# Patient Record
Sex: Female | Born: 1970 | Hispanic: Yes | Marital: Married | Smoking: Never smoker
Health system: Southern US, Community
[De-identification: ages and names within clinical notes are randomized; demographics above are authoritative.]

## PROBLEM LIST (undated history)

## (undated) DIAGNOSIS — D649 Anemia, unspecified: Secondary | ICD-10-CM

## (undated) DIAGNOSIS — J45909 Unspecified asthma, uncomplicated: Secondary | ICD-10-CM

## (undated) DIAGNOSIS — R51 Headache: Secondary | ICD-10-CM

## (undated) DIAGNOSIS — F32A Depression, unspecified: Secondary | ICD-10-CM

## (undated) DIAGNOSIS — F419 Anxiety disorder, unspecified: Secondary | ICD-10-CM

## (undated) DIAGNOSIS — N946 Dysmenorrhea, unspecified: Secondary | ICD-10-CM

## (undated) DIAGNOSIS — E162 Hypoglycemia, unspecified: Secondary | ICD-10-CM

## (undated) DIAGNOSIS — I10 Essential (primary) hypertension: Secondary | ICD-10-CM

## (undated) DIAGNOSIS — T7840XA Allergy, unspecified, initial encounter: Secondary | ICD-10-CM

## (undated) HISTORY — DX: Depression, unspecified: F32.A

## (undated) HISTORY — PX: ABDOMINAL HYSTERECTOMY: SHX81

## (undated) HISTORY — PX: TUBAL LIGATION: SHX77

## (undated) HISTORY — DX: Dysmenorrhea, unspecified: N94.6

## (undated) HISTORY — DX: Allergy, unspecified, initial encounter: T78.40XA

## (undated) HISTORY — DX: Unspecified asthma, uncomplicated: J45.909

## (undated) HISTORY — DX: Anxiety disorder, unspecified: F41.9

---

## 2009-04-27 ENCOUNTER — Emergency Department (HOSPITAL_COMMUNITY): Admission: EM | Admit: 2009-04-27 | Discharge: 2009-04-27 | Payer: Self-pay | Admitting: Emergency Medicine

## 2010-07-23 ENCOUNTER — Emergency Department (HOSPITAL_COMMUNITY): Admission: EM | Admit: 2010-07-23 | Discharge: 2010-07-23 | Payer: Self-pay | Admitting: Emergency Medicine

## 2010-12-29 LAB — POCT PREGNANCY, URINE: Preg Test, Ur: NEGATIVE

## 2010-12-29 LAB — POCT I-STAT, CHEM 8
Calcium, Ion: 1.11 mmol/L — ABNORMAL LOW (ref 1.12–1.32)
HCT: 37 % (ref 36.0–46.0)
TCO2: 24 mmol/L (ref 0–100)

## 2010-12-29 LAB — GC/CHLAMYDIA PROBE AMP, GENITAL
Chlamydia, DNA Probe: NEGATIVE
GC Probe Amp, Genital: NEGATIVE

## 2010-12-29 LAB — URINALYSIS, ROUTINE W REFLEX MICROSCOPIC
Bilirubin Urine: NEGATIVE
Ketones, ur: NEGATIVE mg/dL
Nitrite: NEGATIVE
Urobilinogen, UA: 0.2 mg/dL (ref 0.0–1.0)

## 2010-12-29 LAB — WET PREP, GENITAL: Clue Cells Wet Prep HPF POC: NONE SEEN

## 2011-12-05 ENCOUNTER — Encounter (HOSPITAL_COMMUNITY): Payer: Self-pay | Admitting: *Deleted

## 2011-12-05 ENCOUNTER — Inpatient Hospital Stay (HOSPITAL_COMMUNITY): Payer: BC Managed Care – PPO

## 2011-12-05 ENCOUNTER — Inpatient Hospital Stay (HOSPITAL_COMMUNITY)
Admission: AD | Admit: 2011-12-05 | Discharge: 2011-12-05 | Disposition: A | Discharge status: Home / Self Care | Payer: BC Managed Care – PPO | Source: Ambulatory Visit | Attending: Obstetrics & Gynecology | Admitting: Obstetrics & Gynecology

## 2011-12-05 DIAGNOSIS — N938 Other specified abnormal uterine and vaginal bleeding: Secondary | ICD-10-CM | POA: Insufficient documentation

## 2011-12-05 DIAGNOSIS — N926 Irregular menstruation, unspecified: Secondary | ICD-10-CM

## 2011-12-05 DIAGNOSIS — N949 Unspecified condition associated with female genital organs and menstrual cycle: Secondary | ICD-10-CM | POA: Insufficient documentation

## 2011-12-05 DIAGNOSIS — D649 Anemia, unspecified: Secondary | ICD-10-CM | POA: Insufficient documentation

## 2011-12-05 DIAGNOSIS — R109 Unspecified abdominal pain: Secondary | ICD-10-CM | POA: Insufficient documentation

## 2011-12-05 HISTORY — DX: Anemia, unspecified: D64.9

## 2011-12-05 HISTORY — DX: Headache: R51

## 2011-12-05 LAB — URINALYSIS, ROUTINE W REFLEX MICROSCOPIC
Glucose, UA: NEGATIVE mg/dL
Ketones, ur: NEGATIVE mg/dL
Leukocytes, UA: NEGATIVE
Specific Gravity, Urine: 1.02 (ref 1.005–1.030)
pH: 6 (ref 5.0–8.0)

## 2011-12-05 LAB — URINE MICROSCOPIC-ADD ON

## 2011-12-05 LAB — CBC
Hemoglobin: 9.1 g/dL — ABNORMAL LOW (ref 12.0–15.0)
MCH: 23.6 pg — ABNORMAL LOW (ref 26.0–34.0)
RBC: 3.85 MIL/uL — ABNORMAL LOW (ref 3.87–5.11)

## 2011-12-05 LAB — POCT PREGNANCY, URINE: Preg Test, Ur: NEGATIVE

## 2011-12-05 LAB — WET PREP, GENITAL: Trich, Wet Prep: NONE SEEN

## 2011-12-05 MED ORDER — MEDROXYPROGESTERONE ACETATE 10 MG PO TABS: 10.0000 mg | ORAL_TABLET | Freq: Two times a day (BID) | ORAL | Status: DC

## 2011-12-05 MED ORDER — KETOROLAC TROMETHAMINE 60 MG/2ML IM SOLN
60.0000 mg | Freq: Once | INTRAMUSCULAR | Status: AC
  Administered 2011-12-05: 60 mg via INTRAMUSCULAR
  Filled 2011-12-05: qty 2

## 2011-12-05 MED ORDER — FERROUS GLUCONATE IRON 246 (28 FE) MG PO TABS: 246.0000 mg | ORAL_TABLET | Freq: Every day | ORAL | Status: DC

## 2011-12-05 NOTE — Progress Notes (Signed)
Patient states she started her last period on 1-24, went to see a doctor on 2-4 because still bleeding and was given Provera, Doxy and Ibuprofen 600mg . States the bleeding stopped on 2-6 then started again on 2-13. Has been changing pads every 2 hours, today every hour. Has been having bad abdominal cramping since the bleeding started.

## 2011-12-05 NOTE — ED Notes (Signed)
GYN clinic:  March 14 at 3 p.m.

## 2011-12-05 NOTE — ED Provider Notes (Signed)
History     CSN: [id number]  Arrival date & time 12/05/11  1008   None     Chief Complaint  Patient presents with  . Vaginal Bleeding   HPI Debbie [name] is a 41 y.o. female who presents to [facility]U for vaginal bleeding that started 3 weeks ago. She complains of feeling weak and dizzy and states that for the past few days she is saturating a large pad every hour. Her last pap smear was approximately 7 years ago in [place].   Past Medical History  Diagnosis Date  . Headache   . Anemia     Past Surgical History  Procedure Date  . Cesarean section   . Tubal ligation     Family History  Problem Relation Age of Onset  . Anesthesia problems Neg Hx     History  Substance Use Topics  . Smoking status: Never Smoker   . Smokeless tobacco: Never Used  . Alcohol Use: No    OB History    Grav Para Term Preterm Abortions TAB SAB Ect Mult Living   4 4 4  0 0 0 0 0 0 4      Review of Systems  Constitutional: Positive for fatigue. Negative for fever, chills and diaphoresis.  HENT: Negative for ear pain, congestion, sore throat, facial swelling, neck pain, neck stiffness, dental problem and sinus pressure.   Eyes: Negative for photophobia, pain and discharge.  Respiratory: Negative for cough, chest tightness and wheezing.   Cardiovascular: Negative.   Gastrointestinal: Positive for abdominal pain. Negative for nausea, vomiting, diarrhea, constipation and abdominal distention.  Genitourinary: Positive for vaginal bleeding and pelvic pain. Negative for dysuria, urgency, frequency, flank pain, vaginal discharge, difficulty urinating and vaginal pain.  Musculoskeletal: Negative for myalgias, back pain and gait problem.  Skin: Negative for color change and rash.  Neurological: Positive for dizziness and light-headedness. Negative for speech difficulty, weakness, numbness and headaches.  Psychiatric/Behavioral: Negative for confusion and agitation. The patient is not nervous/anxious.      Allergies  Review of patient's allergies indicates no known allergies.  Home Medications  No current outpatient prescriptions on file.  BP 132/81  Pulse 94  Temp(Src) 98.9 F (37.2 C) (Oral)  Resp 18  Ht 5' 0.5" (1.537 m)  Wt 160 lb 6.4 oz (72.757 kg)  BMI 30.81 kg/m2  SpO2 100%  LMP 11/09/2011  Physical Exam  Nursing note and vitals reviewed. Constitutional: She is oriented to person, place, and time. She appears well-developed and well-nourished. No distress.  Eyes: EOM are normal.  Neck: Neck supple.  Cardiovascular: Normal rate.   Pulmonary/Chest: Effort normal.  Abdominal: Soft. There is no tenderness.  Genitourinary:       External genitalia without lesions. Moderate blood vaginal vault. Mild CMT, bilateral adnexal tenderness. Uterus slightly enlarged, firm and irregular.  Musculoskeletal: Normal range of motion.  Neurological: She is alert and oriented to person, place, and time. No cranial nerve deficit.  Skin: Skin is warm and dry.  Psychiatric: She has a normal mood and affect. Her behavior is normal. Judgment and thought content normal.   Results for orders placed during the hospital encounter of 12/05/11 (from the past 24 hour(s))  CBC     Status: Abnormal   Collection Time   12/05/11 10:34 AM      Component Value Range   WBC 7.2  4.0 - 10.5 (K/uL)   RBC 3.85 (*) 3.87 - 5.11 (MIL/uL)   Hemoglobin 9.1 (*) 12.0 - 15.0 (g/dL)  HCT 29.7 (*) 36.0 - 46.0 (%)   MCV 77.1 (*) 78.0 - 100.0 (fL)   MCH 23.6 (*) 26.0 - 34.0 (pg)   MCHC 30.6  30.0 - 36.0 (g/dL)   RDW 33.8 (*) 25.0 - 15.5 (%)   Platelets 357  150 - 400 (K/uL)  URINALYSIS, ROUTINE W REFLEX MICROSCOPIC     Status: Abnormal   Collection Time   12/05/11 10:35 AM      Component Value Range   Color, Urine YELLOW  YELLOW    APPearance HAZY (*) CLEAR    Specific Gravity, Urine 1.020  1.005 - 1.030    pH 6.0  5.0 - 8.0    Glucose, UA NEGATIVE  NEGATIVE (mg/dL)   Hgb urine dipstick LARGE (*) NEGATIVE     Bilirubin Urine NEGATIVE  NEGATIVE    Ketones, ur NEGATIVE  NEGATIVE (mg/dL)   Protein, ur NEGATIVE  NEGATIVE (mg/dL)   Urobilinogen, UA 0.2  0.0 - 1.0 (mg/dL)   Nitrite NEGATIVE  NEGATIVE    Leukocytes, UA NEGATIVE  NEGATIVE   URINE MICROSCOPIC-ADD ON     Status: Abnormal   Collection Time   12/05/11 10:35 AM      Component Value Range   Squamous Epithelial / LPF FEW (*) RARE    RBC / HPF TOO NUMEROUS TO COUNT  <3 (RBC/hpf)  POCT PREGNANCY, URINE     Status: Normal   Collection Time   12/05/11 11:05 AM      Component Value Range   Preg Test, Ur NEGATIVE  NEGATIVE   WET PREP, GENITAL     Status: Abnormal   Collection Time   12/05/11 11:28 AM      Component Value Range   Yeast Wet Prep HPF POC NONE SEEN  NONE SEEN    Trich, Wet Prep NONE SEEN  NONE SEEN    Clue Cells Wet Prep HPF POC FEW (*) NONE SEEN    WBC, Wet Prep HPF POC NONE SEEN  NONE SEEN    US Transvaginal Non-ob  12/05/2011  *RADIOLOGY REPORT*  Clinical Data: Abnormal bleeding  TRANSABDOMINAL AND TRANSVAGINAL ULTRASOUND OF PELVIS Technique:  Both transabdominal and transvaginal ultrasound examinations of the pelvis were performed. Transabdominal technique was performed for global imaging of the pelvis including uterus, ovaries, adnexal regions, and pelvic cul-de-sac.  Comparison: None.   It was necessary to proceed with endovaginal exam following the transabdominal exam to visualize the endometrium.  Findings:  The uterus has a normal size and echotexture, measuring 11.4 x 5.0 x 5.5 cm.  Endometrial stripe is thin and homogeneous, measuring 10 mm in width.  Both ovaries have a normal size and appearance.  The right ovary measures 3.5 x 2.2 x 2.1 cm, and the left ovary measures 4.3 x 2.4 x 2.9 cm.  There are no adnexal masses or free pelvic fluid.  IMPRESSION: Normal study. No evidence of pelvic mass or other significant abnormality.  Original Report Authenticated By: Brandon Melnick, M.D.   US Pelvis Complete  12/05/2011   *RADIOLOGY REPORT*  Clinical Data: Abnormal bleeding  TRANSABDOMINAL AND TRANSVAGINAL ULTRASOUND OF PELVIS Technique:  Both transabdominal and transvaginal ultrasound examinations of the pelvis were performed. Transabdominal technique was performed for global imaging of the pelvis including uterus, ovaries, adnexal regions, and pelvic cul-de-sac.  Comparison: None.   It was necessary to proceed with endovaginal exam following the transabdominal exam to visualize the endometrium.  Findings:  The uterus has a normal size and echotexture, measuring 11.4  x 5.0 x 5.5 cm.  Endometrial stripe is thin and homogeneous, measuring 10 mm in width.  Both ovaries have a normal size and appearance.  The right ovary measures 3.5 x 2.2 x 2.1 cm, and the left ovary measures 4.3 x 2.4 x 2.9 cm.  There are no adnexal masses or free pelvic fluid.  IMPRESSION: Normal study. No evidence of pelvic mass or other significant abnormality.  Original Report Authenticated By: Brandon Melnick, M.D.   Assessment: Abnormal vaginal bleeding   Anemia  Plan:  Provera 20 mg po daily until follow up in GYN Clinic   Ibuprofen for cramping   Return as needed.  ED Course: discussed lab, u/s and clinical findings with Dr. Macon Large. Will have patient follow up in GYN Clinic for endometrial bioposy.  Procedures   MDM: Hgb 2 years ago 12.6          [facility], NP 12/05/11 1307

## 2011-12-06 LAB — GC/CHLAMYDIA PROBE AMP, GENITAL: GC Probe Amp, Genital: NEGATIVE

## 2011-12-28 ENCOUNTER — Ambulatory Visit (INDEPENDENT_AMBULATORY_CARE_PROVIDER_SITE_OTHER): Payer: BC Managed Care – PPO | Admitting: Physician Assistant

## 2011-12-28 ENCOUNTER — Other Ambulatory Visit (HOSPITAL_COMMUNITY)
Admission: RE | Admit: 2011-12-28 | Discharge: 2011-12-28 | Disposition: A | Discharge status: Home / Self Care | Payer: BC Managed Care – PPO | Source: Ambulatory Visit | Attending: Obstetrics and Gynecology | Admitting: Obstetrics and Gynecology

## 2011-12-28 ENCOUNTER — Encounter: Payer: Self-pay | Admitting: Physician Assistant

## 2011-12-28 VITALS — BP 129/82 | HR 90 | Temp 98.5°F | Ht 62.0 in | Wt 163.5 lb

## 2011-12-28 DIAGNOSIS — Z1231 Encounter for screening mammogram for malignant neoplasm of breast: Secondary | ICD-10-CM

## 2011-12-28 DIAGNOSIS — N949 Unspecified condition associated with female genital organs and menstrual cycle: Secondary | ICD-10-CM | POA: Insufficient documentation

## 2011-12-28 DIAGNOSIS — N938 Other specified abnormal uterine and vaginal bleeding: Secondary | ICD-10-CM

## 2011-12-28 DIAGNOSIS — Z01812 Encounter for preprocedural laboratory examination: Secondary | ICD-10-CM

## 2011-12-28 DIAGNOSIS — Z124 Encounter for screening for malignant neoplasm of cervix: Secondary | ICD-10-CM

## 2011-12-28 DIAGNOSIS — N939 Abnormal uterine and vaginal bleeding, unspecified: Secondary | ICD-10-CM

## 2011-12-28 DIAGNOSIS — Z01419 Encounter for gynecological examination (general) (routine) without abnormal findings: Secondary | ICD-10-CM

## 2011-12-28 DIAGNOSIS — Z1239 Encounter for other screening for malignant neoplasm of breast: Secondary | ICD-10-CM

## 2011-12-28 NOTE — Progress Notes (Signed)
Chief Complaint:  DUB  Debbie [name] is  41 y.o. [id number].  Patient's last menstrual period was 11/09/2011..  . Onset is described as ongoing and has been present for  3 years. Pt presents for endometrial biopsy and follow-up after MAU visit for heavy vaginal bleeding. Reports 3 year history of abnormally heavy bleeding 2-3 times per year that lasts from 1-2 months at a time.   Obstetrical/Gynecological History: OB History    Grav Para Term Preterm Abortions TAB SAB Ect Mult Living   4 4 4  0 0 0 0 0 0 4      Past Medical History: Past Medical History  Diagnosis Date  . Headache   . Anemia     Past Surgical History: Past Surgical History  Procedure Date  . Cesarean section   . Tubal ligation     Family History: Family History  Problem Relation Age of Onset  . Anesthesia problems Neg Hx     Social History: History  Substance Use Topics  . Smoking status: Never Smoker   . Smokeless tobacco: Never Used  . Alcohol Use: No    Allergies: No Known Allergies   (Not in a hospital admission)  Review of Systems - Negative except what has been reviewed in HPI  Physical Exam   Blood pressure 129/82, pulse 90, temperature 98.5 F (36.9 C), temperature source Oral, height 5\' 2"  (1.575 m), weight 163 lb 8 oz (74.163 kg), last menstrual period 11/09/2011.  General: General appearance - alert, well appearing, and in no distress, oriented to person, place, and time and overweight Mental status - alert, oriented to person, place, and time, normal mood, behavior, speech, dress, motor activity, and thought processes, affect appropriate to mood Abdomen - soft, nontender, nondistended, no masses or organomegaly Focused Gynecological Exam: VULVA: normal appearing vulva with no masses, tenderness or lesions, VAGINA: normal appearing vagina with normal color and discharge, no lesions, CERVIX: normal appearing cervix without discharge or lesions, UTERUS: uterus is normal size, shape,  consistency and nontender, ADNEXA: normal adnexa in size, nontender and no masses  Patient given informed consent, signed copy in the chart, time out was performed. Appropriate time out taken. . The patient was placed in the lithotomy position and the cervix brought into view with sterile speculum.  Portio of cervix cleansed x 2 with betadine swabs.  A tenaculum was placed in the anterior lip of the cervix.  The uterus was sounded for depth of 8.5cm. A pipelle was introduced to into the uterus, suction created,  and an endometrial sample was obtained. All equipment was removed and accounted for.  The patient tolerated the procedure well.    Imaging Studies:  US Transvaginal Non-ob  12/05/2011  *RADIOLOGY REPORT*  Clinical Data: Abnormal bleeding  TRANSABDOMINAL AND TRANSVAGINAL ULTRASOUND OF PELVIS Technique:  Both transabdominal and transvaginal ultrasound examinations of the pelvis were performed. Transabdominal technique was performed for global imaging of the pelvis including uterus, ovaries, adnexal regions, and pelvic cul-de-sac.  Comparison: None.   It was necessary to proceed with endovaginal exam following the transabdominal exam to visualize the endometrium.  Findings:  The uterus has a normal size and echotexture, measuring 11.4 x 5.0 x 5.5 cm.  Endometrial stripe is thin and homogeneous, measuring 10 mm in width.  Both ovaries have a normal size and appearance.  The right ovary measures 3.5 x 2.2 x 2.1 cm, and the left ovary measures 4.3 x 2.4 x 2.9 cm.  There are no adnexal masses or  free pelvic fluid.  IMPRESSION: Normal study. No evidence of pelvic mass or other significant abnormality.  Original Report Authenticated By: Brandon Melnick, M.D.     Assessment: 1. Pre-procedure lab exam    2. DUB (dysfunctional uterine bleeding)  Surgical pathology  3. Routine Papanicolaou smear  Cytology - PAP  4. Screening for breast cancer  MM Digital Screening     Plan: Continue provera as  prescribed Reviewed long-term management options to first include, if biopsy negative, OCPs, Depo, and mirena.  Pt desires OCPs. RTC in 3 weeks for results and rx OCPs with negative bx.  Felicia Bloomquist E. 12/28/2011,5:17 PM

## 2011-12-28 NOTE — Patient Instructions (Signed)
Endometrial Biopsy This is a test in which a tissue sample (a biopsy) is taken from inside the uterus (womb). It is then looked at by a specialist under a microscope to see if the tissue is normal or abnormal. The endometrium is the lining of the uterus. This test helps determine where you are in your menstrual cycle and how hormone levels are affecting the lining of the uterus. Another use for this test is to diagnose endometrial cancer, tuberculosis, polyps, or inflammatory conditions and to evaluate uterine bleeding. PREPARATION FOR TEST No preparation or fasting is necessary. NORMAL FINDINGS No pathologic conditions. Presence of "secretory-type" endometrium 3 to 5 days before to normal menstruation. Ranges for normal findings may vary among different laboratories and hospitals. You should always check with your doctor after having lab work or other tests done to discuss the meaning of your test results and whether your values are considered within normal limits. MEANING OF TEST  Your caregiver will go over the test results with you and discuss the importance and meaning of your results, as well as treatment options and the need for additional tests if necessary. OBTAINING THE TEST RESULTS It is your responsibility to obtain your test results. Ask the lab or department performing the test when and how you will get your results. Document Released: 02/02/2005 Document Revised: 09/21/2011 Document Reviewed: 09/11/2008 [facility] Patient Information 2012 [place], [place].Dysfunctional Uterine Bleeding Normally, menstrual periods begin between ages 97 to 61 in young women. A normal menstrual cycle/period may begin every 23 days up to 35 days and lasts from 1 to 7 days. Around 12 to 14 days before your menstrual period starts, ovulation (ovary produces an egg) occurs. When counting the time between menstrual periods, count from the first day of bleeding of the previous period to the first day of bleeding of  the next period. Dysfunctional (abnormal) uterine bleeding is bleeding that is different from a normal menstrual period. Your periods may come earlier or later than usual. They may be lighter, have blood clots or be heavier. You may have bleeding between periods, or you may skip one period or more. You may have bleeding after sexual intercourse, bleeding after menopause, or no menstrual period. CAUSES   Pregnancy (normal, miscarriage, tubal).   IUDs (intrauterine device, birth control).   Birth control pills.   Hormone treatment.   Menopause.   Infection of the cervix.   Blood clotting problems.   Infection of the inside lining of the uterus.   Endometriosis, inside lining of the uterus growing in the pelvis and other female organs.   Adhesions (scar tissue) inside the uterus.   Obesity or severe weight loss.   Uterine polyps inside the uterus.   Cancer of the vagina, cervix, or uterus.   Ovarian cysts or polycystic ovary syndrome.   Medical problems (diabetes, thyroid disease).   Uterine fibroids (noncancerous tumor).   Problems with your female hormones.   Endometrial hyperplasia, very thick lining and enlarged cells inside of the uterus.   Medicines that interfere with ovulation.   Radiation to the pelvis or abdomen.   Chemotherapy.  DIAGNOSIS   Your doctor will discuss the history of your menstrual periods, medicines you are taking, changes in your weight, stress in your life, and any medical problems you may have.   Your doctor will do a physical and pelvic examination.   Your doctor may want to perform certain tests to make a diagnosis, such as:   Pap test.   Blood  tests.   Cultures for infection.   CT scan.   Ultrasound.   Hysteroscopy.   Laparoscopy.   MRI.   Hysterosalpingography.   D and C.   Endometrial biopsy.  TREATMENT  Treatment will depend on the cause of the dysfunctional uterine bleeding (DUB). Treatment may  include:  Observing your menstrual periods for a couple of months.   Prescribing medicines for medical problems, including:   Antibiotics.   Hormones.   Birth control pills.   Removing an IUD (intrauterine device, birth control).   Surgery:   D and C (scrape and remove tissue from inside the uterus).   Laparoscopy (examine inside the abdomen with a lighted tube).   Uterine ablation (destroy lining of the uterus with electrical current, laser, heat, or freezing).   Hysteroscopy (examine cervix and uterus with a lighted tube).   Hysterectomy (remove the uterus).  HOME CARE INSTRUCTIONS   If medicines were prescribed, take exactly as directed. Do not change or switch medicines without consulting your caregiver.   Long term heavy bleeding may result in iron deficiency. Your caregiver may have prescribed iron pills. They help replace the iron that your body lost from heavy bleeding. Take exactly as directed.   Do not take aspirin or medicines that contain aspirin one week before or during your menstrual period. Aspirin may make the bleeding worse.   If you need to change your sanitary pad or tampon more than once every 2 hours, stay in bed with your feet elevated and a cold pack on your lower abdomen. Rest as much as possible, until the bleeding stops or slows down.   Eat well-balanced meals. Eat foods high in iron. Examples are:   Leafy green vegetables.   Whole-grain breads and cereals.   Eggs.   Meat.   Liver.   Do not try to lose weight until the abnormal bleeding has stopped and your blood iron level is back to normal. Do not lift more than ten pounds or do strenuous activities when you are bleeding.   For a couple of months, make note on your calendar, marking the start and ending of your period, and the type of bleeding (light, medium, heavy, spotting, clots or missed periods). This is for your caregiver to better evaluate your problem.  SEEK MEDICAL CARE IF:    You develop nausea (feeling sick to your stomach) and vomiting, dizziness, or diarrhea while you are taking your medicine.   You are getting lightheaded or weak.   You have any problems that may be related to the medicine you are taking.   You develop pain with your DUB.   You want to remove your IUD.   You want to stop or change your birth control pills or hormones.   You have any type of abnormal bleeding mentioned above.   You are over 24 years old and have not had a menstrual period yet.   You are 41 years old and you are still having menstrual periods.   You have any of the symptoms mentioned above.   You develop a rash.  SEEK IMMEDIATE MEDICAL CARE IF:   An oral temperature above 102 F (38.9 C) develops.   You develop chills.   You are changing your sanitary pad or tampon more than once an hour.   You develop abdominal pain.   You pass out or faint.  Document Released: 09/29/2000 Document Revised: 09/21/2011 Document Reviewed: 08/31/2009 [facility] Patient Information 2012 [place], [place].

## 2011-12-28 NOTE — Progress Notes (Signed)
Interpreter # [phone]

## 2012-01-18 ENCOUNTER — Encounter: Payer: Self-pay | Admitting: Physician Assistant

## 2012-01-18 ENCOUNTER — Ambulatory Visit (INDEPENDENT_AMBULATORY_CARE_PROVIDER_SITE_OTHER): Payer: BC Managed Care – PPO | Admitting: Physician Assistant

## 2012-01-18 VITALS — BP 120/87 | HR 98 | Temp 98.9°F | Ht 62.0 in | Wt 165.4 lb

## 2012-01-18 DIAGNOSIS — N926 Irregular menstruation, unspecified: Secondary | ICD-10-CM

## 2012-01-18 DIAGNOSIS — N938 Other specified abnormal uterine and vaginal bleeding: Secondary | ICD-10-CM

## 2012-01-18 MED ORDER — NORGESTIMATE-ETH ESTRADIOL 0.25-35 MG-MCG PO TABS: 1.0000 | ORAL_TABLET | Freq: Every day | ORAL | Status: DC

## 2012-01-18 NOTE — Patient Instructions (Signed)

## 2012-01-18 NOTE — Progress Notes (Signed)
History   Chief Complaint:  Results   Debbie [name] is  41 y.o. [id number] Patient's last menstrual period was 12/28/2011.Marland Kitchen Patient is here for endometrial bx results. Reports last bleeding started as spotting and lasted x 1 week and then progressed to period-like bleeding x 1 week. Denies cramping or heavy bleeding with clots.  General ROS:  negative    Physical Exam   Blood pressure 120/87, pulse 98, temperature 98.9 F (37.2 C), temperature source Oral, height 5\' 2"  (1.575 m), weight 165 lb 6.4 oz (75.025 kg), last menstrual period 12/28/2011.  Focused Gynecological Exam: examination not indicated  Labs: Endometrium, biopsy INACTIVE ENDOMETRIUM WITH STROMAL DECIDUAL CHANGE CONSISTENT WITH PROGESTATIONAL EFFECT. NO HYPERPLASIA OR CARCINOMA.  Assessment: DUB with negative endo biopsey   Plan: Discussed management options to include Depo, OCPs or Mirena. Pt opts for OCPs. No contraindications.  Rx sprintec sent to pharmacy. Pill education done. RTC 3 months for re-check or prn prob  Debbie [name] E. 01/18/2012, 3:26 PM

## 2012-01-18 NOTE — Progress Notes (Signed)
Pt states period started on 12/28/11 spotting on 01/04/12 started bleeding heavy and then period stop on 01/11/12.

## 2012-01-23 ENCOUNTER — Ambulatory Visit (HOSPITAL_COMMUNITY)
Admission: RE | Admit: 2012-01-23 | Discharge: 2012-01-23 | Disposition: A | Discharge status: Home / Self Care | Payer: BC Managed Care – PPO | Source: Ambulatory Visit | Attending: Physician Assistant | Admitting: Physician Assistant

## 2012-01-23 DIAGNOSIS — Z1231 Encounter for screening mammogram for malignant neoplasm of breast: Secondary | ICD-10-CM | POA: Insufficient documentation

## 2012-02-29 ENCOUNTER — Ambulatory Visit: Payer: BC Managed Care – PPO | Admitting: Gynecology

## 2012-03-04 ENCOUNTER — Ambulatory Visit (INDEPENDENT_AMBULATORY_CARE_PROVIDER_SITE_OTHER): Payer: BC Managed Care – PPO | Admitting: Gynecology

## 2012-03-04 ENCOUNTER — Encounter: Payer: Self-pay | Admitting: Gynecology

## 2012-03-04 VITALS — BP 120/78 | Ht 61.5 in | Wt 162.0 lb

## 2012-03-04 DIAGNOSIS — D649 Anemia, unspecified: Secondary | ICD-10-CM

## 2012-03-04 DIAGNOSIS — N946 Dysmenorrhea, unspecified: Secondary | ICD-10-CM

## 2012-03-04 DIAGNOSIS — N920 Excessive and frequent menstruation with regular cycle: Secondary | ICD-10-CM

## 2012-03-04 NOTE — Progress Notes (Signed)
Patient is a 41 year old gravida 4 para 4 who presented to the office today as a new patient has a result of her ongoing menorrhagia. Patient's had a previous tubal sterilization procedure at time of her last C-section. We reviewed her records and she had been in the emergency room back in April and was found to have a hemoglobin of 9.1 with hematocrit 29.7 and a platelet count of 357,000. She had a negative pregnancy test done at that time as well as a negative urinalysis a negative GC and Chlamydia culture. She had seen a doctor Mayford Knife who had done an endometrial biopsy recently with the following result:  Diagnosis Endometrium, biopsy INACTIVE ENDOMETRIUM WITH STROMAL DECIDUAL CHANGE CONSISTENT WITH PROGESTATIONAL EFFECT. NO HYPERPLASIA OR CARCINOMA.  Her recent Pap smear was also normal as well. He placed on Necon 14/50. Prior to that in the emergency room been placed on Provera 20 mg daily for 10 days until she had gone to see Dr. Mayford Knife. Since patient has been on the oral contraceptive pill her bleeding has stopped. She's here today for further evaluation and treatment.  Exam: Abdomen: Soft nontender no rebound or guarding Pelvic: Bartholin urethra Skene was within normal limits Vagina: No lesions or discharge Cervix: No lesions or discharge Uterus: Anteverted normal size shape and consistency Adnexa no palpable masses or tenderness Rectal exam: Not done  Assessment/plan: Patient with menorrhagia will return back to the office next week for sonohysterogram to complete evaluation. I've given her literature information on the her option endometrial ablation technique as well as the Mirena IUD. We will check her CBC next week and discuss one of the 2 options depending on the results of the sonohysterogram. All the above was discussed in Spanish and we'll follow accordingly. Patient was reminded to continue to take her iron  supplementation.

## 2012-03-04 NOTE — Patient Instructions (Signed)
Saline Infusion Hysterogram  Saline infusion sonohysterography is a procedure in which fluid is instilled into the uterine cavity transcervically to provide enhanced endometrial visualization during transvaginal ultrasound examination. The [facility]'s standard indications and contraindications to this procedure are shown in the tableIn women with abnormal uterine bleeding in whom endometrial sampling is indicated, a clinician may obtain saline instillation sonohysterography prior to the sampling procedure. Blind sampling procedures are most useful if an abnormality is symmetrically 'pan uterine'. When focal lesions (eg, polyps, fibroids, some hyperplasias, some carcinomas) are detected, then directed biopsies are preferable.Saline infusion sonohysterography is useful for detecting potential anatomic causes of reduced fertility, such as submucous myomas, endometrial polyps, anomalies, and intrauterine adhesions. The examination is scheduled when the endometrium will be as thin as possible from the last day or two of staining until three to four days after the bleeding has ended. We recommend that the balloon catheter be inserted intracervically rather than intrauterine into the uterus to minimize pain and saline volume.  The risk of upper genital tract infection is similar to that with a traditional hysterosalpingogram. Whether cultures are obtained and prophylactic antibiotics prescribed depends upon the patient's risk factors and clinician preference. False positive findings have been attributed to blood clots, intrauterine debris, mucus plugs, shearing of normal endometrium, thickened endometrial folds, and misidentified endometrial fragments. Procedure-related side effects and complications are mild and uncommon

## 2012-03-07 ENCOUNTER — Other Ambulatory Visit: Payer: Self-pay | Admitting: Gynecology

## 2012-03-07 DIAGNOSIS — N920 Excessive and frequent menstruation with regular cycle: Secondary | ICD-10-CM

## 2012-03-13 ENCOUNTER — Ambulatory Visit (INDEPENDENT_AMBULATORY_CARE_PROVIDER_SITE_OTHER): Payer: BC Managed Care – PPO | Admitting: Gynecology

## 2012-03-13 ENCOUNTER — Ambulatory Visit (INDEPENDENT_AMBULATORY_CARE_PROVIDER_SITE_OTHER): Payer: BC Managed Care – PPO

## 2012-03-13 DIAGNOSIS — N920 Excessive and frequent menstruation with regular cycle: Secondary | ICD-10-CM

## 2012-03-13 DIAGNOSIS — N939 Abnormal uterine and vaginal bleeding, unspecified: Secondary | ICD-10-CM

## 2012-03-13 DIAGNOSIS — D251 Intramural leiomyoma of uterus: Secondary | ICD-10-CM

## 2012-03-13 DIAGNOSIS — D259 Leiomyoma of uterus, unspecified: Secondary | ICD-10-CM

## 2012-03-13 DIAGNOSIS — N854 Malposition of uterus: Secondary | ICD-10-CM

## 2012-03-13 DIAGNOSIS — D649 Anemia, unspecified: Secondary | ICD-10-CM | POA: Insufficient documentation

## 2012-03-13 DIAGNOSIS — N938 Other specified abnormal uterine and vaginal bleeding: Secondary | ICD-10-CM

## 2012-03-13 LAB — CBC WITH DIFFERENTIAL/PLATELET
Basophils Absolute: 0 10*3/uL (ref 0.0–0.1)
Basophils Relative: 1 % (ref 0–1)
Eosinophils Absolute: 0.2 10*3/uL (ref 0.0–0.7)
Eosinophils Relative: 2 % (ref 0–5)
MCH: 25.5 pg — ABNORMAL LOW (ref 26.0–34.0)
MCHC: 32.7 g/dL (ref 30.0–36.0)
MCV: 77.8 fL — ABNORMAL LOW (ref 78.0–100.0)
Neutrophils Relative %: 53 % (ref 43–77)
Platelets: 390 10*3/uL (ref 150–400)
RDW: 16.9 % — ABNORMAL HIGH (ref 11.5–15.5)

## 2012-03-13 LAB — PROLACTIN: Prolactin: 8.6 ng/mL

## 2012-03-13 NOTE — Progress Notes (Signed)
Patient is a 41 year old gravida 4 para 4 who presented to the office today for sonohysterogram as part of her evaluation for her menorrhagia. Patient's had a previous tubal sterilization procedure at time of her last C-section. We reviewed her records and she had been in the emergency room back in April and was found to have a hemoglobin of 9.1 with hematocrit 29.7 and a platelet count of 357,000. She had a negative pregnancy test done at that time as well as a negative urinalysis a negative GC and Chlamydia culture. She had seen a doctor Mayford Knife who had done an endometrial biopsy recently with the following result:   Diagnosis  Endometrium, biopsy  INACTIVE ENDOMETRIUM WITH STROMAL DECIDUAL CHANGE CONSISTENT WITH  PROGESTATIONAL EFFECT. NO HYPERPLASIA OR CARCINOMA.   Her recent Pap smear was also normal as well. He placed on Necon 14/50. Prior to that in the emergency room been placed on Provera 20 mg daily for 10 days until she had gone to see Dr. Mayford Knife. Since patient has been on the oral contraceptive pill her bleeding has stopped  Ultrasound for/sonohysterogram: Uterus measured 13.6 x 7.2 x 5.2 cm with an endometrial stripe 4.3 mm. She had 2 small intramural myomas the largest one measuring 17 x 40 mm. Right ovary was normal left ovary with follicles noted. Cul-de-sac was negative. Sonohysterogram no intracavitary defects.  Patient was previously provided with literature information on the her option endometrial ablation technique or the Mirena IUD. She was to continue on her iron supplementation and we are going to be checking her CBC today along with a TSH and prolactin level. We will check with insurance coverage and schedule the ablation accordingly. If not she will return to either to have the Mirena IUD placed or she can continue on the oral contraceptive pill. We did discuss the importance of compliance and she were to stay on the oral contraceptive pill. She will stay on iron  supplementation until further notice.

## 2012-03-14 ENCOUNTER — Telehealth: Payer: Self-pay | Admitting: Gynecology

## 2012-03-14 NOTE — Telephone Encounter (Signed)
Patient informed by Jerilynn Mages in Spanish that I had checked her insurance benefits and that Mirena IUD will cost her [id number] and Her Option Ablation will cost her $[phone].  Patient said that financially these are not options she can consider and she elects to continue with her OC's at this time.

## 2012-03-20 ENCOUNTER — Ambulatory Visit: Payer: BC Managed Care – PPO | Admitting: Physician Assistant

## 2013-08-25 ENCOUNTER — Ambulatory Visit (INDEPENDENT_AMBULATORY_CARE_PROVIDER_SITE_OTHER): Payer: BC Managed Care – PPO | Admitting: Gynecology

## 2013-08-25 ENCOUNTER — Encounter: Payer: Self-pay | Admitting: Gynecology

## 2013-08-25 ENCOUNTER — Other Ambulatory Visit (HOSPITAL_COMMUNITY)
Admission: RE | Admit: 2013-08-25 | Discharge: 2013-08-25 | Disposition: A | Discharge status: Home / Self Care | Payer: BC Managed Care – PPO | Source: Ambulatory Visit | Attending: Gynecology | Admitting: Gynecology

## 2013-08-25 VITALS — BP 126/78 | Ht 60.25 in | Wt 160.0 lb

## 2013-08-25 DIAGNOSIS — Z01419 Encounter for gynecological examination (general) (routine) without abnormal findings: Secondary | ICD-10-CM | POA: Insufficient documentation

## 2013-08-25 DIAGNOSIS — N921 Excessive and frequent menstruation with irregular cycle: Secondary | ICD-10-CM

## 2013-08-25 DIAGNOSIS — Z1151 Encounter for screening for human papillomavirus (HPV): Secondary | ICD-10-CM | POA: Insufficient documentation

## 2013-08-25 DIAGNOSIS — N946 Dysmenorrhea, unspecified: Secondary | ICD-10-CM

## 2013-08-25 DIAGNOSIS — D259 Leiomyoma of uterus, unspecified: Secondary | ICD-10-CM | POA: Insufficient documentation

## 2013-08-25 DIAGNOSIS — N923 Ovulation bleeding: Secondary | ICD-10-CM

## 2013-08-25 DIAGNOSIS — N920 Excessive and frequent menstruation with regular cycle: Secondary | ICD-10-CM

## 2013-08-25 LAB — CHOLESTEROL, TOTAL: Cholesterol: 168 mg/dL (ref 0–200)

## 2013-08-25 LAB — COMPREHENSIVE METABOLIC PANEL
ALT: 12 U/L (ref 0–35)
AST: 15 U/L (ref 0–37)
Albumin: 4.3 g/dL (ref 3.5–5.2)
Alkaline Phosphatase: 67 U/L (ref 39–117)
BUN: 11 mg/dL (ref 6–23)
Potassium: 4 mEq/L (ref 3.5–5.3)

## 2013-08-25 MED ORDER — NORETHIN ACE-ETH ESTRAD-FE 1-20 MG-MCG PO TABS: 1.0000 | ORAL_TABLET | Freq: Every day | ORAL | Status: DC

## 2013-08-25 NOTE — Progress Notes (Signed)
Debbie [name] [birth year] [id number]   History:    42 y.o.  for annual gyn exam as well as complaining of 2 cycles per month. Also she has history of menorrhagia or by her cycles last 7-10 days and very heavy along with heavy cramping. She was evaluated 18 months ago here in the office and had an ultrasound and sonohysterogram and she was a candidate for an endometrial ablation or the Mirena IUD but due to the fact that her insurance would not cover it she did not return until now. Patient's had previous tubal sterilization procedure. The patient denies any past history of abnormal Pap smear. Patient's both parents have diabetes.  Patient had an endometrial biopsy done or 18 months ago at another facility and the report was as follows: Endometrium, biopsy  INACTIVE ENDOMETRIUM WITH STROMAL DECIDUAL CHANGE CONSISTENT WITH  PROGESTATIONAL EFFECT. NO HYPERPLASIA OR CARCINOMA  Patient declined flu vaccine today  Past medical history,surgical history, family history and social history were all reviewed and documented in the EPIC chart.  Gynecologic History Patient's last menstrual period was 08/07/2013. Contraception: tubal ligation Last Pap: 2013. Results were: normal Last mammogram: 2013. Results were: normal  Obstetric History OB History  Gravida Para Term Preterm AB SAB TAB Ectopic Multiple Living  4 4 4  0 0 0 0 0 0 4    # Outcome Date GA Lbr Len/2nd Weight Sex Delivery Anes PTL Lv  4 TRM     F LTCS  N Y  3 TRM     M LTCS  N Y     Comments: fetal heart beat went down  2 TRM     F SVD  N Y  1 TRM     F SVD  N Y       ROS: A ROS was performed and pertinent positives and negatives are included in the history.  GENERAL: No fevers or chills. HEENT: No change in vision, no earache, sore throat or sinus congestion. NECK: No pain or stiffness. CARDIOVASCULAR: No chest pain or pressure. No palpitations. PULMONARY: No shortness of breath, cough or wheeze. GASTROINTESTINAL: No abdominal pain,  nausea, vomiting or diarrhea, melena or bright red blood per rectum. GENITOURINARY: No urinary frequency, urgency, hesitancy or dysuria. MUSCULOSKELETAL: No joint or muscle pain, no back pain, no recent trauma. DERMATOLOGIC: No rash, no itching, no lesions. ENDOCRINE: No polyuria, polydipsia, no heat or cold intolerance. No recent change in weight. HEMATOLOGICAL: No anemia or easy bruising or bleeding. NEUROLOGIC: No headache, seizures, numbness, tingling or weakness. PSYCHIATRIC: No depression, no loss of interest in normal activity or change in sleep pattern.     Exam: chaperone present  BP 126/78  Ht 5' 0.25" (1.53 m)  Wt 160 lb (72.576 kg)  BMI 31.00 kg/m2  LMP 08/07/2013  Body mass index is 31 kg/(m^2).  General appearance : Well developed well nourished female. No acute distress HEENT: Neck supple, trachea midline, no carotid bruits, no thyroidmegaly Lungs: Clear to auscultation, no rhonchi or wheezes, or rib retractions  Heart: Regular rate and rhythm, no murmurs or gallops Breast:Examined in sitting and supine position were symmetrical in appearance, no palpable masses or tenderness,  no skin retraction, no nipple inversion, no nipple discharge, no skin discoloration, no axillary or supraclavicular lymphadenopathy Abdomen: no palpable masses or tenderness, no rebound or guarding Extremities: no edema or skin discoloration or tenderness  Pelvic:  Bartholin, Urethra, Skene Glands: Within normal limits  Vagina: No gross lesions or discharge  Cervix: No gross lesions or discharge  Uterus  Irregular shaped approximately 12-14 week size  Adnexa  Difficult to evaluate  Anus and perineum  normal   Rectovaginal  normal sphincter tone without palpated masses or tenderness             Hemoccult not indicated     Assessment/Plan:  42 y.o. female for annual exam with history of menorrhagia and recent the having 2 periods per month. Patient was counseled for endometrial biopsy.  The cervix was cleansed with Betadine solution and a sterile pipette was introduced into the uterine cavity and tissue obtained was submitted for histological evaluation. Prior to that a Pap smear was done today as well. We will have the patient return back to the office for a sonohysterogram to rule out any intrauterine pathology and also to compare the ultrasound from previous 18 months ago to see if there's been any substantial growth of her fibroid uterus. We had discussed putting her on a low-dose 20 mcg oral contraceptive pill to regulate her cycles. Patient stated that she had been on oral contraceptive pills in the past and had no problems. She does not smoke and has no family history or personal history of blood clots or any bleeding disorder. The following labs were ordered: CBC, compress metabolic panel, screen cholesterol, TSH and urinalysis. All the above instructions were provided in Spanish.  Note: This dictation was prepared with  Dragon/digital dictation along withSmart phrase technology. Any transcriptional errors that result from this process are unintentional.   Ok Edwards MD, 5:13 PM 08/25/2013

## 2013-08-25 NOTE — Patient Instructions (Addendum)
Biopsia de endometrio (Endometrial Biopsy) La biopsia de endometrio es un procedimiento en el que se toma una [place] de tejido del tero. Luego la muestra de tejido se observa en el microscopio para ver si el tejido es normal o anormal. El endometrio es el revestimiento interno del tero. Este procedimiento ayuda a determinar si est en el ciclo menstrual y de que modo los niveles de hormonas afectan el revestimiento del tero. Este procedimiento tambin se [place] para evaluar el sangrado uterino o para Arts administrator de endometrio, tuberculosis, plipos o enfermedades inflamatorias.  INFORME A SU MDICO:  Cualquier alergia que tenga.  Todos los [employer] [place], incluyendo vitaminas, hierbas, gotas oftlmicas, cremas y [address] de [address].  Problemas previos que usted o los [employer] de su familia hayan tenido con el uso de anestsicos.  Enfermedades de [employer].  Cirugas previas.  Padecimientos mdicos.  Posible embarazo. RIESGOS Y COMPLICACIONES Generalmente es un procedimiento seguro. Sin embargo, Tree surgeon procedimiento, pueden surgir complicaciones. Las complicaciones posibles son:  Hemorragias.  Infecciones plvicas  Lesin en la pared del tero con el instrumento utilizado para tomar la biopsia (raro). ANTES DEL PROCEDIMIENTO   Lleve un registro de sus ciclos menstruales segn las indicaciones de su mdico. Puede ser necesario que programe el procedimiento para un momento especfico del ciclo menstrual.  Tendr que llevar un apsito sanitario para usar despus del procedimiento.  Pdale a alguna persona que la lleve a su casa despus del procedimiento si le dan un medicamento para relajarse (sedante). PROCEDIMIENTO   Le podrn administrar un medicamento para relajarse.  Deber recostarse en una camilla con los pies y las piernas elevados, como en el examen plvico.  El mdico insertar un instrumento (espculo) en la vagina para observar  el cuello del tero.  El cuello del tero ser desinfectado con una solucin antisptica. Para adormecer el cuello del tero le aplicarn un medicamento (anestsico local ).  Se utilizar un frceps (tenculo) para Radio producer [employer]	[place].  Se insertar un instrumento delgado, similar a una varilla (sonda uterina) a travs del cuello del tero para Chief Strategy Officer su longitud y la ubicacin en la que ser tomada la muestra para la biopsia.  Luego se pasa un tubo delgado y flexible (catter) a travs del cuello del tero hasta el tero. El catter se [place] para Building control surveyor de tejido del endometrio para la biopsia.  El catter y el espculo se retirarn y la [place] se enviar al laboratorio para ser examinada. DESPUS DEL PROCEDIMIENTO  Descansar en una sala de recuperacin hasta que est lista para volver a su casa.  Sentir clicos leves y tendr una pequea cantidad de sangrado vaginal durante algunos das despus del procedimiento. Esto es normal.  Asegrese de [employer]. Document Released: 06/04/2013 ExitCare Patient Information 2014 ExitCare,   Fibroma uterino  (Uterine Fibroid)  Un fibroma uterino es un crecimiento (tumor) dentro del tero de [place]. Este tipo de tumor no es Insurance risk surveyor y no se extiende fuera del tero. Una mujer puede tener uno o varios fibromas y algunos pueden ser bastante grandes. Un fibroma puede variar en tamao, peso y Immunologist en que se desarrolla dentro del tero. La mayora de los fibromas no necesitan tratamiento mdico, pero algunos pueden causar dolor o sangrado abundante durante los perodos y [place]. CAUSAS  Un fibroma es el resultado del desarrollo continuo de una nica clula uterina que sigue creciendo (no regulada) que es diferente al resto de las  clulas del cuerpo humano. La mayora de las clulas tiene un mecanismo de control que evita que se reproduzcan de Psychologist, occupational.  SNTOMAS   Sangrado.  Dolor y  sensacin de presin en la pelvis.  Problemas en la vejiga debido al tamao del fibroma.  Infertilidad y abortos espontneos, segn el tamao y la ubicacin del fibroma. DIAGNSTICO  El diagnstico se hace por examen fsico. El mdico puede palpar los tumores abultados al realizar el examen de la pelvis. Una ecografa puede brindar informacin adicional importante acerca del tamao, la ubicacin y el nmero de tumores. Es raro que sea Passenger transport manager otras pruebas, como tomografa computada o Health visitor.  TRATAMIENTO   Su mdico puede considerar que es conveniente esperar y Pharmacologist. Esto incluye el control del fibroma por parte del mdico para observar si crece o disminuye su tamao.   Podr recomendarle un tratamiento hormonal o el uso de un dispositivo intrauterino (DIU).   En algunos casos es necesaria la ciruga para extirpar el fibroma (miomectoma) o el tero (histerectoma). Esto depender de su situacin. Cuando una mujer desea quedar embarazada y los fibromas interfieren en su fertilidad, el mdico puede recomendar la extirpacin del fibroma.  INSTRUCCIONES PARA EL CUIDADO EN EL HOGAR  Los cuidados en el hogar dependen del tratamiento que haya recibido. En general:   Concurra puntualmente a las citas de control con el mdico.   Slo tome los medicamentos que le indic su mdico. No tome aspirina. Puede ocasionar hemorragias.   Si tiene perodos muy abundantes y debe cambiar un tampn o una toalla higinica en media hora o menos, comunquese con su mdico inmediatamente. Si sus perodos son molestos pero no tan abundantes, acustese con los pies ligeramente elevados por encima del nivel del corazn. Coloque compresas fras en la zona inferior del abdomen.   Si sus perodos son muy abundantes  , anote el nmero de compresas o tampones que [place] cada mes. Lleve esta informacin cuando visite a su mdico.   Consulte al mdico si debe tomar pldoras de hierro.    Incluya vegetales en su dieta.   Si le recetaron un tratamiento hormonal, tome los medicamentos hormonales como le indicaron.   Si le indicaron la ciruga, pdale informacin especfica a su mdico.  SOLICITE ATENCIN MDICA DE INMEDIATO SI:   Siente dolor o clicos en la pelvis y no puede controlarlos con los medicamentos.   El dolor en la pelvis aumenta de manera repentina.   Aumenta el sangrado entre los perodos o [employer].   Se siente mareado o tiene episodios de [place].  ASEGRESE DE QUE:   Comprende estas instrucciones.  Controlar su enfermedad.  Solicitar ayuda de inmediato si no mejora o si empeora. Document Released: 10/02/2005 Document Revised: 12/25/2011 [facility] Patient Information 2014 [place], [place]. Uso de los Civil Service fast streamer (Oral Contraception Use) Los anticonceptivos orales (ACO) son medicamentos que se utilizan para Location manager. Su funcin es [employer] ovarios liberen vulos. Las hormonas de los ACO tambin hacen que el moco cervical se haga ms espeso, lo que evita que el esperma ingrese al tero. Tambin hacen que la membrana que recubre internamente al tero se vuelva ms fina, lo que no permite que el huevo fertilizado se adhiera a la pared del tero. Los ACO son muy efectivos cuando se toman exactamente como se prescriben. Sin embargo, no previenen contra las enfermedades de transmisin sexual (ETS). La prctica del sexo seguro, como el uso de preservativos, junto con los  ACO, ayudan a prevenir ese tipo de enfermedades. Antes de tomar ACO, debe hacerse un examen fsico y un [place]. El mdico podr indicarle anlisis de [place], si es necesario. El mdico se asegurar de que usted sea [place] buena candidata para usar anticonceptivos orales. Converse con su mdico acerca de los posibles efectos secundarios de los ACO que podran recetarle. Cuando se inicia el uso de ACO, se pueden tomar durante 2 a 3 meses para que el  cuerpo se adapte a los cambios en los niveles hormonales en el cuerpo.  CMO TOMAR LOS ANTICONCEPTIVOS ORALES El mdico le indicar como comenzar a Building services engineer de ACO. De lo contrario usted puede:   Engineering geologist de inicio del ciclo menstrual. No necesitar proteccin anticonceptiva adicional al Investment banker, operational.   Comenzar Financial risk analyst domingo luego de su perodo menstrual, o Medical laboratory scientific officer en que adquiere el [employer]. En estos casos deber [employer] proteccin anticonceptiva [employer] primeros 7 das del [place].   Comenzar a tomarlos en cualquier momento del ciclo. Si toma el anticonceptivo dentro de los [address] de iniciado el perodo, Theme park manager protegida de quedar embarazada inmediatamente. En este caso, no necesitar una forma adicional de anticonceptivos. Si comienza en cualquier otro momento del ciclo menstrual, necesitar usar otra forma de anticonceptivo durante [address]  [address]. Si sus ACO son del tipo de los [employer], podrn impedir el embarazo despus de tomarlas por 2 das (48 horas). Luego de comenzar a tomar los ACO:   Si olvid de tomar 1 pldora, tmela tan pronto como lo recuerde. Tome la siguiente pldora a la hora habitual.   Si dej de tomar 2 o ms pldoras, comunquese con su mdico ya que diferentes pldoras tienen diferentes instrucciones para las dosis que no se han tomado. Si olvida tomar 2 o ms pldoras, utilice un mtodo anticonceptivo adicional hasta que comience su prximo perodo menstrual.   Si utiliza el envase de 28 pldoras que contienen pldoras inactivas y [place] tomar 1 de las ltimas 7 (pldoras sin hormonas), sto no tiene [place]ar[place] manager. Simplemente deseche el resto de las pldoras que no contienen hormonas y comience un nuevo envase.  No importa cuando comience a tomar los anticonceptivos, siempre empiece un nuevo envase el mismo da de la [place]. Tenga un envase extra de ACO y use un mtodo anticonceptivo adicional para Restaurant manager, fast food en que se  olvide de tomar algunas pldoras o pierda la caja.  INSTRUCCIONES PARA EL CUIDADO EN EL HOGAR   No fume.   Use siempre un condn para protegerse contra las enfermedades de transmisin sexual. Los ACO no protegen contra las enfermedades de transmisin sexual.   Use un almanaque para [employer] de su perodo menstrual.   Lea la informacin y consejos que vienen con las ACO. Hable con el profesional si tiene dudas.  SOLICITE ATENCIN MDICA SI:   Presenta nuseas o vmitos.   Tiene flujo o sangrado vaginal anormal.   Aparece una erupcin cutnea.   No tiene el perodo menstrual.   Pierde el cabello.   Necesita tratamiento por cambios en su estado de nimo o por depresin.   Se siente mareada al [employer].   Comienza a aparecer acn con el uso de los ACO.   Ardelle Anton.  SOLICITE ATENCIN MDICA DE INMEDIATO SI:   Siente dolor en el pecho.   Le falta el aire.   Le duele mucho la cabeza y no puede Human resources officer.   Siente adormecimiento  o tiene dificultad para hablar.   Tiene problemas de visin.   Presenta dolor, inflamacin o hinchazn en las piernas.  Document Released: 09/21/2011 Document Revised: 06/04/2013 [facility] Patient Information 2014 [place], [place].

## 2013-08-26 LAB — URINALYSIS W MICROSCOPIC + REFLEX CULTURE
Casts: NONE SEEN
Hgb urine dipstick: NEGATIVE
Ketones, ur: NEGATIVE mg/dL
Leukocytes, UA: NEGATIVE
Nitrite: NEGATIVE
Protein, ur: NEGATIVE mg/dL
Urobilinogen, UA: 0.2 mg/dL (ref 0.0–1.0)
pH: 6 (ref 5.0–8.0)

## 2013-08-26 LAB — TSH: TSH: 3.487 u[IU]/mL (ref 0.350–4.500)

## 2013-08-27 ENCOUNTER — Other Ambulatory Visit: Payer: Self-pay | Admitting: Gynecology

## 2013-08-27 DIAGNOSIS — D259 Leiomyoma of uterus, unspecified: Secondary | ICD-10-CM

## 2013-08-27 DIAGNOSIS — N923 Ovulation bleeding: Secondary | ICD-10-CM

## 2013-08-27 DIAGNOSIS — N920 Excessive and frequent menstruation with regular cycle: Secondary | ICD-10-CM

## 2013-09-10 ENCOUNTER — Ambulatory Visit: Payer: BC Managed Care – PPO | Admitting: Gynecology

## 2013-09-10 ENCOUNTER — Other Ambulatory Visit: Payer: BC Managed Care – PPO

## 2013-09-26 ENCOUNTER — Ambulatory Visit (INDEPENDENT_AMBULATORY_CARE_PROVIDER_SITE_OTHER): Payer: BC Managed Care – PPO | Admitting: Gynecology

## 2013-09-26 ENCOUNTER — Ambulatory Visit (INDEPENDENT_AMBULATORY_CARE_PROVIDER_SITE_OTHER): Payer: BC Managed Care – PPO

## 2013-09-26 ENCOUNTER — Encounter: Payer: Self-pay | Admitting: Gynecology

## 2013-09-26 DIAGNOSIS — D649 Anemia, unspecified: Secondary | ICD-10-CM

## 2013-09-26 DIAGNOSIS — N970 Female infertility associated with anovulation: Secondary | ICD-10-CM

## 2013-09-26 DIAGNOSIS — D259 Leiomyoma of uterus, unspecified: Secondary | ICD-10-CM

## 2013-09-26 DIAGNOSIS — N923 Ovulation bleeding: Secondary | ICD-10-CM

## 2013-09-26 DIAGNOSIS — N920 Excessive and frequent menstruation with regular cycle: Secondary | ICD-10-CM

## 2013-09-26 DIAGNOSIS — N921 Excessive and frequent menstruation with irregular cycle: Secondary | ICD-10-CM

## 2013-09-26 LAB — CBC WITH DIFFERENTIAL/PLATELET
Basophils Absolute: 0.1 10*3/uL (ref 0.0–0.1)
Basophils Relative: 1 % (ref 0–1)
Eosinophils Absolute: 0.1 10*3/uL (ref 0.0–0.7)
Hemoglobin: 10.5 g/dL — ABNORMAL LOW (ref 12.0–15.0)
MCH: 24.1 pg — ABNORMAL LOW (ref 26.0–34.0)
MCHC: 32 g/dL (ref 30.0–36.0)
Monocytes Relative: 7 % (ref 3–12)
Neutro Abs: 3.6 10*3/uL (ref 1.7–7.7)
Neutrophils Relative %: 55 % (ref 43–77)
Platelets: 457 10*3/uL — ABNORMAL HIGH (ref 150–400)
RDW: 17 % — ABNORMAL HIGH (ref 11.5–15.5)
WBC: 6.5 10*3/uL (ref 4.0–10.5)

## 2013-09-26 NOTE — Progress Notes (Signed)
Patient presented to the office today for a sonohysterogram as part of her evaluation for her dysfunctional uterine bleeding. On further questioning the patient states that time she has gone several months without a menstrual cycle and then she would bleed very heavy for 7-10 days. She was last seen in the office on 08/25/2013 She was evaluated 18 months ago here in the office and had an ultrasound and sonohysterogram and she was a candidate for an endometrial ablation or the Mirena IUD but due to the fact that her insurance would not cover it she did not return until now. Patient's had previous tubal sterilization procedure. The patient denies any past history of abnormal Pap smear. Patient's both parents have diabetes.  Patient had an endometrial biopsy done or 18 months ago at another facility and the report was as follows:   Endometrium, biopsy  INACTIVE ENDOMETRIUM WITH STROMAL DECIDUAL CHANGE CONSISTENT WITH  PROGESTATIONAL EFFECT. NO HYPERPLASIA OR CARCINOMA  On the office visit of 08/25/2013 she underwent an endometrial biopsy to compare with previous 18 months with the following results:  Diagnosis Endometrium, biopsy, uterus - PROLIFERATIVE ENDOMETRIUM. - BENIGN ENDOCERVICAL MUCOSA. - NO HYPERPLASIA OR MALIGNANCY  Patient had a normal comprehensive metabolic panel along with a normal TSH. Last year she had a normal prolactin level. CBC was not drawn on last visit? Clerical mistake?  Ultrasound/sono histogram: Uterus measured 12.1 x 8.2 x 4.6 cm with endometrial stripe of 4.9 mm. The patient had 2 small fibroids largest one measuring 26 x 17 x 22 mm. Ovaries were normal. The cervix had previously been cleansed with Betadine solution. A sterile intrauterine catheter was introduced with instillation of normal saline and no intrauterine abnormalities were detected.  Assessment/plan: Patient with past history of oligomenorrhea/anovulatory has contributed to her bleeding pattern. The patient  had normal endometrial biopsy and normal sonohysterogram. All last visit she was started on 20 mcg oral contraceptive pill and has done well she will use this to regulate her cycle. Once again the risks benefits and pros and cons were discussed. There was no contraindication. She is a nonsmoker. She has no family history of any bleeding disorders. She will stop by the lab today so we can check her CBC.

## 2014-08-12 ENCOUNTER — Encounter: Payer: Self-pay | Admitting: Women's Health

## 2014-08-12 ENCOUNTER — Ambulatory Visit (INDEPENDENT_AMBULATORY_CARE_PROVIDER_SITE_OTHER): Payer: BC Managed Care – PPO | Admitting: Women's Health

## 2014-08-12 VITALS — BP 136/80 | Ht 61.0 in | Wt 153.0 lb

## 2014-08-12 DIAGNOSIS — N920 Excessive and frequent menstruation with regular cycle: Secondary | ICD-10-CM

## 2014-08-12 MED ORDER — NORETHIN ACE-ETH ESTRAD-FE 1-20 MG-MCG PO TABS: 1.0000 | ORAL_TABLET | Freq: Every day | ORAL | Status: DC

## 2014-08-12 NOTE — Progress Notes (Signed)
Presents for heavy bleeding and prolonged menstruation. May 2013 and Dec. 2014 was seen by Dr. Toney Rakes for similar complaints.  Workup consisted of sonohysterogram and [place], with diagnosis of 2 small uterine fibroids.  Endometrial ablation or Mirena IUD declined due to insurance issues. 09/2013 started Junel.  LMP 07/27/14 lasting until 08/06/14. Complains period was heavy with abdominal bloating, cramping, and fullness. Did not resume Junel after placebo. BTL. Not sexually active  Exam: Well appearing. External genitalia WNLs. No vaginal discharge/erythema or blood. Cervix visualized without erythema. Uterus retroverted. No CMT or adnexal fullness on bimanual exam.   Menorrhagia Fibroid uterus/BTL  Plan: Refill Junel 1/34mcg and start taking today.  Reviewed starting back on pills even if cycle not completed.  Annual exam due in Nov. 2015 with Dr. Toney Rakes.  Discuss testing for VonWillebrands with him at this time due to paternal family history, daughter with von Willebrand's.Marland Kitchen

## 2014-08-12 NOTE — Patient Instructions (Signed)

## 2014-08-17 ENCOUNTER — Encounter: Payer: Self-pay | Admitting: Women's Health

## 2014-08-25 ENCOUNTER — Ambulatory Visit: Payer: BC Managed Care – PPO | Admitting: Women's Health

## 2014-08-31 ENCOUNTER — Encounter: Payer: BC Managed Care – PPO | Admitting: Gynecology

## 2014-09-22 ENCOUNTER — Encounter: Payer: BC Managed Care – PPO | Admitting: Gynecology

## 2014-10-21 ENCOUNTER — Encounter: Payer: BC Managed Care – PPO | Admitting: Gynecology

## 2014-11-05 ENCOUNTER — Encounter: Payer: Self-pay | Admitting: Women's Health

## 2014-11-17 ENCOUNTER — Encounter: Payer: Self-pay | Admitting: Gynecology

## 2014-11-25 ENCOUNTER — Encounter: Payer: Self-pay | Admitting: Women's Health

## 2014-11-25 ENCOUNTER — Ambulatory Visit (INDEPENDENT_AMBULATORY_CARE_PROVIDER_SITE_OTHER): Payer: BLUE CROSS/BLUE SHIELD | Admitting: Women's Health

## 2014-11-25 VITALS — BP 128/80 | Ht 61.0 in | Wt 151.0 lb

## 2014-11-25 DIAGNOSIS — N760 Acute vaginitis: Secondary | ICD-10-CM

## 2014-11-25 DIAGNOSIS — N920 Excessive and frequent menstruation with regular cycle: Secondary | ICD-10-CM

## 2014-11-25 DIAGNOSIS — A499 Bacterial infection, unspecified: Secondary | ICD-10-CM

## 2014-11-25 DIAGNOSIS — Z833 Family history of diabetes mellitus: Secondary | ICD-10-CM

## 2014-11-25 DIAGNOSIS — B9689 Other specified bacterial agents as the cause of diseases classified elsewhere: Secondary | ICD-10-CM

## 2014-11-25 DIAGNOSIS — Z13220 Encounter for screening for lipoid disorders: Secondary | ICD-10-CM

## 2014-11-25 DIAGNOSIS — Z01419 Encounter for gynecological examination (general) (routine) without abnormal findings: Secondary | ICD-10-CM

## 2014-11-25 LAB — CBC WITH DIFFERENTIAL/PLATELET
BASOS PCT: 1 % (ref 0–1)
Basophils Absolute: 0.1 10*3/uL (ref 0.0–0.1)
Eosinophils Absolute: 0.2 10*3/uL (ref 0.0–0.7)
Eosinophils Relative: 2 % (ref 0–5)
HEMATOCRIT: 35.2 % — AB (ref 36.0–46.0)
HEMOGLOBIN: 10.9 g/dL — AB (ref 12.0–15.0)
Lymphocytes Relative: 27 % (ref 12–46)
Lymphs Abs: 2.6 10*3/uL (ref 0.7–4.0)
MCH: 24.2 pg — ABNORMAL LOW (ref 26.0–34.0)
MCHC: 31 g/dL (ref 30.0–36.0)
MCV: 78 fL (ref 78.0–100.0)
MONO ABS: 0.5 10*3/uL (ref 0.1–1.0)
MPV: 9 fL (ref 8.6–12.4)
Monocytes Relative: 5 % (ref 3–12)
NEUTROS ABS: 6.2 10*3/uL (ref 1.7–7.7)
Neutrophils Relative %: 65 % (ref 43–77)
Platelets: 496 10*3/uL — ABNORMAL HIGH (ref 150–400)
RBC: 4.51 MIL/uL (ref 3.87–5.11)
RDW: 18.5 % — ABNORMAL HIGH (ref 11.5–15.5)
WBC: 9.6 10*3/uL (ref 4.0–10.5)

## 2014-11-25 LAB — LIPID PANEL
CHOLESTEROL: 176 mg/dL (ref 0–200)
HDL: 36 mg/dL — ABNORMAL LOW (ref >39)
LDL Cholesterol: 122 mg/dL — ABNORMAL HIGH (ref 0–99)
Total CHOL/HDL Ratio: 4.9 Ratio
Triglycerides: 92 mg/dL (ref <150)
VLDL: 18 mg/dL (ref 0–40)

## 2014-11-25 LAB — WET PREP FOR TRICH, YEAST, CLUE
Trich, Wet Prep: NONE SEEN
WBC, Wet Prep HPF POC: NONE SEEN
Yeast Wet Prep HPF POC: NONE SEEN

## 2014-11-25 LAB — URINALYSIS W MICROSCOPIC + REFLEX CULTURE
Bilirubin Urine: NEGATIVE
CASTS: NONE SEEN
Crystals: NONE SEEN
GLUCOSE, UA: NEGATIVE mg/dL
Ketones, ur: NEGATIVE mg/dL
Leukocytes, UA: NEGATIVE
Nitrite: NEGATIVE
PH: 5.5 (ref 5.0–8.0)
Protein, ur: NEGATIVE mg/dL
Specific Gravity, Urine: 1.02 (ref 1.005–1.030)
Urobilinogen, UA: 1 mg/dL (ref 0.0–1.0)
WBC UA: NONE SEEN WBC/hpf (ref <3)

## 2014-11-25 LAB — COMPREHENSIVE METABOLIC PANEL
ALBUMIN: 4.1 g/dL (ref 3.5–5.2)
ALK PHOS: 56 U/L (ref 39–117)
ALT: 9 U/L (ref 0–35)
AST: 12 U/L (ref 0–37)
BILIRUBIN TOTAL: 0.3 mg/dL (ref 0.2–1.2)
BUN: 10 mg/dL (ref 6–23)
CHLORIDE: 105 meq/L (ref 96–112)
CO2: 29 meq/L (ref 19–32)
Calcium: 8.7 mg/dL (ref 8.4–10.5)
Creat: 0.56 mg/dL (ref 0.50–1.10)
GLUCOSE: 84 mg/dL (ref 70–99)
Potassium: 3.9 mEq/L (ref 3.5–5.3)
Sodium: 140 mEq/L (ref 135–145)
Total Protein: 6.7 g/dL (ref 6.0–8.3)

## 2014-11-25 MED ORDER — METRONIDAZOLE 500 MG PO TABS: 500.0000 mg | ORAL_TABLET | Freq: Two times a day (BID) | ORAL | Status: DC

## 2014-11-25 MED ORDER — NORETHIN ACE-ETH ESTRAD-FE 1-20 MG-MCG PO TABS: 1.0000 | ORAL_TABLET | Freq: Every day | ORAL | Status: DC

## 2014-11-25 NOTE — Patient Instructions (Signed)
Ejercicios para Technical sales engineer (Exercise to [employer]) La actividad fsica lo ayudar a estar y [place] sano. IDEAS Y CONSEJOS PARA HACER EJERCICIOS Elija ejercicios que:  Pueda disfrutar.  Pueda acomodarlos en su rutina. No necesita ejercitar demasiado para estar sano. Puede realizar [employer] a un ritmo lento o mediano y [employer]. Usted puede:  Elongar antes y despus de Chief Technology Officer.  Pruebe practicar yoga, Pilates o tai chi.  Levantar pesas.  Caminar rpido, nadar, trotar, correr, subir escaleras, andar en bicicleta, bailar o andar en rollers.  Tomar clases de [place]. Actividad fsicaque puede quemar alrededor de 150 caloras:  Correr 20 cuadras en 15 minutos.  Jugar vley durante 45 a 60 minutos.  Limpiar y encerar el auto durante 45 a 60 minutos.  Jugar ftbol americano de toque.  Caminar 25 cuadras en 35 minutos.  Empujar un cochecito 20 cuadras en 30 minutos.  Jugar baloncesto durante 30 minutos.  Rastrillar hojas secas durante 30 minutos.  Andar en bicicleta 80 cuadras en 30 minutos.  Caminar 30 cuadras en 30 minutos.  Bailar durante 30 minutos.  Quitar la nieve con una pala durante 15 minutos.  Nadar vigorosamente durante 20 minutos.  Subir escaleras durante 15 minutos.  Andar en bicicleta 60 cuadras durante 15 minutos.  Arreglar el jardn entre 30 y 42 minutos.  Saltar a la soga durante 15 minutos.  Limpiar vidrios o pisos durante 45 a 60 minutos. Document Released: 01/06/2011 Document Revised: 12/25/2011 [facility] Patient Information 2015 [place]. This information is not intended to replace advice given to you by your health care provider. Make sure you discuss any questions you have with your health care provider. Levonorgestrel intrauterine device (IUD) Qu es este medicamento? El LEVONORGESTREL (DIU) es un dispositivo anticonceptivo (control de natalidad). El dispositivo se coloca dentro del tero por un  profesional de la salud. Se utiliza para Therapist, occupational y tambin se puede Risk manager para tratar el sangrado abundante que ocurre durante su perodo. Dependiendo del dispositivo, se puede utilizar por 3 a 5 aos. Este medicamento puede ser utilizado para otros usos; si tiene alguna pregunta consulte con su proveedor de atencin mdica o con su farmacutico. MARCAS COMERCIALES DISPONIBLES: Jackson Latino, Hubbard Hartshorn le debo informar a mi profesional de la salud antes de tomar este medicamento? Necesita saber si usted presenta alguno de los siguientes problemas o situaciones: -exmen de Papanicolaou anormal -cncer de mama, cuello del tero o tero -diabetes -endometritis -si tiene una infeccin plvica o genital actual o en el pasado -tiene ms de una pareja sexual o si su pareja tiene ms de una pareja -enfermedad cardiaca -antecedente de embarazo tubrico o ectpico -problemas del sistema inmunolgico -DIU colocado -enfermedad heptica o tumor del hgado -problemas con la coagulacin o si toma diluyentes sanguneos -[place] medicamentos intravenoso -forma inusual del tero -sangrado vaginal que no tiene explicacin -una reaccin alrgica o inusual al levonorgestrel, a otras hormonas, a la silicona o polietilenos, a otros medicamentos, alimentos, colorantes o conservantes -si est embarazada o buscando quedar embarazada -si est amamantando a un beb Cmo debo utilizar este medicamento? Un profesional de Estate agent este dispositivo en el tero. Hable con su pediatra para informarse acerca del uso de este medicamento en nios. Puede requerir atencin especial. Sobredosis: Pngase en contacto inmediatamente con un centro toxicolgico o una sala de urgencia si usted cree que haya tomado demasiado medicamento. ATENCIN: [employer] es solo para usted. No comparta este medicamento con nadie. Qu sucede si me olvido de  una dosis? No se aplica en este caso. Qu puede interactuar  con este medicamento? No tome esta medicina con ninguno de los siguientes medicamentos: -amprenavir -bosentano -fosamprenavir Esta medicina tambin puede interactuar con los siguientes medicamentos: -aprepitant -barbitricos para producir el sueo o para el tratamiento de convulsiones -bexaroteno -griseofulvina -medicamentos para tratar los convulsiones, tales como [place], [place], [place], [place], [place], topiramato -modafinilo -pioglitazona -rifabutina -rifampicina -rifapentina -algunos medicamentos para tratar el virus VIH, tales como atazanavir, indinavir, lopinavir, nelfinavir, tipranavir, ritonavir -hierba de [place] -warfarina Puede ser que esta lista no menciona todas las posibles interacciones. Informe a su profesional de [employer]	[place] de [employer] productos a base de hierbas, medicamentos de [place] o suplementos nutritivos que est tomando. Si usted fuma, consume bebidas alcohlicas o si utiliza drogas ilegales, indqueselo tambin a su profesional de [employer]	[place]. Algunas sustancias pueden interactuar con su medicamento. A qu debo estar atento al usar [employer]? Visite a su mdico o a su profesional de la salud para chequear su evolucin peridicamente. Visite a su mdico si usted o su pareja tiene relaciones sexuales con [employer], se vuelve VIH positivo o contrae una enfermedad de transmisin sexual. Este medicamento no la protege de la infeccin por VIH (SIDA) ni de ninguna otra enfermedad de transmisin sexual. Puede controlar la ubicacin del DIU usted misma palpando con sus dedos limpios los hilos en la parte anterior de la vagina. No tire de los hilos. Es un buen hbito controlar la ubicacin del dispositivo despus de cada perodo menstrual. Si no slo siente los hilos sino que adems siente otra parte ms del DIU o si no puede sentir los hilos, consulte a su mdico inmediatamente. El DIU puede salirse por s solo. Puede quedar embarazada si  el dispositivo se sale de Chief of Staff. Utilice un mtodo anticonceptivo adicional, como preservativos, y consulte a su proveedor de atencin mdica s observa que el DIU se sali de Chief of Staff. La utilizacin de tampones no cambia la posicin del DIU y no hay inconvenientes en usarlos durante su perodo. Qu efectos secundarios puedo tener al [employer] este medicamento? Efectos secundarios que debe informar a su mdico o a Barrister's clerk de la salud tan pronto como sea posible: -Chief of Staff como erupcin cutnea, picazn o urticarias, hinchazn de la cara, labios o lengua -fiebre, sntomas gripales -llagas genitales -alta presin sangunea -ausencia de un perodo menstrual durante 6 semanas mientras lo utiliza -Social research officer, government, Occupational hygienist en las piernas -dolor o sensibilidad del plvico -dolor de cabeza repentino o severo -signos de Media planner -calambres estomacales -falta de aliento repentina -problemas de coordinacin, del habla, al caminar -sangrado, flujo vaginal inusual -color amarillento de los ojos o la piel Efectos secundarios que, por lo general, no requieren atencin mdica (debe informarlos a su mdico o a su profesional de la salud si persisten o si son molestos): -acn -dolor de pecho -cambios en el deseo sexual o capacidad -cambios de peso -calambres, Tree surgeon o sensacin de [employer] se introduce el dispositivo -dolor de cabeza -sangrado menstruales irregulares en los primeros 3 a 6 meses de usar -nuseas Puede ser que esta lista no menciona todos los posibles efectos secundarios. Comunquese a su mdico por asesoramiento mdico [employer]. Usted puede informar los efectos secundarios a la FDA por telfono al 1-800-FDA-1088. Dnde debo guardar mi medicina? No se aplica en este caso. ATENCIN: Este folleto es un resumen. Puede ser que no cubra toda la posible informacin. Si usted tiene preguntas acerca de [employer],  consulte con su mdico, su  farmacutico o su profesional de [employer]	[place].  2015, Elsevier/Gold Standard. (2011-11-21 16:57:41)

## 2014-11-25 NOTE — Progress Notes (Signed)
Ahmiya Colon 03-20-1971 [id number]    History:    Presents for annual exam.  Continues to have heavy 7 day cycles often changing pads every 1-2 hours with 2 days of spotting after on birth control pills reports no missed pills. History of BTL with menorrhagia with cycles lasting 2 weeks. Normal Pap and mammogram history, mammogram overdue. Has lost 10 pounds in the past year with diet and exercise.   Past medical history, past surgical history, family history and social history were all reviewed and documented in the EPIC chart. 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 4 children all doing well, Gardasil discussed.  ROS:  A ROS was performed and pertinent positives and negatives are included.  Exam:  Filed Vitals:   11/25/14 1513  BP: 128/80    General appearance:  Normal Thyroid:  Symmetrical, normal in size, without palpable masses or nodularity. Respiratory  Auscultation:  Clear without wheezing or rhonchi Cardiovascular  Auscultation:  Regular rate, without rubs, murmurs or gallops  Edema/varicosities:  Not grossly evident Abdominal  Soft,nontender, without masses, guarding or rebound.  Liver/spleen:  No organomegaly noted  Hernia:  None appreciated  Skin  Inspection:  Grossly normal   Breasts: Examined lying and sitting.     Right: Without masses, retractions, discharge or axillary adenopathy.     Left: Without masses, retractions, discharge or axillary adenopathy. Gentitourinary   Inguinal/mons:  Normal without inguinal adenopathy  External genitalia:  Normal  BUS/Urethra/Skene's glands:  Normal  Vagina:  Moderate discharge with odor, wet prep positive for amines, clues, TNTC bacteria  Cervix:  Normal  Uterus: Enlarged fibroid 16 week size.  Midline and mobile  Adnexa/parametria:     Rt: Without masses or tenderness.   Lt: Without masses or tenderness.  Anus and perineum: Normal  Digital rectal exam: Normal sphincter tone without palpated masses or  tenderness  Assessment/Plan:  44 y.o. SHF G4P4 for annual exam.    Fibroid uterus Menorrhagia better on Loestrin BTL Bacterial vaginosis   Plan: Options reviewed will check Mirena IUD coverage, reviewed slight risk for hemorrhage, infection, perforation, Dr. Toney Rakes to place with cycle.Continue Loestrin 1/20 prescription, proper use, slight risk for blood clots and strokes reviewed. Flagyl 500 twice daily for 7 days alcohol precautions reviewed, instructed to call if discharge persists. SBE's, schedule annual mammogram, reviewed importance of annual screen, exercise, calcium rich diet, vitamin D 1000 daily encouraged. CBC, hemoglobin A1c, CMP, lipid panel, UA, Pap normal with negative HR HPV typing 2014, new screening guidelines reviewed.   Huel Cote [facility], 5:06 PM 11/25/2014

## 2014-11-26 LAB — HEMOGLOBIN A1C
HEMOGLOBIN A1C: 4.9 % (ref <5.7)
Mean Plasma Glucose: 94 mg/dL (ref <117)

## 2014-11-30 ENCOUNTER — Telehealth: Payer: Self-pay | Admitting: Gynecology

## 2014-11-30 NOTE — Telephone Encounter (Signed)
11/30/14-Pt was advised today that her [facility] ins puts the cost of the [place] IUD towards her deductible as it is for a med dx, not contraception. Her cost would be $991.57(811.28 for IUD and 180.29 insert). She was told this amt has to be paid in full day of insertion and that insertion needed to be done during her cycle. She did have me speak with her daughter in [place] as she did not completely understand. She asked about the cost of the endometrial ablation we do here in the office and I sent a message to Juliann Pulse to get back to her with that amount/wl

## 2014-12-04 ENCOUNTER — Telehealth: Payer: Self-pay

## 2014-12-04 NOTE — Telephone Encounter (Signed)
I had a request through [name] to check benefits for Debbie [name] ablation for patient.  I called patient with this info.  Charges will apply to ded and co-ins and she will have $4590 surgery prepayment. Patient was informed of this info.

## 2014-12-23 ENCOUNTER — Other Ambulatory Visit: Payer: Self-pay

## 2015-01-06 ENCOUNTER — Encounter: Payer: Self-pay | Admitting: Women's Health

## 2015-01-06 ENCOUNTER — Ambulatory Visit (INDEPENDENT_AMBULATORY_CARE_PROVIDER_SITE_OTHER): Payer: BLUE CROSS/BLUE SHIELD | Admitting: Women's Health

## 2015-01-06 VITALS — BP 132/80 | Ht 61.0 in | Wt 152.0 lb

## 2015-01-06 DIAGNOSIS — N920 Excessive and frequent menstruation with regular cycle: Secondary | ICD-10-CM

## 2015-01-06 MED ORDER — MEGESTROL ACETATE 40 MG PO TABS: 40.0000 mg | ORAL_TABLET | Freq: Two times a day (BID) | ORAL | Status: DC

## 2015-01-06 NOTE — Progress Notes (Signed)
Patient ID: Debbie [name], female   DOB: 1971-02-05, 44 y.o.   MRN: [id number] Presents with complaint of heavy menstrual cycle. BTL with small fibroids. Has had problems with menorrhagia for the past year, has had a negative sonohysterogram, normal TSH and prolactin. On Loestrin with no missed pills. February 4 day cycle, March had a 5 day regular cycle started week 2-1/2 of pill pak,followed by 7 days of heavy bleeding and continues to have heavy bleeding with clots, changing pads every 2 hours,placebo week starts today. Has a very high deductible and states cannot afford to have ablation or hysterectomy. Questions what can be done. Mild cramping, denies discharge, odor or itching. Denies urinary symptoms.  Exam: Appears well.  Menorrhagia on Loestrin Small fibroids/BTL  Plan: Megace 40 mg twice daily for 10 days, instructed to call if bleeding does not stop. Start back on Loestrin 1/20 after completing Megace. Instructed to call if cycles do not regulate.

## 2015-01-14 ENCOUNTER — Encounter: Payer: Self-pay | Admitting: Women's Health

## 2015-03-24 ENCOUNTER — Encounter: Payer: Self-pay | Admitting: Obstetrics & Gynecology

## 2015-03-24 ENCOUNTER — Other Ambulatory Visit (HOSPITAL_COMMUNITY)
Admission: RE | Admit: 2015-03-24 | Discharge: 2015-03-24 | Disposition: A | Discharge status: Home / Self Care | Payer: BLUE CROSS/BLUE SHIELD | Source: Ambulatory Visit | Attending: Obstetrics & Gynecology | Admitting: Obstetrics & Gynecology

## 2015-03-24 ENCOUNTER — Ambulatory Visit (INDEPENDENT_AMBULATORY_CARE_PROVIDER_SITE_OTHER): Payer: BLUE CROSS/BLUE SHIELD | Admitting: Obstetrics & Gynecology

## 2015-03-24 VITALS — BP 126/84 | HR 89 | Temp 98.4°F | Wt 150.8 lb

## 2015-03-24 DIAGNOSIS — N939 Abnormal uterine and vaginal bleeding, unspecified: Secondary | ICD-10-CM

## 2015-03-24 DIAGNOSIS — D259 Leiomyoma of uterus, unspecified: Secondary | ICD-10-CM | POA: Diagnosis not present

## 2015-03-24 DIAGNOSIS — R87619 Unspecified abnormal cytological findings in specimens from cervix uteri: Secondary | ICD-10-CM | POA: Diagnosis present

## 2015-03-24 DIAGNOSIS — D500 Iron deficiency anemia secondary to blood loss (chronic): Secondary | ICD-10-CM | POA: Diagnosis not present

## 2015-03-24 DIAGNOSIS — N920 Excessive and frequent menstruation with regular cycle: Secondary | ICD-10-CM | POA: Diagnosis not present

## 2015-03-24 LAB — CBC WITH DIFFERENTIAL/PLATELET
BASOS PCT: 1 % (ref 0–1)
Basophils Absolute: 0.1 10*3/uL (ref 0.0–0.1)
EOS ABS: 0.1 10*3/uL (ref 0.0–0.7)
Eosinophils Relative: 2 % (ref 0–5)
HCT: 22.5 % — ABNORMAL LOW (ref 36.0–46.0)
Hemoglobin: 6.5 g/dL — CL (ref 12.0–15.0)
LYMPHS PCT: 28 % (ref 12–46)
Lymphs Abs: 1.8 10*3/uL (ref 0.7–4.0)
MCH: 18.5 pg — ABNORMAL LOW (ref 26.0–34.0)
MCHC: 28 g/dL — ABNORMAL LOW (ref 30.0–36.0)
MCV: 63.9 fL — ABNORMAL LOW (ref 78.0–100.0)
MONOS PCT: 8 % (ref 3–12)
MPV: 9.1 fL (ref 8.6–12.4)
Monocytes Absolute: 0.5 10*3/uL (ref 0.1–1.0)
NEUTROS PCT: 61 % (ref 43–77)
Neutro Abs: 3.8 10*3/uL (ref 1.7–7.7)
PLATELETS: 497 10*3/uL — AB (ref 150–400)
RBC: 3.52 MIL/uL — ABNORMAL LOW (ref 3.87–5.11)
RDW: 22.2 % — ABNORMAL HIGH (ref 11.5–15.5)
WBC: 6.3 10*3/uL (ref 4.0–10.5)

## 2015-03-24 LAB — POCT PREGNANCY, URINE: Preg Test, Ur: NEGATIVE

## 2015-03-24 LAB — TSH: TSH: 1.356 u[IU]/mL (ref 0.350–4.500)

## 2015-03-24 MED ORDER — MEGESTROL ACETATE 40 MG PO TABS: 40.0000 mg | ORAL_TABLET | Freq: Two times a day (BID) | ORAL | Status: DC

## 2015-03-24 MED ORDER — INTEGRA F 125-1 MG PO CAPS: 1.0000 | ORAL_CAPSULE | Freq: Every day | ORAL | Status: DC

## 2015-03-24 NOTE — Progress Notes (Signed)
Patient ID: Debbie [name], female   DOB: 05/04/1971, 44 y.o.   MRN: [id number] History:  44 y.o. [id number] here today for eval of AUB.  She had an eval by Dr. Toney Rakes ofc.  She is s/p endometrial biopsy in 2014 which was neg.  She also had a PAP in 2014 which was neg with neg hrHPV.  She reports increased bleeding that has been heavier in the past 2 weeks.     She reports feeling dizzy and light headed.  She was OCP's daily with Megace added with heavy cycles.  Pt wants definitve treatment with a hysterectomy.      The following portions of the patient's history were reviewed and updated as appropriate: allergies, current medications, past family history, past medical history, past social history, past surgical history and problem list.  Past Medical History  Diagnosis Date  . Headache(784.0)   . Anemia   . Dysmenorrhea    Past Surgical History  Procedure Laterality Date  . Tubal ligation    . Cesarean section      X2   History   Social History  . Marital Status: Divorced    Spouse Name: N/A  . Number of Children: N/A  . Years of Education: N/A   Occupational History  . Not on file.   Social History Main Topics  . Smoking status: Never Smoker   . Smokeless tobacco: Never Used  . Alcohol Use: No  . Drug Use: No  . Sexual Activity: Yes    Birth Control/ Protection: Surgical     Comment: BTL   Other Topics Concern  . Not on file   Social History Narrative   Family History  Problem Relation Age of Onset  . Anesthesia problems Neg Hx   . Cancer Paternal Aunt 92    BREAST CANCER  . Cancer Cousin     [name]  . Cancer Paternal Grandfather     prostate   Current Outpatient Prescriptions on File Prior to Visit  Medication Sig Dispense Refill  . norethindrone-ethinyl estradiol (JUNEL FE 1/20) 1-20 MG-MCG tablet Take 1 tablet by mouth daily. 3 Package 4   No current facility-administered medications on file prior to visit.   No Known  Allergies   Review of Systems:  A comprehensive review of systems was negative.  Objective:  Physical Exam Blood pressure 126/84, pulse 89, temperature 98.4 F (36.9 C), temperature source Oral, weight 150 lb 12.8 oz (68.402 kg). Gen: NAD Abd: Soft, nontender and nondistended Pelvic: Normal appearing external genitalia; normal appearing vaginal mucosa and cervix.  Normal discharge.  Small uterus, no other palpable masses, no uterine or adnexal tenderness  The indications for endometrial biopsy were reviewed.   Risks of the biopsy including cramping, bleeding, infection, uterine perforation, inadequate specimen and need for additional procedures  were discussed. The patient states she understands and agrees to undergo procedure today. Consent was signed. Time out was performed. Urine HCG was negative. A sterile speculum was placed in the patient's vagina and the cervix was prepped with Betadine. A single-toothed tenaculum was placed on the anterior lip of the cervix to stabilize it. The 3 mm pipelle was introduced into the endometrial cavity without difficulty to a depth of 8cm, and a moderate amount of tissue was obtained and sent to pathology. The instruments were removed from the patient's vagina. Minimal bleeding from the cervix was noted. The patient tolerated the procedure well. Routine post-procedure instructions were given to the patient. The patient will  follow up to review the results and for further management.     Assessment & Plan:  F/u results of Endobx Pelvic sono Megace 40mg  bid Integra 1 po q day Labs: CBC & TSH  Patient desires surgical management with [place] with salpingectomy.  The risks of surgery were discussed in detail with the patient including but not limited to: bleeding which may require transfusion or reoperation; infection which may require prolonged hospitalization or re-hospitalization and antibiotic therapy; injury to bowel, bladder, ureters and major vessels or  other surrounding organs; need for additional procedures including laparotomy; thromboembolic phenomenon, incisional problems and other postoperative or anesthesia complications.  Patient was told that the likelihood that her condition and symptoms will be treated effectively with this surgical management was very high; the postoperative expectations were also discussed in detail. The patient also understands the alternative treatment options which were discussed in full. All questions were answered.  She was told that she will be contacted by our surgical scheduler regarding the time and date of her surgery; routine preoperative instructions of having nothing to eat or drink after midnight on the day prior to surgery and also coming to the hospital 1 1/2 hours prior to her time of surgery were also emphasized.  She was told she may be called for a preoperative appointment about a week prior to surgery and will be given further preoperative instructions at that visit. Printed patient education handouts about the procedure were given to the patient to review at home.

## 2015-03-24 NOTE — Patient Instructions (Addendum)
Hemorragia uterina disfuncional (Dysfunctional Uterine Bleeding) Normalmente, los perodos menstruales comienzan en las jvenes de entre 11 y 58 aos. Un ciclo o perodo menstrual puede repetirse [employer] [address] y los 30 das y dura [place] 1 y 60 das. [employer] 12 y los 14 das antes de que comience el ciclo menstrual, se produce la ovulacin (los ovarios producen vulos). Cuando cuente los periodos, hgalo desde Engineer, manufacturing systems del comienzo de la hemorragia del perodo anterior Management consultant de la hemorragia del perodo siguiente. La hemorragia uterina disfuncional (anormal) es diferente del perodo menstrual normal. Los perodos pueden comenzar antes o despus que lo habitual. Pueden ser menos abundantes, presentar cogulos, o ser ms abundantes. Puede tener [employer] perodos o IT consultant un perodo o ms. Puede tener hemorragia luego de [employer], despus de la menopausia o faltarle el perodo menstrual. CAUSAS  Embarazo (normal, aborto espontneo, embarazo ectpico).  DIU (dispositivo intrauterino, anticonceptivo)  Pldoras anticonceptivas  Tratamiento hormonal  La menopausia  Infeccin en el cervix.  Problemas de coagulacin.  Infecciones de la superficie interna del tero.  Endometriosis: la superficie interna del tero se desarrolla en la pelvis y otros rganos femeninos.  Adherencias (tejido cicatrizal) dentro del tero.  Obesidad o severa prdida de peso.  Plipos en el tero.  Cncer en el crvix, la vagina o el tero.  Quiste de ovarios o Sndrome ovrico poliqustico.  Otras enfermedades (diabetes, enfermedad de la tiroides, Social research officer, government).  Fibromas en el tero (tumores no cancerosos).  Problemas con las hormonas femeninas.  Hiperplasia del endometrio: capa gruesa y clulas agrandadas dentro del tero.  Medicamentos que interfieren con la ovulacin.  Radiacin en la pelvis o el abdomen.  Quimioterapia. DIAGNSTICO  El mdico  Marketing executive historia de sus perodos Benjamin, los medicamentos que toma, los cambios en el peso corporal, el estrs de la vida diaria y cualquier problema mdico que tenga.  El mdico realizar un examen fsico, incluyendo un examen plvico.  Tambin le solicitar anlisis para completar el diagnstico. Ellos son:  Papanicolau.  Anlisis de [place].  Cultivos para descartar infecciones.  Tomografa computada.  Ultrasonido.  Histeroscopa.  Laparoscopa.  Resonancia magntica.  Histerosalpingografa.  Dilatacin y curetaje.  Biopsia de endometrio. TRATAMIENTO El tratamiento depender de la causa de la hemorragia.. El tratamiento consiste en:  Observacin de los perodos menstruales durante algunos meses.  Prescripcin de medicamentos como:  Antibiticos:  Hormonas.  Pldoras anticonceptivas  Retirar el DIU (dispositivo intrauterino, anticonceptivo).  Ciruga:  Dilatacin y curetaje (raspado y remocin de tejido de la zona interna del tero).  Laparoscopa (examen del interior del abdomen con un tubo con luz).  Ablacin uterina (destruccin de la membrana que cubre el tero con corriente elctrica, rayos lser, congelamiento o calor ).  Histeroscopa (examen del cuello y el tero con un tubo con luz).  Histerectoma (extirpacin del tero). INSTRUCCIONES PARA EL CUIDADO DOMICILIARIO  Si el profesional que la asiste le prescribe medicamentos, tmelos tal como se le indic. No cambie ni reemplace medicamentos sin consultarlo con Writer.  Las hemorragias de larga duracin pueden traen como consecuencia un dficit de hierro. El profesional que lo asiste podr Conservation officer, nature. Esto ayuda a Air cabin crew que el organismo pierde luego de una hemorragia abundante. Tome los medicamentos tal como se le indic.  No tome aspirina o medicamentos que la contengan u desde una semana antes del perodo menstrual ni durante el mismo. La aspirina puede hacer  que la hemorragia empeore.  Si necesita cambiar el apsito o el tampn mas de una vez cada 2 horas, permanezca en cama con los pies elevados y aplique una compresa fra en la zona baja del abdomen. Descanse todo lo que pueda hasta que la hemorragia se detenga.  Consuma alimentos balanceados. Coma alimentos ricos en hierro. Por ejemplo:  Vegetales verdes frescos.  Cereales integrales y cereales y panes con salvado.  Huevos.  Carnes.  Hgado.  No trate de perder peso hasta que la hemorragia anormal se detenga y los niveles de hierro en la sangre vuelvan a la normalidad. No levante pesos de ms de 10 libras (4.5 kg.) ni realice actividades extenuantes mientras tenga la hemorragia.  Durante un par de meses tome nota en un almanaque y [employer] comienzo y el fin de su perodo menstrual y el tipo de sangrado (escaso, [place], [place], gotas, cogulos o falta de perodo). El objetivo es que su mdico evale mejor el problema. SOLICITE ATENCIN MDICA SI:  Debe cambiar el apsito o el tampn ms de una vez cada hora.  Se siente mareada o dbil.  Tiene algn problema que pueda relacionarse con el medicamento que est tomando.  Siente dolor.  Desea retirar su DIU.  Quiere suspender o cambiar sus pldoras anticonceptivas u hormonas.  Tiene algn tipo de hemorragia anormal mencionada anteriormente.  Tiene ms de 42 aos y an no ha tenido su perodo menstrual.  Tiene 43 aos y an tiene Chartered certified accountant.  Tiene alguno de los sntomas mencionados anteriormente.  Aparece una erupcin cutnea. SOLICITE ATENCIN MDICA DE INMEDIATO SI:  La temperatura oral se eleva sin motivo por encima de 38,9 C (102 F).  Comienza a sentir escalofros.  Debe cambiar el apsito o el tampn ms de una vez cada hora.  Siente dolor abdominal.  Pierde el conocimiento, se desmaya. Document Released: 07/12/2005 Document Revised: 06/26/2012 [facility] Patient Information 2015 [place]. This  information is not intended to replace advice given to you by your health care provider. Make sure you discuss any questions you have with your health care provider. Histerectoma total laparoscpica, cuidados posteriores (Total Laparoscopic Hysterectomy, Care After) Siga estas instrucciones durante las prximas semanas. Estas indicaciones le proporcionan informacin acerca de cmo deber cuidarse despus del procedimiento. El mdico tambin podr darle instrucciones ms especficas. El tratamiento se ha planificado de acuerdo a las prcticas mdicas actuales, pero a veces se producen problemas. Comunquese con el mdico si tiene algn problema o tiene dudas despus del procedimiento. QU ESPERAR DESPUS DEL PROCEDIMIENTO  Dolor y hematomas en los sitios de incisin. Le darn analgsicos para Financial controller.  Podr tener sntomas de menopausia, como calor repentino, sudoraciones nocturnas e insomnio si le han extirpado los ovarios.  Podr sentir dolor de garganta por el tubo del respirador que le colocaron durante la [place]. INSTRUCCIONES PARA EL CUIDADO EN EL HOGAR  Utilice los medicamentos de venta libre o recetados para Glass blower/designer, el malestar o la fiebre, segn se lo indique el mdico.  No tome aspirina. Puede ocasionar hemorragias.  No conduzca mientras est tomando analgsicos.  Siga las indicaciones de su mdico con respecto a la dieta, la actividad fsica, levantar objetos, conducir y para las actividades en general.  Reanude su dieta habitual, segn las indicaciones y los permisos.  Descanse y duerma lo suficiente.  Nose haga duchas vaginales, no utilice tampones ni tenga relaciones sexuales durante al menos 6 semanas o hasta que el profesional la autorice.  Cambie los apsitos (vendajes) tal [employer]  indic su mdico.  Controle su temperatura y notifique a su mdico si tiene fiebre.  Tome duchas en lugar de baos durante 2 a 3 semanas.  No beba alcohol hasta que el  mdico la autorice.  Si est estreida, tome un laxante suave, si el mdico la [place]. Los alimentos que contienen salvado la ayudarn para el problema de la estreimiento. Debe ingerir la cantidad de lquidos suficientes para Theatre manager la orina de tono claro o color amarillo plido.  Trate de que alguien la acompae en su casa durante 1 o 2semanas, para ayudarla con los [employer].  Cumpla con todas las visitas de control, segn le indique su mdico. SOLICITE ATENCIN MDICA SI:  Observa enrojecimiento, hinchazn o siente dolor cada vez ms intenso en los sitios de las incisiones.  Tiene pus en el sitio de la incisin.  Advierte un olor feo que proviene de la incisin.  La incisin se abre.  Se siente mareada o sufre un desmayo.  Siente dolor o tiene una hemorragia al [employer].  Tiene diarrea persistente.  Tiene nuseas o vmitos persistentes.  Tiene flujo vaginal anormal.  Tiene una erupcin cutnea.  Tiene alguna reaccin anormal o aparece una alergia por los medicamentos.  El dolor no se alivia con los medicamentos recetados. SOLICITE ATENCIN MDICA DE INMEDIATO SI:  Siente falta de aire o dolor en el pecho.  Siente dolor intenso abdominal que no se alivia con los [employer].  Presenta dolor o hinchazn en las piernas. ASEGRESE DE QUE:  Comprende estas instrucciones.  Controlar su afeccin.  Recibir ayuda de inmediato si no mejora o si empeora. Document Released: 07/23/2013 Document Revised: 10/07/2013 [facility] Patient Information 2015 [place]. This information is not intended to replace advice given to you by your health care provider. Make sure you discuss any questions you have with your health care provider. Histerectoma total laparoscpica (Total Laparoscopic Hysterectomy) La histerectoma laparoscpica total es una ciruga mnimamente invasiva para extirpar el tero y el cuello. Esta ciruga se realiza haciendo pequeos  cortes(incisiones) en el abdomen. Tambin se puede hacer con un tubo delgado que ilumina (laparoscopio) que se inserta a travs de dos pequeas incisiones en el abdomen inferior. Tambin durante esta ciruga podrn extirparse las trompas de Falop[place] y los ovarios (salpingo-ooforectoma bilateral). Los beneficios de la ciruga mnimamente invasiva son:  Producer, television/film/video.  Menor riesgo de cogulos sanguneos.  Menor riesgo de sufrir infecciones.  Regreso ms rpido a las [employer]. INFORME A SU MDICO:  Cualquier alergia que tenga.  Todos los [employer] [place], incluyendo vitaminas, hierbas, gotas oftlmicas, cremas y medicamentos de venta libre.  Problemas previos que usted o los [employer] de su familia hayan tenido con el uso de anestsicos.  Enfermedades de [employer].  Cirugas previas.  Padecimientos mdicos. RIESGOS Y COMPLICACIONES  Generalmente es un procedimiento seguro. Sin embargo, Games developer procedimiento, pueden surgir complicaciones. Las complicaciones posibles son:  Hemorragias.  Cogulos de [employer] piernas o los pulmones.  Infeccin.  Lesin en los rganos circundantes.  Problemas con la anestesia.  Sntomas precoces de menopausia (sofocos, sudores nocturnos, insomnio).  Riesgo de conversin a una incisin abdominal abierta. ANTES DEL PROCEDIMIENTO  Consulte a su mdico si debe cambiar o suspender los medicamentos que toma habitualmente.  No tome aspirina ni anticoagulantes durante la semana previa a la [place], o segn le haya indicado su mdico.  No coma ni beba nada durante las 8 horas previas al procedimiento, o segn le haya indicado su mdico.  Si fuma, deje de hacerlo.  Solicite que la lleven a su casa luego de la ciruga y que alguien la ayude con las tareas de la casa durante el tiempo de la recuperacin. PROCEDIMIENTO   Le administraran antibiticos.  Se le colocar una sonda intravenosa en un brazo. Le administrarn  un medicamento que la har dormir (anestesia general).  Le insuflarn un gas (dixido de carbono) en el abdomen. Esto le permitir al cirujano observar el interior del abdomen, Optometrist la ciruga y si es necesario, el tratamiento de otros problemas encontrados.  Le harn tres o cuatro pequeas incisiones (generalmente de menos de  pulgada) en el abdomen. Una de estas incisiones se harn en la zona del ombligo. El laparoscopio se inserta en la incisin. El cirujano observa a travs del laparoscopio mientras realiza el procedimiento.  Otros instrumentos quirrgicos se insertan a travs de las otras incisiones.  El tero se extirpar a travs de la vagina o se seccionar en trozos pequeos y se retirar por las pequeas [place]. DESPUS DEL PROCEDIMIENTO  Se liberar el gas del interior del abdomen.  Despus de la ciruga la llevarn al rea de recuperacin donde un enfermero la observar y Chief Technology Officer su evolucin. Una vez que despierte, se encuentre estabilizada y pueda ingerir lquidos, excepto que ocurra un imprevisto, podr volver a Administrator, arts.  Suele haber pocas molestias despus de la ciruga debido a que las incisiones son muy pequeas.  Se le dar medicamento para el dolor mientras se encuentre en el hospital y para cuando vaya a su casa. Document Released: 09/21/2011 Document Revised: 06/04/2013 [facility] Patient Information 2015 [place]. This information is not intended to replace advice given to you by your health care provider. Make sure you discuss any questions you have with your health care provider.

## 2015-03-25 ENCOUNTER — Telehealth: Payer: Self-pay

## 2015-03-25 ENCOUNTER — Encounter (HOSPITAL_COMMUNITY): Payer: Self-pay | Admitting: *Deleted

## 2015-03-25 NOTE — Telephone Encounter (Signed)
Per Dr. Ihor Dow, to inform patient of very low hgb/anemia and need to continue megace and Integra and to go to ED with any dizziness or SOB. Called patient and informed her of results and recommendations. Patient verbalized understanding. No questions or concerns.

## 2015-03-30 ENCOUNTER — Encounter: Payer: Self-pay | Admitting: General Practice

## 2015-03-30 ENCOUNTER — Telehealth: Payer: Self-pay

## 2015-03-30 ENCOUNTER — Ambulatory Visit (HOSPITAL_COMMUNITY)
Admission: RE | Admit: 2015-03-30 | Discharge: 2015-03-30 | Disposition: A | Discharge status: Home / Self Care | Payer: BLUE CROSS/BLUE SHIELD | Source: Ambulatory Visit | Attending: Obstetrics & Gynecology | Admitting: Obstetrics & Gynecology

## 2015-03-30 DIAGNOSIS — N939 Abnormal uterine and vaginal bleeding, unspecified: Secondary | ICD-10-CM

## 2015-03-30 DIAGNOSIS — N946 Dysmenorrhea, unspecified: Secondary | ICD-10-CM | POA: Diagnosis not present

## 2015-03-30 DIAGNOSIS — N920 Excessive and frequent menstruation with regular cycle: Secondary | ICD-10-CM | POA: Insufficient documentation

## 2015-03-30 DIAGNOSIS — D259 Leiomyoma of uterus, unspecified: Secondary | ICD-10-CM

## 2015-03-30 NOTE — Telephone Encounter (Signed)
Called patient and informed her of results and recommendations. Patient verbalized understanding and gratitude. No questions or concerns.

## 2015-03-30 NOTE — Telephone Encounter (Signed)
-----   Message from Lavonia Drafts, MD sent at 03/29/2015  9:46 PM EDT ----- Please call pt.  Her endo bx is negative.  She is to continue the Megace until surgery.  Thx, clh-S

## 2015-05-04 ENCOUNTER — Encounter (HOSPITAL_COMMUNITY): Payer: Self-pay

## 2015-05-04 ENCOUNTER — Encounter (HOSPITAL_COMMUNITY)
Admission: RE | Admit: 2015-05-04 | Discharge: 2015-05-04 | Disposition: A | Discharge status: Home / Self Care | Payer: BLUE CROSS/BLUE SHIELD | Source: Ambulatory Visit | Attending: Obstetrics & Gynecology | Admitting: Obstetrics & Gynecology

## 2015-05-04 DIAGNOSIS — Z01818 Encounter for other preprocedural examination: Secondary | ICD-10-CM | POA: Insufficient documentation

## 2015-05-04 DIAGNOSIS — N939 Abnormal uterine and vaginal bleeding, unspecified: Secondary | ICD-10-CM | POA: Insufficient documentation

## 2015-05-04 HISTORY — DX: Hypoglycemia, unspecified: E16.2

## 2015-05-04 LAB — TYPE AND SCREEN
ABO/RH(D): O POS
Antibody Screen: NEGATIVE

## 2015-05-04 LAB — CBC
HCT: 38.8 % (ref 36.0–46.0)
Hemoglobin: 11.8 g/dL — ABNORMAL LOW (ref 12.0–15.0)
MCH: 22.9 pg — AB (ref 26.0–34.0)
MCHC: 30.4 g/dL (ref 30.0–36.0)
MCV: 75.2 fL — ABNORMAL LOW (ref 78.0–100.0)
Platelets: 328 10*3/uL (ref 150–400)
RBC: 5.16 MIL/uL — ABNORMAL HIGH (ref 3.87–5.11)
RDW: 27.4 % — ABNORMAL HIGH (ref 11.5–15.5)
WBC: 7.2 10*3/uL (ref 4.0–10.5)

## 2015-05-04 LAB — ABO/RH: ABO/RH(D): O POS

## 2015-05-04 NOTE — Patient Instructions (Signed)
Instrucciones:  Su cirugia esta programada para-( your procedure is scheduled on) :___8/2/16_______________  Debbie [name] por la entrada principal a la(s) -(enter through the main entrance at):__6:00 am___________  Debbie [name],  [place] e informenos de su llegada ( pick up phone, dial [phone] on arrival)  Por favor llame al [phone] si tiene algun problema la [name] ( please call [phone] if you have any problems the morning of surgery.)  Recuerde: (Remember)  No coma alimentos ni tome liquidos, incluyendo agua, despues de la medianoche del  ( Do not eat food or drink liquids including water after midnight on Monday 8/1/16_______________   Puede cepillarse los dientes en la manana de la [place]. (you may brush your teeth the morning of surgery)  NO use joyas, maquillaje de ojos, lapiz labial, crema para el cuerpo o esmalte de unas oscuro - las unas de los pies pueden estar pintados. ( Do not wear jewelry, eye makeup, lipstick, body lotion, or dark fingernail polish)  Puede usar desodorante ( you may wear deodorant)  Si va a ser ingresado despues de las [place], deje la [place] en el carro hasta que se le haya asignado una habitacion. ( If you are to be admitted after surgery, leave suitcase in car until your room has been assigned.)  A los pacientes que se les de de alta el mismo dia no se les permitira manejar a casa.  ( Patients discharged on the day of surgery will not be allowed to drive home)  Use ropa suelta y comoda de regreso a Manufacturing engineer. ( wear loose comfortable clothes for ride home)  [name] (patient signature) ______________________________________

## 2015-05-05 ENCOUNTER — Encounter: Payer: Self-pay | Admitting: *Deleted

## 2015-05-05 NOTE — Progress Notes (Unsigned)
FMLA completed and faxed.

## 2015-05-17 ENCOUNTER — Encounter (HOSPITAL_COMMUNITY): Payer: Self-pay | Admitting: Anesthesiology

## 2015-05-17 NOTE — Anesthesia Preprocedure Evaluation (Addendum)
Anesthesia Evaluation  Patient identified by MRN, date of birth, ID band Patient awake    Reviewed: Allergy & Precautions, NPO status , Patient's Chart, lab work & pertinent test results  Airway Mallampati: II  TM Distance: >3 FB Neck ROM: Full    Dental no notable dental hx. (+) Teeth Intact   Pulmonary neg pulmonary ROS,  breath sounds clear to auscultation  Pulmonary exam normal       Cardiovascular negative cardio ROS Normal cardiovascular examRhythm:Regular Rate:Normal     Neuro/Psych  Headaches, negative psych ROS   GI/Hepatic negative GI ROS, Neg liver ROS,   Endo/Other  negative endocrine ROS  Renal/GU negative Renal ROS  negative genitourinary   Musculoskeletal negative musculoskeletal ROS (+)   Abdominal (+) + obese,   Peds  Hematology  (+) anemia ,   Anesthesia Other Findings   Reproductive/Obstetrics Dysmenorrhea                            Anesthesia Physical Anesthesia Plan  ASA: II  Anesthesia Plan: General   Post-op Pain Management:    Induction: Intravenous  Airway Management Planned: Oral ETT  Additional Equipment:   Intra-op Plan:   Post-operative Plan: Extubation in OR  Informed Consent: I have reviewed the patients History and Physical, chart, labs and discussed the procedure including the risks, benefits and alternatives for the proposed anesthesia with the patient or authorized representative who has indicated his/her understanding and acceptance.   Dental advisory given  Plan Discussed with: CRNA and Anesthesiologist  Anesthesia Plan Comments:         Anesthesia Quick Evaluation

## 2015-05-18 ENCOUNTER — Ambulatory Visit (HOSPITAL_COMMUNITY): Payer: BLUE CROSS/BLUE SHIELD | Admitting: Anesthesiology

## 2015-05-18 ENCOUNTER — Observation Stay (HOSPITAL_COMMUNITY)
Admission: RE | Admit: 2015-05-18 | Discharge: 2015-05-19 | Disposition: A | Discharge status: Home / Self Care | Payer: BLUE CROSS/BLUE SHIELD | Source: Ambulatory Visit | Attending: Obstetrics & Gynecology | Admitting: Obstetrics & Gynecology

## 2015-05-18 ENCOUNTER — Encounter (HOSPITAL_COMMUNITY)
Admission: RE | Disposition: A | Discharge status: Home / Self Care | Payer: Self-pay | Source: Ambulatory Visit | Attending: Obstetrics & Gynecology

## 2015-05-18 ENCOUNTER — Encounter (HOSPITAL_COMMUNITY): Payer: Self-pay

## 2015-05-18 DIAGNOSIS — Z9851 Tubal ligation status: Secondary | ICD-10-CM | POA: Diagnosis not present

## 2015-05-18 DIAGNOSIS — N920 Excessive and frequent menstruation with regular cycle: Secondary | ICD-10-CM | POA: Insufficient documentation

## 2015-05-18 DIAGNOSIS — D649 Anemia, unspecified: Secondary | ICD-10-CM | POA: Diagnosis not present

## 2015-05-18 DIAGNOSIS — Z9889 Other specified postprocedural states: Secondary | ICD-10-CM

## 2015-05-18 DIAGNOSIS — N946 Dysmenorrhea, unspecified: Secondary | ICD-10-CM | POA: Diagnosis not present

## 2015-05-18 DIAGNOSIS — N939 Abnormal uterine and vaginal bleeding, unspecified: Principal | ICD-10-CM | POA: Insufficient documentation

## 2015-05-18 DIAGNOSIS — D251 Intramural leiomyoma of uterus: Secondary | ICD-10-CM | POA: Insufficient documentation

## 2015-05-18 DIAGNOSIS — D259 Leiomyoma of uterus, unspecified: Secondary | ICD-10-CM | POA: Diagnosis present

## 2015-05-18 DIAGNOSIS — N938 Other specified abnormal uterine and vaginal bleeding: Secondary | ICD-10-CM | POA: Diagnosis present

## 2015-05-18 HISTORY — PX: ROBOTIC ASSISTED TOTAL HYSTERECTOMY WITH BILATERAL SALPINGO OOPHERECTOMY: SHX6086

## 2015-05-18 SURGERY — ROBOTIC ASSISTED TOTAL HYSTERECTOMY WITH BILATERAL SALPINGO OOPHORECTOMY
Anesthesia: General | Site: Abdomen | Laterality: Right

## 2015-05-18 MED ORDER — LIDOCAINE HCL (CARDIAC) 20 MG/ML IV SOLN
INTRAVENOUS | Status: AC
  Filled 2015-05-18: qty 5

## 2015-05-18 MED ORDER — KETOROLAC TROMETHAMINE 30 MG/ML IJ SOLN
30.0000 mg | Freq: Once | INTRAMUSCULAR | Status: AC
  Administered 2015-05-18: 30 mg via INTRAVENOUS

## 2015-05-18 MED ORDER — CEFAZOLIN SODIUM-DEXTROSE 2-3 GM-% IV SOLR
INTRAVENOUS | Status: AC
Start: 2015-05-18 — End: 2015-05-18
  Filled 2015-05-18: qty 50

## 2015-05-18 MED ORDER — ONDANSETRON HCL 4 MG/2ML IJ SOLN
INTRAMUSCULAR | Status: DC | PRN
  Administered 2015-05-18: 4 mg via INTRAVENOUS

## 2015-05-18 MED ORDER — ONDANSETRON HCL 4 MG/2ML IJ SOLN: 4.0000 mg | Freq: Four times a day (QID) | INTRAMUSCULAR | Status: DC | PRN

## 2015-05-18 MED ORDER — ROCURONIUM BROMIDE 100 MG/10ML IV SOLN
INTRAVENOUS | Status: DC | PRN
  Administered 2015-05-18: 10 mg via INTRAVENOUS
  Administered 2015-05-18: 20 mg via INTRAVENOUS
  Administered 2015-05-18: 10 mg via INTRAVENOUS
  Administered 2015-05-18: 50 mg via INTRAVENOUS

## 2015-05-18 MED ORDER — KETOROLAC TROMETHAMINE 30 MG/ML IJ SOLN
30.0000 mg | Freq: Four times a day (QID) | INTRAMUSCULAR | Status: AC
  Administered 2015-05-18 – 2015-05-19 (×3): 30 mg via INTRAVENOUS
  Filled 2015-05-18 (×3): qty 1

## 2015-05-18 MED ORDER — IBUPROFEN 600 MG PO TABS
600.0000 mg | ORAL_TABLET | Freq: Four times a day (QID) | ORAL | Status: DC | PRN
  Filled 2015-05-18: qty 1

## 2015-05-18 MED ORDER — FENTANYL CITRATE (PF) 250 MCG/5ML IJ SOLN
INTRAMUSCULAR | Status: AC
  Filled 2015-05-18: qty 25

## 2015-05-18 MED ORDER — MAGNESIUM CITRATE PO SOLN
1.0000 | Freq: Once | ORAL | Status: AC | PRN
  Filled 2015-05-18: qty 296

## 2015-05-18 MED ORDER — OXYCODONE-ACETAMINOPHEN 5-325 MG PO TABS
1.0000 | ORAL_TABLET | ORAL | Status: DC | PRN
  Administered 2015-05-19: 1 via ORAL
  Filled 2015-05-18 (×2): qty 1

## 2015-05-18 MED ORDER — LACTATED RINGERS IV SOLN
INTRAVENOUS | Status: DC
Start: 2015-05-18 — End: 2015-05-18
  Administered 2015-05-18: 07:00:00 via INTRAVENOUS

## 2015-05-18 MED ORDER — DEXAMETHASONE SODIUM PHOSPHATE 10 MG/ML IJ SOLN
INTRAMUSCULAR | Status: DC | PRN
  Administered 2015-05-18: 4 mg via INTRAVENOUS

## 2015-05-18 MED ORDER — NEOSTIGMINE METHYLSULFATE 10 MG/10ML IV SOLN
INTRAVENOUS | Status: DC | PRN
  Administered 2015-05-18: 4 mg via INTRAVENOUS

## 2015-05-18 MED ORDER — FENTANYL CITRATE (PF) 100 MCG/2ML IJ SOLN
INTRAMUSCULAR | Status: DC | PRN
  Administered 2015-05-18: 50 ug via INTRAVENOUS
  Administered 2015-05-18 (×2): 100 ug via INTRAVENOUS
  Administered 2015-05-18: 150 ug via INTRAVENOUS
  Administered 2015-05-18: 100 ug via INTRAVENOUS

## 2015-05-18 MED ORDER — DEXTROSE-NACL 5-0.45 % IV SOLN: INTRAVENOUS | Status: DC

## 2015-05-18 MED ORDER — SIMETHICONE 80 MG PO CHEW: 80.0000 mg | CHEWABLE_TABLET | Freq: Four times a day (QID) | ORAL | Status: DC | PRN

## 2015-05-18 MED ORDER — SCOPOLAMINE 1 MG/3DAYS TD PT72
1.0000 | MEDICATED_PATCH | Freq: Once | TRANSDERMAL | Status: DC
  Administered 2015-05-18: 1.5 mg via TRANSDERMAL

## 2015-05-18 MED ORDER — SCOPOLAMINE 1 MG/3DAYS TD PT72
MEDICATED_PATCH | TRANSDERMAL | Status: DC
Start: 2015-05-18 — End: 2015-05-19
  Administered 2015-05-18: 1.5 mg via TRANSDERMAL
  Filled 2015-05-18: qty 1

## 2015-05-18 MED ORDER — GLYCOPYRROLATE 0.2 MG/ML IJ SOLN
INTRAMUSCULAR | Status: DC | PRN
  Administered 2015-05-18: 0.6 mg via INTRAVENOUS

## 2015-05-18 MED ORDER — BUPIVACAINE HCL (PF) 0.5 % IJ SOLN
INTRAMUSCULAR | Status: AC
  Filled 2015-05-18: qty 30

## 2015-05-18 MED ORDER — DEXAMETHASONE SODIUM PHOSPHATE 4 MG/ML IJ SOLN
INTRAMUSCULAR | Status: AC
  Filled 2015-05-18: qty 1

## 2015-05-18 MED ORDER — METHYLENE BLUE 1 % INJ SOLN
INTRAMUSCULAR | Status: DC | PRN
  Administered 2015-05-18: 7 mL via INTRAVENOUS

## 2015-05-18 MED ORDER — BISACODYL 5 MG PO TBEC
5.0000 mg | DELAYED_RELEASE_TABLET | Freq: Every day | ORAL | Status: DC | PRN
  Filled 2015-05-18: qty 1

## 2015-05-18 MED ORDER — PROPOFOL 10 MG/ML IV BOLUS
INTRAVENOUS | Status: DC | PRN
Start: 2015-05-18 — End: 2015-05-18
  Administered 2015-05-18: 150 mg via INTRAVENOUS

## 2015-05-18 MED ORDER — ONDANSETRON HCL 4 MG/2ML IJ SOLN
INTRAMUSCULAR | Status: AC
  Filled 2015-05-18: qty 2

## 2015-05-18 MED ORDER — METHYLENE BLUE 1 % INJ SOLN
INTRAMUSCULAR | Status: AC
  Filled 2015-05-18: qty 10

## 2015-05-18 MED ORDER — LACTATED RINGERS IR SOLN
Status: DC | PRN
  Administered 2015-05-18: 3000 mL

## 2015-05-18 MED ORDER — PANTOPRAZOLE SODIUM 40 MG PO TBEC
40.0000 mg | DELAYED_RELEASE_TABLET | Freq: Every day | ORAL | Status: DC
  Administered 2015-05-19: 40 mg via ORAL
  Filled 2015-05-18: qty 1

## 2015-05-18 MED ORDER — STERILE WATER FOR IRRIGATION IR SOLN
Status: DC | PRN
  Administered 2015-05-18: 3000 mL

## 2015-05-18 MED ORDER — BUPIVACAINE HCL 0.5 % IJ SOLN
INTRAMUSCULAR | Status: DC | PRN
  Administered 2015-05-18: 30 mL

## 2015-05-18 MED ORDER — LACTATED RINGERS IV SOLN
INTRAVENOUS | Status: DC
  Administered 2015-05-18: 125 mL/h via INTRAVENOUS

## 2015-05-18 MED ORDER — MIDAZOLAM HCL 2 MG/2ML IJ SOLN
INTRAMUSCULAR | Status: DC | PRN
  Administered 2015-05-18: 2 mg via INTRAVENOUS

## 2015-05-18 MED ORDER — DOCUSATE SODIUM 100 MG PO CAPS
100.0000 mg | ORAL_CAPSULE | Freq: Two times a day (BID) | ORAL | Status: DC
  Administered 2015-05-18 – 2015-05-19 (×2): 100 mg via ORAL
  Filled 2015-05-18 (×2): qty 1

## 2015-05-18 MED ORDER — LIDOCAINE HCL (CARDIAC) 20 MG/ML IV SOLN
INTRAVENOUS | Status: DC | PRN
  Administered 2015-05-18: 60 mg via INTRAVENOUS

## 2015-05-18 MED ORDER — KETOROLAC TROMETHAMINE 30 MG/ML IJ SOLN: 30.0000 mg | Freq: Four times a day (QID) | INTRAMUSCULAR | Status: AC

## 2015-05-18 MED ORDER — ONDANSETRON HCL 4 MG PO TABS: 4.0000 mg | ORAL_TABLET | Freq: Four times a day (QID) | ORAL | Status: DC | PRN

## 2015-05-18 MED ORDER — EPHEDRINE SULFATE 50 MG/ML IJ SOLN
INTRAMUSCULAR | Status: DC | PRN
  Administered 2015-05-18: 10 mg via INTRAVENOUS

## 2015-05-18 MED ORDER — PROPOFOL 10 MG/ML IV BOLUS
INTRAVENOUS | Status: AC
  Filled 2015-05-18: qty 20

## 2015-05-18 MED ORDER — ROCURONIUM BROMIDE 100 MG/10ML IV SOLN
INTRAVENOUS | Status: AC
  Filled 2015-05-18: qty 1

## 2015-05-18 MED ORDER — METOCLOPRAMIDE HCL 5 MG/ML IJ SOLN: 10.0000 mg | Freq: Once | INTRAMUSCULAR | Status: DC | PRN

## 2015-05-18 MED ORDER — HYDROMORPHONE HCL 1 MG/ML IJ SOLN
0.2500 mg | INTRAMUSCULAR | Status: DC | PRN
  Administered 2015-05-18: 0.5 mg via INTRAVENOUS

## 2015-05-18 MED ORDER — MEPERIDINE HCL 25 MG/ML IJ SOLN: 6.2500 mg | INTRAMUSCULAR | Status: DC | PRN

## 2015-05-18 MED ORDER — KETOROLAC TROMETHAMINE 30 MG/ML IJ SOLN
INTRAMUSCULAR | Status: AC
  Filled 2015-05-18: qty 1

## 2015-05-18 MED ORDER — CEFAZOLIN SODIUM-DEXTROSE 2-3 GM-% IV SOLR
2.0000 g | INTRAVENOUS | Status: AC
  Administered 2015-05-18: 2 g via INTRAVENOUS

## 2015-05-18 MED ORDER — HYDROMORPHONE HCL 1 MG/ML IJ SOLN
INTRAMUSCULAR | Status: AC
Start: 2015-05-18 — End: 2015-05-19
  Filled 2015-05-18: qty 1

## 2015-05-18 MED ORDER — HYDROMORPHONE HCL 1 MG/ML IJ SOLN: 0.2000 mg | INTRAMUSCULAR | Status: DC | PRN

## 2015-05-18 MED ORDER — MIDAZOLAM HCL 2 MG/2ML IJ SOLN
INTRAMUSCULAR | Status: AC
  Filled 2015-05-18: qty 4

## 2015-05-18 SURGICAL SUPPLY — 62 items
APPLIER CLIP 5 13 M/L LIGAMAX5 (MISCELLANEOUS)
BARRIER ADHS 3X4 INTERCEED (GAUZE/BANDAGES/DRESSINGS) IMPLANT
BENZOIN TINCTURE PRP APPL 2/3 (GAUZE/BANDAGES/DRESSINGS) ×3 IMPLANT
CATH FOLEY 3WAY  5CC 16FR (CATHETERS) ×2
CATH FOLEY 3WAY 5CC 16FR (CATHETERS) ×1 IMPLANT
CLIP APPLIE 5 13 M/L LIGAMAX5 (MISCELLANEOUS) IMPLANT
CLOSURE WOUND 1/2 X4 (GAUZE/BANDAGES/DRESSINGS) ×1
CLOTH BEACON ORANGE TIMEOUT ST (SAFETY) ×3 IMPLANT
CONT PATH 16OZ SNAP LID 3702 (MISCELLANEOUS) ×3 IMPLANT
COVER BACK TABLE 60X90IN (DRAPES) ×6 IMPLANT
COVER TIP SHEARS 8 DVNC (MISCELLANEOUS) ×1 IMPLANT
COVER TIP SHEARS 8MM DA VINCI (MISCELLANEOUS) ×2
DECANTER SPIKE VIAL GLASS SM (MISCELLANEOUS) ×3 IMPLANT
DEVICE TROCAR PUNCTURE CLOSURE (ENDOMECHANICALS) IMPLANT
DRAPE WARM FLUID 44X44 (DRAPE) ×3 IMPLANT
DRESSING OPSITE X SMALL 2X3 (GAUZE/BANDAGES/DRESSINGS) ×3 IMPLANT
DRSG COVADERM PLUS 2X2 (GAUZE/BANDAGES/DRESSINGS) ×12 IMPLANT
DRSG OPSITE POSTOP 3X4 (GAUZE/BANDAGES/DRESSINGS) ×3 IMPLANT
DURAPREP 26ML APPLICATOR (WOUND CARE) ×3 IMPLANT
ELECT REM PT RETURN 9FT ADLT (ELECTROSURGICAL) ×3
ELECTRODE REM PT RTRN 9FT ADLT (ELECTROSURGICAL) ×1 IMPLANT
GAUZE VASELINE 3X9 (GAUZE/BANDAGES/DRESSINGS) IMPLANT
GLOVE BIO SURGEON STRL SZ7 (GLOVE) ×6 IMPLANT
GLOVE BIOGEL PI IND STRL 7.0 (GLOVE) ×4 IMPLANT
GLOVE BIOGEL PI INDICATOR 7.0 (GLOVE) ×8
KIT ACCESSORY DA VINCI DISP (KITS) ×2
KIT ACCESSORY DVNC DISP (KITS) ×1 IMPLANT
LEGGING LITHOTOMY PAIR STRL (DRAPES) ×3 IMPLANT
LIQUID BAND (GAUZE/BANDAGES/DRESSINGS) ×3 IMPLANT
NEEDLE HYPO 22GX1.5 SAFETY (NEEDLE) ×3 IMPLANT
NEEDLE INSUFFLATION 120MM (ENDOMECHANICALS) ×3 IMPLANT
OCCLUDER COLPOPNEUMO (BALLOONS) ×3 IMPLANT
PACK ROBOT WH (CUSTOM PROCEDURE TRAY) ×3 IMPLANT
PACK ROBOTIC GOWN (GOWN DISPOSABLE) ×3 IMPLANT
PAD POSITIONER PINK NONSTERILE (MISCELLANEOUS) ×3 IMPLANT
PAD PREP 24X48 CUFFED NSTRL (MISCELLANEOUS) ×6 IMPLANT
SCISSORS LAP 5X35 DISP (ENDOMECHANICALS) ×3 IMPLANT
SET CYSTO W/LG BORE CLAMP LF (SET/KITS/TRAYS/PACK) ×9 IMPLANT
SET IRRIG TUBING LAPAROSCOPIC (IRRIGATION / IRRIGATOR) ×6 IMPLANT
SET TRI-LUMEN FLTR TB AIRSEAL (TUBING) ×3 IMPLANT
STRIP CLOSURE SKIN 1/2X4 (GAUZE/BANDAGES/DRESSINGS) ×2 IMPLANT
SURGIFLO W/THROMBIN 8M KIT (HEMOSTASIS) IMPLANT
SUT VIC AB 0 CT1 27 (SUTURE) ×4
SUT VIC AB 0 CT1 27XBRD ANBCTR (SUTURE) ×2 IMPLANT
SUT VIC AB 0 CT1 27XBRD ANTBC (SUTURE) IMPLANT
SUT VICRYL 0 UR6 27IN ABS (SUTURE) ×6 IMPLANT
SUT VICRYL 4-0 PS2 18IN ABS (SUTURE) ×6 IMPLANT
SUT VLOC 180 0 9IN  GS21 (SUTURE) ×4
SUT VLOC 180 0 9IN GS21 (SUTURE) ×2 IMPLANT
SYR CONTROL 10ML LL (SYRINGE) ×3 IMPLANT
SYSTEM CONVERTIBLE TROCAR (TROCAR) IMPLANT
TIP RUMI ORANGE 6.7MMX12CM (TIP) ×3 IMPLANT
TIP UTERINE 5.1X6CM LAV DISP (MISCELLANEOUS) IMPLANT
TIP UTERINE 6.7X10CM GRN DISP (MISCELLANEOUS) IMPLANT
TIP UTERINE 6.7X6CM WHT DISP (MISCELLANEOUS) IMPLANT
TIP UTERINE 6.7X8CM BLUE DISP (MISCELLANEOUS) IMPLANT
TOWEL OR 17X24 6PK STRL BLUE (TOWEL DISPOSABLE) ×6 IMPLANT
TROCAR DILATING TIP 12MM 150MM (ENDOMECHANICALS) ×3 IMPLANT
TROCAR DISP BLADELESS 8 DVNC (TROCAR) ×1 IMPLANT
TROCAR DISP BLADELESS 8MM (TROCAR) ×2
TROCAR PORT AIRSEAL 5X120 (TROCAR) ×3 IMPLANT
WATER STERILE IRR 1000ML POUR (IV SOLUTION) ×9 IMPLANT

## 2015-05-18 NOTE — Progress Notes (Signed)
Nursing 1700:  Pt up at side of bed. C/O dizziness, she got shaky and stated unable to walk at this time.  She rates her pain low, 2-3.  I left the foley in, until she is able to ambulate to bathroom. Urine is clearing (green-yellow) output adequate and  she is taking PO's well. MT Thalia Bloodgood RN

## 2015-05-18 NOTE — Addendum Note (Signed)
Addendum  created 05/18/15 1613 by Hewitt Blade, CRNA   Modules edited: Notes Section   Notes Section:  File: [id number]

## 2015-05-18 NOTE — Anesthesia Postprocedure Evaluation (Signed)
  Anesthesia Post-op Note  Patient: Debbie [name]  Procedure(s) Performed: Procedure(s): ROBOTIC ASSISTED LAPAROSCOPIC  TOTAL HYSTERECTOMY WITH RIGHT SALPINGECTOMY, LYSIS OF ADHESIONS, CYSTOSCOPY (Right)  Patient Location: PACU  Anesthesia Type:General  Level of Consciousness: awake, alert  and oriented  Airway and Oxygen Therapy: Patient Spontanous Breathing  Post-op Pain: mild  Post-op Assessment: Post-op Vital signs reviewed, Patient's Cardiovascular Status Stable, Respiratory Function Stable, Patent Airway, No signs of Nausea or vomiting and Pain level controlled              Post-op Vital Signs: Reviewed and stable  Last Vitals:  Filed Vitals:   05/18/15 1230  BP: 96/53  Pulse: 72  Temp:   Resp: 15    Complications: No apparent anesthesia complications

## 2015-05-18 NOTE — Transfer of Care (Signed)
Immediate Anesthesia Transfer of Care Note  Patient: Debbie [name]  Procedure(s) Performed: Procedure(s): ROBOTIC ASSISTED LAPAROSCOPIC  TOTAL HYSTERECTOMY WITH RIGHT SALPINGECTOMY, LYSIS OF ADHESIONS, CYSTOSCOPY (Right)  Patient Location: PACU  Anesthesia Type:General  Level of Consciousness: awake, alert  and oriented  Airway & Oxygen Therapy: Patient Spontanous Breathing and Patient connected to nasal cannula oxygen  Post-op Assessment: Report given to RN and Post -op Vital signs reviewed and stable  Post vital signs: Reviewed and stable  Last Vitals:  Filed Vitals:   05/18/15 0632  BP: 124/82  Pulse: 70  Temp: 36.9 C  Resp: 16    Complications: No apparent anesthesia complications

## 2015-05-18 NOTE — Anesthesia Procedure Notes (Signed)
Procedure Name: Intubation Date/Time: 05/18/2015 7:31 AM Performed by: Jonna Munro Pre-anesthesia Checklist: Patient identified, Emergency Drugs available, Suction available, Patient being monitored and Timeout performed Patient Re-evaluated:Patient Re-evaluated prior to inductionOxygen Delivery Method: Circle system utilized Preoxygenation: Pre-oxygenation with 100% oxygen Intubation Type: IV induction Ventilation: Mask ventilation without difficulty Laryngoscope Size: Mac and 3 Grade View: Grade I Tube type: Oral Tube size: 7.0 mm Number of attempts: 1 Airway Equipment and Method: Stylet Placement Confirmation: ETT inserted through vocal cords under direct vision,  positive ETCO2 and breath sounds checked- equal and bilateral Secured at: 21 cm Tube secured with: Tape Dental Injury: Teeth and Oropharynx as per pre-operative assessment

## 2015-05-18 NOTE — Op Note (Signed)
05/18/2015  11:14 AM  PATIENT:  Debbie [name]  44 y.o. female  PRE-OPERATIVE DIAGNOSIS:  Abnormal uterine bleeding  POST-OPERATIVE DIAGNOSIS:  Abnormal uterine bleeding  PROCEDURE:  Procedure(s): ROBOTIC ASSISTED LAPAROSCOPIC  TOTAL HYSTERECTOMY WITH RIGHT SALPINGECTOMY, LYSIS OF ADHESIONS, CYSTOSCOPY   SURGEON:  Surgeon(s) and Role:    * Lavonia Drafts, MD - Primary    * Osborne Oman, MD - Assisting  ANESTHESIA:   general  EBL:  Total I/O In: 1300 [I.V.:1300] Out: 550 [Urine:300; Blood:250]  BLOOD ADMINISTERED:none  DRAINS: none   LOCAL MEDICATIONS USED:  MARCAINE     SPECIMEN:  Source of Specimen:  uterine, cervix right fallopian tube    DISPOSITION OF SPECIMEN:  PATHOLOGY  COUNTS:  YES  TOURNIQUET:  * No tourniquets in log *  DICTATION: .Note written in EPIC  PLAN OF CARE: Admit for overnight observation  PATIENT DISPOSITION:  PACU - hemodynamically stable.   Delay start of Pharmacological VTE agent (>24hrs) due to surgical blood loss or risk of bleeding: yes  Complications: none immediate  Indications: multiple gravida with history of two prior cesarean sections with AUB.  Patient requests definitive treatment with hysterectomy.    Findings: enlarged uterus sounded to 13 cm. Uterus adherent to ant abdominal well.  There were filmy adhesion of bowel to the pelvic sidewall and there were omental adhesions to the anterior abdominal wall and the uterus and adnexa.  Adhesion of the bladder to the uterus and cervix   The risks, benefits, and alternatives of surgery were explained, understood, and accepted. Consents were signed. All questions were answered. She was taken to the operating room and general anesthesia was applied without complication. She was placed in the dorsal lithotomy position and her abdomen and vagina were prepped and draped after she had been carefully positioned on the table. A bimanual exam revealed a 14 week size uterus that was  anteverted. Her adnexa were not enlarged. The cervix was measured and the uterus was sounded to 12 cm. A Rumi uterine manipulator was placed without difficulty. A Foley catheter was placed and it drained clear throughout the case. Gloves were changed and attention was turned to the abdomen. A 109mm incision was made in the umbilicus and a Veress needle was placed intraperitoneally. CO2 was used to insufflate the abdomen to approximately 4 L. After good pneumoperitoneum was established, a 12 mm trocar was placed 09OB above the umbilicus.   Laparoscopy confirmed correct placement. She was placed in Trendelenburg position and ports were placed in appropriate positions on her abdomen to allow maximum exposure during the robotic case. Specifically there was an 85mm assistant port placed in the left upper quadrant under direct laproscopic visualization. Two 8 mm ports were placed 8cm in the lower quadrants bilaterally.  These were all placed under direct laparoscopic visualization. The pelvis was inspected with the above findings.   The adhesions were resected using laparoscopic shears prior to docking the robot.   The robot was docked and I proceeded with a robotic portion of the case. The fallopian tubes and ovaries were found to be normal. I excised the fallopian tubes bilaterally. The round ligament on each side was cauterized and cut. The PK/gyrus instrument was used for this portion. The round ligaments were identified, cauterized and ligated.  The uterus was dissected from the anterior abdominal wall after the bladder was backfilled to identify the bladder from the uterus.  Once the bladder was fully identified the adhesion were sharply dissected away until a  bladder flap could be created anteriorly. The uterine vessels were identified and cauterized and then cut.The bladder was pushed out of the operative site and an anterior colpotomy was made. The colpotomy incision was extended circumferentially, following the  blue outline of the Rumi manipulator. All pedicles were hemostatic.  The uterus was removed from the vagina with the right fallopian tube segment.  Th eleft fallopian tube was resected but, was not able to be located at the end of the procedure.  It was left in situ. The vaginal cuff was closed with v-lock suture.  Excellent hemostasis was noted throughout. The pelvis was irrigated. The intraabdominal pressure was lowered assess hemostasis. After determining excellent hemostasis, the robot was undocked. At this point I performed cystoscopy. The cystoscopy revealed blue ejection from both ureters.  Another attempt was made to locate the detached left fallopian tube without success.  Excellent hemostasis was noted.  All the instruments were removed from the pts abdomen.  The midline fascial incision was closed with 0 vicryl.  The skin from all of the other ports was closed with 3-0 vicryl. 30cc of 0.5% Marcaine was injected into the port sites.  The ports sites were then covered with Dermabond.  The patient was then extubated and taken to recovery in stable condition.   Sponge, lap and needle counts were correct x 2.  Aalina Brege L. Harraway-Smith, M.D., [facility]

## 2015-05-18 NOTE — Anesthesia Postprocedure Evaluation (Signed)
  Anesthesia Post-op Note  Patient: Debbie [name]  Procedure(s) Performed: Procedure(s): ROBOTIC ASSISTED LAPAROSCOPIC  TOTAL HYSTERECTOMY WITH RIGHT SALPINGECTOMY, LYSIS OF ADHESIONS, CYSTOSCOPY (Right)  Patient Location: [facility]  Anesthesia Type:General  Level of Consciousness: awake, alert  and oriented  Airway and Oxygen Therapy: Patient Spontanous Breathing  Post-op Pain: none  Post-op Assessment: Post-op Vital signs reviewed and Patient's Cardiovascular Status Stable              Post-op Vital Signs: Reviewed and stable  Last Vitals:  Filed Vitals:   05/18/15 1415  BP: 103/72  Pulse: 82  Temp: 36.7 C  Resp: 18    Complications: No apparent anesthesia complications

## 2015-05-18 NOTE — H&P (Signed)
Preoperative History and Physical  Debbie [name] is a 44 y.o. [id number] here for surgical management of AUB.   Proposed surgery: Robotic assisted total laparoscopic hysterectomy with bilateral salpingectomy  Past Medical History  Diagnosis Date  . Headache(784.0)   . Anemia   . Dysmenorrhea   . Hypoglycemia     possible per pt- becomes shaky and weak when hungry   Past Surgical History  Procedure Laterality Date  . Tubal ligation    . Cesarean section      X2   OB History    Gravida Para Term Preterm AB TAB SAB Ectopic Multiple Living   4 4 4  0 0 0 0 0 0 4     Patient denies any cervical dysplasia or STIs. Prescriptions prior to admission  Medication Sig Dispense Refill Last Dose  . Fe Fum-FePoly-FA-Vit C-Vit B3 (INTEGRA F) 125-1 MG CAPS Take 1 capsule by mouth daily. 30 capsule 3 05/17/2015 at 0700  . megestrol (MEGACE) 40 MG tablet Take 1 tablet (40 mg total) by mouth 2 (two) times daily. 60 tablet 1 05/17/2015 at 0700  . norethindrone-ethinyl estradiol (JUNEL FE 1/20) 1-20 MG-MCG tablet Take 1 tablet by mouth daily. 3 Package 4 05/17/2015 at 1000    Allergies  Allergen Reactions  . Penicillins Other (See Comments)    Not sure if she has true allergy to Penicillin but her father told her that she or one of her sisters had a reaction.   Social History:   reports that she has never smoked. She has never used smokeless tobacco. She reports that she does not drink alcohol or use illicit drugs. Family History  Problem Relation Age of Onset  . Anesthesia problems Neg Hx   . Cancer Paternal Aunt 31    BREAST CANCER  . Cancer Cousin     [name]  . Cancer Paternal Grandfather     prostate    Review of Systems: Noncontributory  PHYSICAL EXAM: Blood pressure 124/82, pulse 70, temperature 98.4 F (36.9 C), temperature source Oral, resp. rate 16, SpO2 100 %. General appearance - alert, well appearing, and in no distress Chest - clear to auscultation, no wheezes,  rales or rhonchi, symmetric air entry Heart - normal rate and regular rhythm Abdomen - soft, nontender, nondistended, no masses or organomegaly Pelvic - examination not indicated Extremities - peripheral pulses normal, no pedal edema, no clubbing or cyanosis  Labs: Results for orders placed or performed during the hospital encounter of 05/04/15 (from the past 336 hour(s))  CBC   Collection Time: 05/04/15  8:05 AM  Result Value Ref Range   WBC 7.2 4.0 - 10.5 K/uL   RBC 5.16 (H) 3.87 - 5.11 MIL/uL   Hemoglobin 11.8 (L) 12.0 - 15.0 g/dL   HCT 38.8 36.0 - 46.0 %   MCV 75.2 (L) 78.0 - 100.0 fL   MCH 22.9 (L) 26.0 - 34.0 pg   MCHC 30.4 30.0 - 36.0 g/dL   RDW 27.4 (H) 11.5 - 15.5 %   Platelets 328 150 - 400 K/uL  Type and screen   Collection Time: 05/04/15  8:05 AM  Result Value Ref Range   ABO/RH(D) O POS    Antibody Screen NEG    Sample Expiration 05/18/2015   ABO/Rh   Collection Time: 05/04/15  9:00 AM  Result Value Ref Range   ABO/RH(D) O POS    03/30/2015 CLINICAL DATA: Menorrhagia and dysmenorrhea. Prior tubal ligation.  EXAM: TRANSABDOMINAL AND TRANSVAGINAL ULTRASOUND OF PELVIS  TECHNIQUE: Both transabdominal and transvaginal ultrasound examinations of the pelvis were performed. Transabdominal technique was performed for global imaging of the pelvis including uterus, ovaries, adnexal regions, and pelvic cul-de-sac. It was necessary to proceed with endovaginal exam following the transabdominal exam to visualize the endometrium and ovaries.  COMPARISON: 12/05/2011  FINDINGS: Uterus  Measurements: 13.4 x 8.4 x 6.6 cm, anteverted, retroflexed. Apparent interval development of left lateral dominant uterine body fibroid with mass effect upon the endometrium measuring 4.7 x 4.5 x 4.4 cm. Right lateral intramural fibroid measures 1.6 x 1.4 x 1.3 cm.  Endometrium  Thickness: 5 mm. No focal abnormality, trace fluid within the endometrial canal  Right  ovary  Measurements: 4.1 x 2.3 x 1.8 cm. Normal appearance/no adnexal mass.  Left ovary  Measurements: 2.4 x 3.1 x 2.8 cm. Normal appearance/no adnexal mass.  Other findings  Trace free fluid  IMPRESSION: No focal endometrial abnormality to explain the history of uterine bleeding. However, there is a dominant left lateral uterine body intramural fibroid with mass effect upon the endometrium and a submucosal component could account for the patient's symptoms. Consider further evaluation with pelvic MRI with contrast as well as surveillance ultrasound in 6-12 months due to apparent interval development of this finding since the prior 2013 exam.   Assessment: Patient Active Problem List   Diagnosis Date Noted  . Fibroid uterus 08/25/2013  . Anemia 03/13/2012  . DUB (dysfunctional uterine bleeding) 12/28/2011    Plan: Patient will undergo surgical management with Robotic assisted total laparoscopic hysterectomy with bilateral salpingectomy.   The risks of surgery were discussed in detail with the patient including but not limited to: bleeding which may require transfusion or reoperation; infection which may require antibiotics; injury to surrounding organs which may involve bowel, bladder, ureters ; need for additional procedures including laparoscopy or laparotomy; thromboembolic phenomenon, surgical site problems and other postoperative/anesthesia complications. Likelihood of success in alleviating the patient's condition was discussed. Routine postoperative instructions will be reviewed with the patient and her family in detail after surgery.  The patient concurred with the proposed plan, giving informed written consent for the surgery.  Patient has been NPO since last night she will remain NPO for procedure.  Anesthesia and OR aware.  Preoperative prophylactic antibiotics and SCDs ordered on call to the OR.  To OR when ready.  Bernhard Koskinen L. Harraway-Smith, M.D., [facility] 05/18/2015  7:09 AM

## 2015-05-18 NOTE — Brief Op Note (Signed)
05/18/2015  11:14 AM  PATIENT:  Debbie [name]  44 y.o. female  PRE-OPERATIVE DIAGNOSIS:  Abnormal uterine bleeding  POST-OPERATIVE DIAGNOSIS:  Abnormal uterine bleeding  PROCEDURE:  Procedure(s): ROBOTIC ASSISTED LAPAROSCOPIC  TOTAL HYSTERECTOMY WITH RIGHT SALPINGECTOMY, LYSIS OF ADHESIONS, CYSTOSCOPY (Right)  SURGEON:  Surgeon(s) and Role:    * Lavonia Drafts, MD - Primary    * Osborne Oman, MD - Assisting  ANESTHESIA:   general  EBL:  Total I/O In: 1300 [I.V.:1300] Out: 550 [Urine:300; Blood:250]  BLOOD ADMINISTERED:none  DRAINS: none   LOCAL MEDICATIONS USED:  MARCAINE     SPECIMEN:  Source of Specimen:  uterine, cervix right fallopian tube    DISPOSITION OF SPECIMEN:  PATHOLOGY  COUNTS:  YES  TOURNIQUET:  * No tourniquets in log *  DICTATION: .Note written in EPIC  PLAN OF CARE: Admit for overnight observation  PATIENT DISPOSITION:  PACU - hemodynamically stable.   Delay start of Pharmacological VTE agent (>24hrs) due to surgical blood loss or risk of bleeding: yes  Complications: none immediate

## 2015-05-19 ENCOUNTER — Encounter (HOSPITAL_COMMUNITY): Payer: Self-pay | Admitting: Obstetrics & Gynecology

## 2015-05-19 DIAGNOSIS — N939 Abnormal uterine and vaginal bleeding, unspecified: Secondary | ICD-10-CM | POA: Diagnosis not present

## 2015-05-19 DIAGNOSIS — N938 Other specified abnormal uterine and vaginal bleeding: Secondary | ICD-10-CM

## 2015-05-19 DIAGNOSIS — D259 Leiomyoma of uterus, unspecified: Secondary | ICD-10-CM | POA: Diagnosis not present

## 2015-05-19 DIAGNOSIS — D500 Iron deficiency anemia secondary to blood loss (chronic): Secondary | ICD-10-CM | POA: Diagnosis not present

## 2015-05-19 MED ORDER — OXYCODONE-ACETAMINOPHEN 5-325 MG PO TABS: 1.0000 | ORAL_TABLET | ORAL | Status: DC | PRN

## 2015-05-19 MED ORDER — DOCUSATE SODIUM 100 MG PO CAPS: 100.0000 mg | ORAL_CAPSULE | Freq: Two times a day (BID) | ORAL | Status: DC

## 2015-05-19 MED ORDER — IBUPROFEN 600 MG PO TABS: 600.0000 mg | ORAL_TABLET | Freq: Four times a day (QID) | ORAL | Status: DC | PRN

## 2015-05-19 NOTE — Discharge Instructions (Signed)
Total Laparoscopic Hysterectomy, Care After °Refer to this sheet in the next few weeks. These instructions provide you with information on caring for yourself after your procedure. Your health care provider may also give you more specific instructions. Your treatment has been planned according to current medical practices, but problems sometimes occur. Call your health care provider if you have any problems or questions after your procedure. °WHAT TO EXPECT AFTER THE PROCEDURE °· Pain and bruising at the incision sites. You will be given pain medicine to control it. °· Menopausal symptoms such as hot flashes, night sweats, and insomnia if your ovaries were removed. °· Sore throat from the breathing tube that was inserted during surgery. °HOME CARE INSTRUCTIONS °· Only take over-the-counter or prescription medicines for pain, discomfort, or fever as directed by your health care provider.   °· Do not take aspirin. It can cause bleeding.   °· Do not drive when taking pain medicine.   °· Follow your health care provider's advice regarding diet, exercise, lifting, driving, and general activities.   °· Resume your usual diet as directed and allowed.   °· Get plenty of rest and sleep.   °· Do not douche, use tampons, or have sexual intercourse for at least 6 weeks, or until your health care provider gives you permission.   °· Change your bandages (dressings) as directed by your health care provider.   °· Monitor your temperature and notify your health care provider of a fever.   °· Take showers instead of baths for 2-3 weeks.   °· Do not drink alcohol until your health care provider gives you permission.   °· If you develop constipation, you may take a mild laxative with your health care provider's permission. Bran foods may help with constipation problems. Drinking enough fluids to keep your urine clear or pale yellow may help as well.   °· Try to have someone home with you for 1-2 weeks to help around the house.    °· Keep all of your follow-up appointments as directed by your health care provider.   °SEEK MEDICAL CARE IF: °· You have swelling, redness, or increasing pain around your incision sites.   °· You have pus coming from your incision.   °· You notice a bad smell coming from your incision.   °· Your incision breaks open.   °· You feel dizzy or lightheaded.   °· You have pain or bleeding when you urinate.   °· You have persistent diarrhea.   °· You have persistent nausea and vomiting.   °· You have abnormal vaginal discharge.   °· You have a rash.   °· You have any type of abnormal reaction or develop an allergy to your medicine.   °· You have poor pain control with your prescribed medicine.   °SEEK IMMEDIATE MEDICAL CARE IF: °· You have chest pain or shortness of breath. °· You have severe abdominal pain that is not relieved with pain medicine. °· You have pain or swelling in your legs. °MAKE SURE YOU: °· Understand these instructions. °· Will watch your condition. °· Will get help right away if you are not doing well or get worse. °Document Released: 07/23/2013 Document Revised: 10/07/2013 Document Reviewed: 07/23/2013 °ExitCare® Patient Information ©2015 [employer]. This information is not intended to replace advice given to you by your health care provider. Make sure you discuss any questions you have with your health care provider. ° °

## 2015-05-19 NOTE — Progress Notes (Signed)
Patient discharged home with daughter... Discharge instructions reviewed with patient and she verbalized understanding... Condition stable... No equipment... Ambulated to car with Santiago Bur, NT.

## 2015-05-19 NOTE — Discharge Summary (Signed)
Physician Discharge Summary  Patient ID: Debbie [name] MRN: [id number] DOB/AGE: December 08, 1970 44 y.o.  Admit date: 05/18/2015 Discharge date: 05/19/2015  Admission Diagnoses: AUB; fibroids  Discharge Diagnoses:  Principal Problem:   DUB (dysfunctional uterine bleeding) Active Problems:   Anemia   Fibroid uterus   Post-operative state   Discharged Condition: good  Hospital Course: Patient had an uncomplicated surgery; for further details of this surgery, please refer to the operative note. Furthermore, the patient had an uncomplicated postoperative course.  By time of discharge, her pain was controlled on oral pain medications; she was ambulating, voiding without difficulty, tolerating regular diet and passing flatus.  She was deemed stable for discharge to home.  I reviewed her surgery with her.  Surg path to follow.  Pt reports that she is voiding well and walking without difficulty.    Consults: None  Significant Diagnostic Studies: n/a  Treatments: surgery: Robot assisted total laparoscopic hysterectomy with right salpingectomy and cystoscopy    Discharge Exam: Blood pressure 112/69, pulse 80, temperature 98.7 F (37.1 C), temperature source Oral, resp. rate 14, height 5\' 1"  (1.549 m), weight 153 lb (69.4 kg), SpO2 100 %. General appearance: alert and no distress Resp: clear to auscultation bilaterally Cardio: regular rate and rhythm, S1, S2 normal, no murmur, click, rub or gallop GI: soft, non-tender; bowel sounds normal; no masses,  no organomegaly Extremities: extremities normal, atraumatic, no cyanosis or edema Incision/Wound: ports sited clean and dry  Disposition: 01-Home or Self Care  Discharge Instructions    Call MD for:  difficulty breathing, headache or visual disturbances    Complete by:  As directed      Call MD for:  extreme fatigue    Complete by:  As directed      Call MD for:  hives    Complete by:  As directed      Call MD for:  persistant dizziness or  light-headedness    Complete by:  As directed      Call MD for:  persistant nausea and vomiting    Complete by:  As directed      Call MD for:  redness, tenderness, or signs of infection (pain, swelling, redness, odor or green/yellow discharge around incision site)    Complete by:  As directed      Call MD for:  severe uncontrolled pain    Complete by:  As directed      Call MD for:  temperature >100.4    Complete by:  As directed      Diet - low sodium heart healthy    Complete by:  As directed      Discharge wound care:    Complete by:  As directed   Remove dressing in 1 week.   Do not remove glue on port sites     Driving Restrictions    Complete by:  As directed   No driving for 2 weeks or while on pain medications     Increase activity slowly    Complete by:  As directed      Lifting restrictions    Complete by:  As directed   No heavy lifting     Other Restrictions    Complete by:  As directed   Patient may shower.  No tub baths.     Sexual Activity Restrictions    Complete by:  As directed   Nothing in vagina for 8 (eight) weeks  Medication List    TAKE these medications        docusate sodium 100 MG capsule  Commonly known as:  COLACE  Take 1 capsule (100 mg total) by mouth 2 (two) times daily.     ibuprofen 600 MG tablet  Commonly known as:  ADVIL,MOTRIN  Take 1 tablet (600 mg total) by mouth every 6 (six) hours as needed (mild pain).     INTEGRA F 125-1 MG Caps  Take 1 capsule by mouth daily.     oxyCODONE-acetaminophen 5-325 MG per tablet  Commonly known as:  PERCOCET/ROXICET  Take 1-2 tablets by mouth every 4 (four) hours as needed (moderate to severe pain (when tolerating fluids)).           Follow-up Information    Follow up with Lavonia Drafts, MD In 2 weeks.   Specialty:  Obstetrics and Gynecology   Contact information:   [place] [address] [phone]       Signed: Lavonia Drafts 05/19/2015, 12:36 PM

## 2015-05-20 ENCOUNTER — Encounter: Payer: Self-pay | Admitting: Obstetrics & Gynecology

## 2015-06-02 ENCOUNTER — Ambulatory Visit (INDEPENDENT_AMBULATORY_CARE_PROVIDER_SITE_OTHER): Payer: BLUE CROSS/BLUE SHIELD | Admitting: Obstetrics & Gynecology

## 2015-06-02 ENCOUNTER — Encounter: Payer: Self-pay | Admitting: Obstetrics & Gynecology

## 2015-06-02 VITALS — BP 115/75 | HR 95 | Temp 99.3°F | Ht 61.0 in | Wt 153.7 lb

## 2015-06-02 DIAGNOSIS — Z9889 Other specified postprocedural states: Secondary | ICD-10-CM

## 2015-06-02 NOTE — Patient Instructions (Signed)
Total Laparoscopic Hysterectomy, Care After °Refer to this sheet in the next few weeks. These instructions provide you with information on caring for yourself after your procedure. Your health care provider may also give you more specific instructions. Your treatment has been planned according to current medical practices, but problems sometimes occur. Call your health care provider if you have any problems or questions after your procedure. °WHAT TO EXPECT AFTER THE PROCEDURE °· Pain and bruising at the incision sites. You will be given pain medicine to control it. °· Menopausal symptoms such as hot flashes, night sweats, and insomnia if your ovaries were removed. °· Sore throat from the breathing tube that was inserted during surgery. °HOME CARE INSTRUCTIONS °· Only take over-the-counter or prescription medicines for pain, discomfort, or fever as directed by your health care provider.   °· Do not take aspirin. It can cause bleeding.   °· Do not drive when taking pain medicine.   °· Follow your health care provider's advice regarding diet, exercise, lifting, driving, and general activities.   °· Resume your usual diet as directed and allowed.   °· Get plenty of rest and sleep.   °· Do not douche, use tampons, or have sexual intercourse for at least 6 weeks, or until your health care provider gives you permission.   °· Change your bandages (dressings) as directed by your health care provider.   °· Monitor your temperature and notify your health care provider of a fever.   °· Take showers instead of baths for 2-3 weeks.   °· Do not drink alcohol until your health care provider gives you permission.   °· If you develop constipation, you may take a mild laxative with your health care provider's permission. Bran foods may help with constipation problems. Drinking enough fluids to keep your urine clear or pale yellow may help as well.   °· Try to have someone home with you for 1-2 weeks to help around the house.    °· Keep all of your follow-up appointments as directed by your health care provider.   °SEEK MEDICAL CARE IF: °· You have swelling, redness, or increasing pain around your incision sites.   °· You have pus coming from your incision.   °· You notice a bad smell coming from your incision.   °· Your incision breaks open.   °· You feel dizzy or lightheaded.   °· You have pain or bleeding when you urinate.   °· You have persistent diarrhea.   °· You have persistent nausea and vomiting.   °· You have abnormal vaginal discharge.   °· You have a rash.   °· You have any type of abnormal reaction or develop an allergy to your medicine.   °· You have poor pain control with your prescribed medicine.   °SEEK IMMEDIATE MEDICAL CARE IF: °· You have chest pain or shortness of breath. °· You have severe abdominal pain that is not relieved with pain medicine. °· You have pain or swelling in your legs. °MAKE SURE YOU: °· Understand these instructions. °· Will watch your condition. °· Will get help right away if you are not doing well or get worse. °Document Released: 07/23/2013 Document Revised: 10/07/2013 Document Reviewed: 07/23/2013 °ExitCare® Patient Information ©2015 [employer]. This information is not intended to replace advice given to you by your health care provider. Make sure you discuss any questions you have with your health care provider. ° °

## 2015-06-02 NOTE — Progress Notes (Signed)
Patient ID: Geni Bers Colon, female   DOB: 10/14/1971, 44 y.o.   MRN: [id number] History:  44 y.o. [id number] here today for 2 week post op check.  She is without complaints. She is voiding and passing her bowels as prev.  She is tol reg diet.  The following portions of the patient's history were reviewed and updated as appropriate: allergies, current medications, past family history, past medical history, past social history, past surgical history and problem list.  Review of Systems:  Pertinent items are noted in HPI.  Objective:  Physical Exam Blood pressure 115/75, pulse 95, temperature 99.3 F (37.4 C), temperature source Oral, height 5\' 1"  (1.549 m), weight 153 lb 11.2 oz (69.718 kg). Gen: NAD Abd: Soft, nontender and nondistended Pelvic: Normal appearing external genitalia; normal appearing vaginal mucosa and cervix.  Normal discharge.  Small uterus, no other palpable masses, no uterine or adnexal tenderness  Labs and Imaging 05/18/2015 Diagnosis Uterus and cervix, with right fallopian tube INACTIVE ENDOMETRIUM, NEGATIVE FOR HYPERPLASIA OR MALIGNANCY. LEIOMYOMA X5 HISTOLOGICALLY UNREMARKABLE CERVIX NO FALLOPIAN TUBE IDENTIFIED   Assessment & Plan:  F/u in 4 weeks Gradual return to full activity No intercourse for 8 full weeks post op reviewed post op care and surgical procedure  Jayvyn Haselton L. Harraway-Smith, M.D., Cherlynn June

## 2015-07-03 ENCOUNTER — Emergency Department (INDEPENDENT_AMBULATORY_CARE_PROVIDER_SITE_OTHER)
Admission: EM | Admit: 2015-07-03 | Discharge: 2015-07-03 | Disposition: A | Discharge status: Home / Self Care | Payer: BLUE CROSS/BLUE SHIELD | Source: Home / Self Care | Attending: Family Medicine | Admitting: Family Medicine

## 2015-07-03 ENCOUNTER — Encounter (HOSPITAL_COMMUNITY): Payer: Self-pay | Admitting: Emergency Medicine

## 2015-07-03 DIAGNOSIS — R251 Tremor, unspecified: Secondary | ICD-10-CM

## 2015-07-03 LAB — GLUCOSE, CAPILLARY: GLUCOSE-CAPILLARY: 64 mg/dL — AB (ref 65–99)

## 2015-07-03 NOTE — ED Provider Notes (Signed)
CSN: [id number]     Arrival date & time 07/03/15  1305 History   First MD Initiated Contact with Patient 07/03/15 1431     Chief Complaint  Patient presents with  . Tremors  . Dizziness   (Consider location/radiation/quality/duration/timing/severity/associated sxs/prior Treatment) Patient is a 44 y.o. female presenting with dizziness. The history is provided by the patient (Visit conducted by Dr Lindell Noe in Spanish.). No language interpreter was used.  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  Had started her shift at 0730am.  Felt dizzy, bilat hand trembling.  No fevers or chills.  .  Onset quality: Sudden onset at 11am today at work.  Progression:  Improving Chronicity:  New Associated symptoms: no hearing loss, no palpitations and no shortness of breath     Past Medical History  Diagnosis Date  . Headache(784.0)   . Anemia   . Dysmenorrhea   . Hypoglycemia     possible per pt- becomes shaky and weak when hungry   Past Surgical History  Procedure Laterality Date  . Tubal ligation    . Cesarean section      X2  . Robotic assisted total hysterectomy with bilateral salpingo oopherectomy Right 05/18/2015    Procedure: ROBOTIC ASSISTED LAPAROSCOPIC  TOTAL HYSTERECTOMY WITH RIGHT SALPINGECTOMY, LYSIS OF ADHESIONS, CYSTOSCOPY;  Surgeon: Lavonia Drafts, MD;  Location: [facility];  Service: Gynecology;  Laterality: Right;   Family History  Problem Relation Age of Onset  . Anesthesia problems Neg Hx   . Cancer Paternal Aunt 55    BREAST CANCER  . Cancer Cousin     COLON.Marland Kitchen PATERNAL SIDE  . Cancer Paternal Grandfather     prostate   Social History  Substance Use Topics  . Smoking status: Never Smoker   . Smokeless tobacco: Never Used  . Alcohol Use: No   OB History    Gravida Para Term Preterm AB TAB SAB Ectopic Multiple Living   4 4 4  0 0 0 0 0 0 4      Review of Systems  Constitutional: Positive for fatigue. Negative for fever.  HENT: Positive for congestion and rhinorrhea. Negative for ear discharge, ear pain, hearing loss, postnasal drip and sinus pressure.   Eyes: Positive for redness. Negative for photophobia.  Respiratory: Negative for chest tightness and shortness of breath.   Cardiovascular: Negative for palpitations.  Neurological: Positive for dizziness.    Allergies  Penicillins  Home Medications   Prior to Admission medications   Medication Sig Start Date End Date Taking? Authorizing Provider  docusate sodium (COLACE) 100 MG capsule Take 1 capsule (100 mg total) by mouth 2 (two) times daily. Patient not taking: Reported on 06/02/2015 05/19/15   Lavonia Drafts, MD  Fe Fum-FePoly-FA-Vit C-Vit B3 (INTEGRA F) 125-1 MG CAPS Take 1 capsule by mouth daily. Patient not taking: Reported on 06/02/2015 03/24/15   Lavonia Drafts, MD  ibuprofen (ADVIL,MOTRIN) 600 MG tablet Take 1 tablet (600 mg total) by mouth every 6 (six) hours as needed (mild pain). Patient not taking: Reported on 06/02/2015 05/19/15   Lavonia Drafts, MD  oxyCODONE-acetaminophen (PERCOCET/ROXICET) 5-325 MG per tablet Take 1-2 tablets by mouth every 4 (four) hours as needed (moderate to severe pain (when tolerating fluids)). Patient not taking: Reported on 06/02/2015 05/19/15   Lavonia Drafts, MD   Meds Ordered and Administered this Visit  Medications - No data to display  BP 180/95  mmHg  Pulse 63  Temp(Src) 99.3 F (37.4 C) (Oral)  Resp 16  SpO2 100%  LMP  (LMP Unknown) No data found.   Physical Exam  Constitutional: She appears well-developed and well-nourished. No distress.  HENT:  Head: Normocephalic and atraumatic.  Right Ear: External ear normal.  Left Ear: External ear normal.  Mouth/Throat: No oropharyngeal exudate.  Eyes: EOM are normal. Right eye exhibits no discharge. Left eye exhibits no discharge. No scleral icterus.   Bilateral redness of conjunctivae, clear sclerae.   Neck: Neck supple. No tracheal deviation present. No thyromegaly present.  Cardiovascular: Normal rate, regular rhythm, normal heart sounds and intact distal pulses.  Exam reveals no gallop and no friction rub.   No murmur heard. Pulmonary/Chest: Effort normal and breath sounds normal. No stridor. No respiratory distress. She has no wheezes. She has no rales. She exhibits no tenderness.  Lymphadenopathy:    She has no cervical adenopathy.  Neurological:  Trace bilateral hand tremor at rest with hands in front. No motion tremor. Palpable radial pulses bilat. Sensation in fingers grossly intact.  Hand grip full and symmetric. No cogwheeling.  UEs full active ROM and strength, bilaterally.   Skin: She is not diaphoretic.    ED Course  Procedures (including critical care time)  Labs Review Labs Reviewed - No data to display  Imaging Review No results found.   Visual Acuity Review  Right Eye Distance:   Left Eye Distance:   Bilateral Distance:    Right Eye Near:   Left Eye Near:    Bilateral Near:         MDM   1. Tremor of both hands      Willeen Niece, MD 07/03/15 1440

## 2015-07-03 NOTE — ED Notes (Signed)
The patient presented to the [facility] with a complaint of dizziness and tremors in her hands. The patient stated that around 10am this date after she ate her breakfast her hands began to have tremors and she became dizzy. The patient stated that the tremors have stopped but she is still mildly dizzy.

## 2015-07-03 NOTE — Discharge Instructions (Signed)
Fue un [employer] Service fast streamer.  Creo que los temblores de las manos se deben al uso de medicamento para el catarro, o del proceso viral.  Ademas, el azucar estaba un poco bajo hoy.  Recomiendo que deje de tomar la medicina para el catarro.  Le doy una nota para el trabajo para hoy y [place].  Debe mantenerse bien hidratada y [place].

## 2015-07-05 ENCOUNTER — Ambulatory Visit: Payer: BLUE CROSS/BLUE SHIELD | Admitting: Obstetrics & Gynecology

## 2015-07-09 ENCOUNTER — Ambulatory Visit (INDEPENDENT_AMBULATORY_CARE_PROVIDER_SITE_OTHER): Payer: BLUE CROSS/BLUE SHIELD | Admitting: Obstetrics & Gynecology

## 2015-07-09 ENCOUNTER — Encounter: Payer: Self-pay | Admitting: Obstetrics & Gynecology

## 2015-07-09 VITALS — BP 131/78 | HR 78 | Temp 98.7°F | Wt 159.5 lb

## 2015-07-09 DIAGNOSIS — Z9889 Other specified postprocedural states: Secondary | ICD-10-CM

## 2015-07-09 NOTE — Patient Instructions (Signed)
Histerectoma total laparoscpica, cuidados posteriores (Total Laparoscopic Hysterectomy, Care After) Siga estas instrucciones durante las prximas semanas. Estas indicaciones le proporcionan informacin acerca de cmo deber cuidarse despus del procedimiento. El mdico tambin podr darle instrucciones ms especficas. El tratamiento se ha planificado de acuerdo a las prcticas mdicas actuales, pero a veces se producen problemas. Comunquese con el mdico si tiene algn problema o tiene dudas despus del procedimiento. QU ESPERAR DESPUS DEL PROCEDIMIENTO  Dolor y hematomas en los sitios de incisin. Le darn analgsicos para Financial controller.  Podr tener sntomas de menopausia, como calor repentino, sudoraciones nocturnas e insomnio si le han extirpado los ovarios.  Podr sentir dolor de garganta por el tubo del respirador que le colocaron durante la [place]. INSTRUCCIONES PARA EL CUIDADO EN EL HOGAR  Utilice los medicamentos de venta libre o recetados para Glass blower/designer, el malestar o la fiebre, segn se lo indique el mdico.  No tome aspirina. Puede ocasionar hemorragias.  No conduzca mientras est tomando analgsicos.  Siga las indicaciones de su mdico con respecto a la dieta, la actividad fsica, levantar objetos, conducir y para las actividades en general.  Reanude su dieta habitual, segn las indicaciones y los permisos.  Descanse y duerma lo suficiente.  Nose haga duchas vaginales, no utilice tampones ni tenga relaciones sexuales durante al menos 6 semanas o hasta que el profesional la autorice.  Cambie los apsitos (vendajes) tal como le indic su mdico.  Controle su temperatura y notifique a su mdico si tiene fiebre.  Tome duchas en lugar de baos durante 2 a 3 semanas.  No beba alcohol hasta que el mdico la autorice.  Si est estreida, tome un laxante suave, si el mdico la [place]. Los alimentos que contienen salvado la ayudarn para el problema de la  estreimiento. Debe ingerir la cantidad de lquidos suficientes para Theatre manager la orina de tono claro o color amarillo plido.  Trate de que alguien la acompae en su casa durante 1 o 2semanas, para ayudarla con los [employer].  Cumpla con todas las visitas de control, segn le indique su mdico. SOLICITE ATENCIN MDICA SI:  Observa enrojecimiento, hinchazn o siente dolor cada vez ms intenso en los sitios de las incisiones.  Tiene pus en el sitio de la incisin.  Advierte un olor feo que proviene de la incisin.  La incisin se abre.  Se siente mareada o sufre un desmayo.  Siente dolor o tiene una hemorragia al [employer].  Tiene diarrea persistente.  Tiene nuseas o vmitos persistentes.  Tiene flujo vaginal anormal.  Tiene una erupcin cutnea.  Tiene alguna reaccin anormal o aparece una alergia por los medicamentos.  El dolor no se alivia con los medicamentos recetados. SOLICITE ATENCIN MDICA DE INMEDIATO SI:  Siente falta de aire o dolor en el pecho.  Siente dolor intenso abdominal que no se alivia con los [employer].  Presenta dolor o hinchazn en las piernas. ASEGRESE DE QUE:  Comprende estas instrucciones.  Controlar su afeccin.  Recibir ayuda de inmediato si no mejora o si empeora. Document Released: 07/23/2013 Document Revised: 10/07/2013 [facility] Patient Information 2015 [place]. This information is not intended to replace advice given to you by your health care provider. Make sure you discuss any questions you have with your health care provider.

## 2015-07-09 NOTE — Progress Notes (Signed)
Patient ID: Debbie [name], female   DOB: Oct 13, 1971, 44 y.o.   MRN: [id number] History:  44 y.o. [id number] here today for 7 week f/u for post op check after [place] with bilateral salpingectomy.  Pt denies problems including no bowel or bladder issues.  She was sexually active 2 days prev with no problems.  She reports that she thought she was to wait 6 weeks although her daughter told her it was 8 weeks.  She denies problems after intercourse.  She is off all pain meds and reports no vasomotor sx.  She is back at work with no problems.  The following portions of the patient's history were reviewed and updated as appropriate: allergies, current medications, past family history, past medical history, past social history, past surgical history and problem list.  Review of Systems:  Pertinent items are noted in HPI.  Objective:  Physical Exam Blood pressure 131/78, pulse 78, temperature 98.7 F (37.1 C), weight 159 lb 8 oz (72.349 kg). Gen: NAD Abd: Soft, nontender and nondistended; port sites well healed Pelvic: Normal appearing external genitalia; normal appearing vaginal mucosa; her cuff is well healed.  Normal discharge.  No adnexal masses or tenderness  Labs and Imaging 05/18/2015 Diagnosis Uterus and cervix, with right fallopian tube INACTIVE ENDOMETRIUM, NEGATIVE FOR HYPERPLASIA OR MALIGNANCY. LEIOMYOMA X5 HISTOLOGICALLY UNREMARKABLE CERVIX NO FALLOPIAN TUBE IDENTIFIED  Assessment & Plan:  7 week f/u post [place] with bilateral salpingectomy- doing well  Gradual return to full activity. F/u in 4 months or sooner prn

## 2015-10-20 ENCOUNTER — Other Ambulatory Visit: Payer: Self-pay

## 2015-10-20 DIAGNOSIS — Z1231 Encounter for screening mammogram for malignant neoplasm of breast: Secondary | ICD-10-CM

## 2015-12-01 ENCOUNTER — Encounter: Payer: BLUE CROSS/BLUE SHIELD | Admitting: Women's Health

## 2016-12-07 ENCOUNTER — Emergency Department (HOSPITAL_COMMUNITY)
Admission: EM | Admit: 2016-12-07 | Discharge: 2016-12-07 | Disposition: A | Discharge status: Home / Self Care | Payer: Worker's Compensation | Attending: Emergency Medicine | Admitting: Emergency Medicine

## 2016-12-07 ENCOUNTER — Encounter (HOSPITAL_COMMUNITY): Payer: Self-pay | Admitting: Emergency Medicine

## 2016-12-07 DIAGNOSIS — Y939 Activity, unspecified: Secondary | ICD-10-CM | POA: Diagnosis not present

## 2016-12-07 DIAGNOSIS — Y9289 Other specified places as the place of occurrence of the external cause: Secondary | ICD-10-CM | POA: Insufficient documentation

## 2016-12-07 DIAGNOSIS — W273XXA Contact with needle (sewing), initial encounter: Secondary | ICD-10-CM

## 2016-12-07 DIAGNOSIS — Y990 Civilian activity done for income or pay: Secondary | ICD-10-CM | POA: Insufficient documentation

## 2016-12-07 DIAGNOSIS — S61431A Puncture wound without foreign body of right hand, initial encounter: Secondary | ICD-10-CM | POA: Insufficient documentation

## 2016-12-07 DIAGNOSIS — W461XXA Contact with contaminated hypodermic needle, initial encounter: Secondary | ICD-10-CM | POA: Diagnosis not present

## 2016-12-07 DIAGNOSIS — IMO0001 Reserved for inherently not codable concepts without codable children: Secondary | ICD-10-CM

## 2016-12-07 LAB — CBG MONITORING, ED: Glucose-Capillary: 97 mg/dL (ref 65–99)

## 2016-12-07 NOTE — ED Triage Notes (Signed)
Pt was eating a full meal when this RN came into triage this  patient. Pt is drinking a 24 oz non-diet soda. Pt stated that she found a pen on the floor in a walkway,that turned out to be an insulin syringe. Pt stated that the needle "poked" her han. Bled a few drops after puncture. .Insulin syringe intact with "100 units" at meniscus. Needle discarded by EMS

## 2016-12-07 NOTE — ED Triage Notes (Signed)
Pt stated that she is not a diabetic, has hypoglycemia

## 2016-12-07 NOTE — ED Triage Notes (Signed)
CBG 96. 

## 2016-12-07 NOTE — ED Triage Notes (Signed)
Per EMS-  [employer] (employer) called EMS with reported employee neddle stick. Pt stated that she found a pen-like object on the floor of the bathroom. Pt picked up "pen ", and a needle from the insulin syringe punctured thumb pad of r/hand. Pt stated that it bleed at the time. No bleeding from uncovered puncture site at present. Pt stated she is a diabetic, and needs to eat frequently

## 2016-12-07 NOTE — ED Provider Notes (Signed)
[facility] DEPT Provider Note   CSN: [id number] Arrival date & time: 12/07/16  1259  By signing my name below, I, Ethelle Lyon Long, attest that this documentation has been prepared under the direction and in the presence of Lameshia Hypolite Y. Stephine Langbehn, PA-C. Electronically Signed: Ethelle Lyon Long, Scribe. 12/07/2016. 1:40 PM.  History   Chief Complaint Chief Complaint  Patient presents with  . Body Fluid Exposure   The history is provided by the patient. No language interpreter was used.   HPI Comments:  Debbie [name] is a 46 y.o. female with a PMHx of Anemia and Hypoglycemia, who presents to the Emergency Department by way of ambulance complaining of sudden onset right hand pain, proximal palmar aspect s/p an accidental needle stick at work. 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 Bleeding is controlled at this time. She is not sure if any of the insulin was injected but reports she is hypoglycemic and has not eaten lunch yet today. Pt requests a work note for today. Pt denies fever and any other associated symptoms at this time.  Past Medical History:  Diagnosis Date  . Anemia   . Dysmenorrhea   . Headache(784.0)   . Hypoglycemia    possible per pt- becomes shaky and weak when hungry    Patient Active Problem List   Diagnosis Date Noted  . Post-operative state 05/18/2015  . Fibroid uterus 08/25/2013  . Anemia 03/13/2012  . DUB (dysfunctional uterine bleeding) 12/28/2011    Past Surgical History:  Procedure Laterality Date  . CESAREAN SECTION     X2  . ROBOTIC ASSISTED TOTAL HYSTERECTOMY WITH BILATERAL SALPINGO OOPHERECTOMY Right 05/18/2015   Procedure: ROBOTIC ASSISTED LAPAROSCOPIC  TOTAL HYSTERECTOMY WITH RIGHT SALPINGECTOMY, LYSIS OF ADHESIONS, CYSTOSCOPY;  Surgeon: Lavonia Drafts, MD;  Location: [facility];  Service: Gynecology;  Laterality: Right;    . TUBAL LIGATION      OB History    Gravida Para Term Preterm AB Living   4 4 4  0 0 4   SAB TAB Ectopic Multiple Live Births   0 0 0 0 4       Home Medications    Prior to Admission medications   Medication Sig Start Date End Date Taking? Authorizing Provider  Fe Fum-FePoly-FA-Vit C-Vit B3 (INTEGRA F) 125-1 MG CAPS Take 1 capsule by mouth daily. 03/24/15   Lavonia Drafts, MD    Family History Family History  Problem Relation Age of Onset  . Anesthesia problems Neg Hx   . Cancer Paternal Aunt 5    BREAST CANCER  . Cancer Cousin     [name]  . Cancer Paternal Grandfather     prostate    Social History Social History  Substance Use Topics  . Smoking status: Never Smoker  . Smokeless tobacco: Never Used  . Alcohol use No     Allergies   Penicillins   Review of Systems Review of Systems  Constitutional: Negative for fever.  Skin: Positive for wound.  All other systems reviewed and are negative.    Physical Exam Updated Vital Signs LMP  (LMP Unknown) Comment: pt on a pill to stop menses  Physical Exam  Constitutional: She is oriented to person, place, and time. She appears well-developed and well-nourished. No distress.  HENT:  Head: Normocephalic and atraumatic.  Eyes: Conjunctivae are normal.  Neck: Neck supple.  Neurological: She is alert  and oriented to person, place, and time.  Skin: Skin is warm and dry.  Tiny puncture wound to palm of right hand Nontender, no erythema  Psychiatric: She has a normal mood and affect. Judgment normal.  Nursing note and vitals reviewed.    ED Treatments / Results  DIAGNOSTIC STUDIES:  Oxygen Saturation is 100% on RA, normal by my interpretation.    COORDINATION OF CARE:  1:33 PM Discussed treatment plan with pt at bedside including blood work and pt agreed to plan. Pt advised of "no news is good news" in regards to future lab results following the needle stick.   Labs (all labs  ordered are listed, but only abnormal results are displayed) Labs Reviewed - No data to display  EKG  EKG Interpretation None       Radiology No results found.  Procedures Procedures (including critical care time)  Medications Ordered in ED Medications - No data to display   Initial Impression / Assessment and Plan / ED Course  I have reviewed the triage vital signs and the nursing notes.  Pertinent labs & imaging results that were available during my care of the patient were reviewed by me and considered in my medical decision making (see chart for details).    HIV and hep panel drawn and pending. Wound cleansed. Return precautions given.  Final Clinical Impressions(s) / ED Diagnoses   Final diagnoses:  Needlestick injury accident, initial encounter    New Prescriptions Discharge Medication List as of 12/07/2016  2:43 PM     I personally performed the services described in this documentation, which was scribed in my presence. The recorded information has been reviewed and is accurate.    Anne Ng, PA-C 12/07/16 1936    Duffy Bruce, MD 12/08/16 [phone]

## 2016-12-07 NOTE — Discharge Instructions (Signed)
We will contact you if any of your tests come back positive. Follow up with your primary care provider. Return to the ER for new or worsening symptoms.

## 2016-12-08 LAB — HEPATITIS PANEL, ACUTE
HCV Ab: <0.1 s/co ratio (ref 0.0–0.9)
HEP B S AG: NEGATIVE
Hep A IgM: NEGATIVE
Hep B C IgM: NEGATIVE

## 2016-12-08 LAB — HIV ANTIBODY (ROUTINE TESTING W REFLEX): HIV SCREEN 4TH GENERATION: NONREACTIVE

## 2017-02-28 ENCOUNTER — Encounter: Payer: Self-pay | Admitting: Gynecology

## 2018-03-04 ENCOUNTER — Encounter: Payer: Self-pay | Admitting: Obstetrics & Gynecology

## 2018-03-04 ENCOUNTER — Ambulatory Visit (INDEPENDENT_AMBULATORY_CARE_PROVIDER_SITE_OTHER): Payer: BLUE CROSS/BLUE SHIELD | Admitting: Obstetrics & Gynecology

## 2018-03-04 ENCOUNTER — Other Ambulatory Visit: Payer: Self-pay | Admitting: Obstetrics & Gynecology

## 2018-03-04 VITALS — BP 121/82 | HR 85 | Ht 61.0 in | Wt 165.0 lb

## 2018-03-04 DIAGNOSIS — Z01419 Encounter for gynecological examination (general) (routine) without abnormal findings: Secondary | ICD-10-CM

## 2018-03-04 DIAGNOSIS — Z1231 Encounter for screening mammogram for malignant neoplasm of breast: Secondary | ICD-10-CM | POA: Diagnosis not present

## 2018-03-04 NOTE — Progress Notes (Signed)
Subjective:     Debbie [name] is a 47 y.o. female here for a routine exam.  Current complaints: no problems. Pt reports that she has not been seen since her hyst due to lack of insurance.    Gynecologic History No LMP recorded (lmp unknown). Patient has had a hysterectomy. Contraception: status post hysterectomy Last Pap: 08/25/2013. Results were: normal Last mammogram: 2013. Results were: normal  Obstetric History OB History  Gravida Para Term Preterm AB Living  4 4 4  0 0 4  SAB TAB Ectopic Multiple Live Births  0 0 0 0 4    # Outcome Date GA Lbr Len/2nd Weight Sex Delivery Anes PTL Lv  4 Term     F CS-LTranv  N LIV  3 Term     M CS-LTranv  N LIV     Birth Comments: fetal heart beat went down  2 Term     F Vag-Spont  N LIV  1 Term     F Vag-Spont  N LIV   The following portions of the patient's history were reviewed and updated as appropriate: allergies, current medications, past family history, past medical history, past social history, past surgical history and problem list.  Review of Systems Pertinent items are noted in HPI.    Objective:  BP 121/82   Pulse 85   Ht 5\' 1"  (1.549 m)   Wt 165 lb (74.8 kg)   LMP  (LMP Unknown) Comment: pt on a pill to stop menses  BMI 31.18 kg/m   General Appearance:    Alert, cooperative, no distress, appears stated age  Head:    Normocephalic, without obvious abnormality, atraumatic  Eyes:    conjunctiva/corneas clear, EOM's intact, both eyes  Ears:    Normal external ear canals, both ears  Nose:   Nares normal, septum midline, mucosa normal, no drainage    or sinus tenderness  Throat:   Lips, mucosa, and tongue normal; teeth and gums normal  Neck:   Supple, symmetrical, trachea midline, no adenopathy;    thyroid:  no enlargement/tenderness/nodules  Back:     Symmetric, no curvature, ROM normal, no CVA tenderness  Lungs:     Clear to auscultation bilaterally, respirations unlabored  Chest Wall:    No tenderness or deformity   Heart:    Regular rate and rhythm, S1 and S2 normal, no murmur, rub   or gallop  Breast Exam:    No tenderness, masses, or nipple abnormality  Abdomen:     Soft, non-tender, bowel sounds active all four quadrants,    no masses, no organomegaly  Genitalia:    Normal female without lesion, discharge or tenderness     Extremities:   Extremities normal, atraumatic, no cyanosis or edema  Pulses:   2+ and symmetric all extremities  Skin:   Skin color, texture, turgor normal, no rashes or lesions     Assessment:    Healthy female exam- exam WNL   Breast cancer screening  Plan:    Mammogram ordered. Follow up in: 1 year. f/u sooner prn  Sequita Wise L. Harraway-Smith, M.D., Cherlynn June

## 2018-04-02 ENCOUNTER — Ambulatory Visit
Admission: RE | Admit: 2018-04-02 | Discharge: 2018-04-02 | Disposition: A | Discharge status: Home / Self Care | Payer: BLUE CROSS/BLUE SHIELD | Source: Ambulatory Visit | Attending: Obstetrics & Gynecology | Admitting: Obstetrics & Gynecology

## 2018-04-02 DIAGNOSIS — Z1231 Encounter for screening mammogram for malignant neoplasm of breast: Secondary | ICD-10-CM

## 2018-12-26 ENCOUNTER — Other Ambulatory Visit: Payer: Self-pay

## 2018-12-26 ENCOUNTER — Ambulatory Visit (INDEPENDENT_AMBULATORY_CARE_PROVIDER_SITE_OTHER): Payer: BLUE CROSS/BLUE SHIELD | Admitting: Obstetrics and Gynecology

## 2018-12-26 VITALS — BP 138/81 | Wt 171.6 lb

## 2018-12-26 DIAGNOSIS — R1030 Lower abdominal pain, unspecified: Secondary | ICD-10-CM | POA: Diagnosis not present

## 2018-12-26 LAB — POCT URINALYSIS DIP (DEVICE)
Bilirubin Urine: NEGATIVE
GLUCOSE, UA: NEGATIVE mg/dL
KETONES UR: NEGATIVE mg/dL
NITRITE: NEGATIVE
PH: 5.5 (ref 5.0–8.0)
Protein, ur: NEGATIVE mg/dL
Specific Gravity, Urine: <=1.005 (ref 1.005–1.030)
Urobilinogen, UA: 0.2 mg/dL (ref 0.0–1.0)

## 2018-12-26 LAB — POCT PREGNANCY, URINE: PREG TEST UR: NEGATIVE

## 2018-12-26 MED ORDER — POLYETHYLENE GLYCOL 3350 17 G PO PACK: 17.0000 g | PACK | Freq: Every day | ORAL | 3 refills | Status: DC

## 2018-12-26 NOTE — Progress Notes (Signed)
Ultrasound scheduled for Thursday 01/02/19 @ 1000.  Pt advised to arrive at 0945 with a full bladder.  Pt verbalized understanding.

## 2018-12-26 NOTE — Progress Notes (Signed)
Obstetrics and Gynecology Visit Established Patient Evaluation  Appointment Date: 12/26/2018  Primary Care Provider: Patient, No Pcp Per  OBGYN Clinic: [facility]  Chief Complaint: lower abdominal pain  History of Present Illness:  Debbie [name] is a 48 y.o. P4 with h/o c-section x 2 and RATLH and bilateral salpingectomy for fibroids, AUB in 2016.    For past 3 months she's had lower abdominal discomfort that radiates to the back and feels like menstrual cramps. No prior h/o similar pains and it happens for a few minutes and then goes away. Last episode was yesterday morning. Hard to say how frequently it comes on but it sounds like a few times a weeks and doesn't last very long. Seems to be worse with strenous movement and helped with ibuprofen  No vaginal discharge, bleeding, itching, dyspareunia, dysuria. +constipation (long standing) and goes about once q1-3d  Review of Systems: as noted in the History of Present Illness. Medications:  Debbie [name] had no medications administered during this visit. Current Outpatient Medications  Medication Sig Dispense Refill  . calcium-vitamin D 250-100 MG-UNIT tablet Take 1 tablet by mouth 2 (two) times daily.    . Fe Fum-FePoly-FA-Vit C-Vit B3 (INTEGRA F) 125-1 MG CAPS Take 1 capsule by mouth daily. 30 capsule 3   No current facility-administered medications for this visit.     Allergies: is allergic to penicillins.  Physical Exam:  BP 138/81   Wt 171 lb 9.6 oz (77.8 kg)   LMP  None  BMI 32.42 kg/m  Body mass index is 32.42 kg/m. General appearance: Well nourished, well developed female in no acute distress.  Abdomen: diffusely non tender to palpation, non distended, and no masses, hernias Neuro/Psych:  Normal mood and affect.    Pelvic exam:  EGBUS, vaginal vault: within normal limits Cuff: normal, intact, nttp  Assessment: Debbie [name] stable  Plan:  1. Lower abdominal pain Will get pelvic u/s. I d/w her  that may want to do 3-53m trial of depo provera or some type of hormone to see if that helps with her menstrual cycle since she is still cycling b/c she has her ovaries. Will f/u with her after u/s to go over next steps - Urine Culture - US PELVIC COMPLETE WITH TRANSVAGINAL; Future   RTC: PRN.   Durene Romans MD Attending Center for [employer] Fish farm manager)

## 2018-12-27 LAB — URINE CULTURE

## 2019-01-02 ENCOUNTER — Ambulatory Visit (HOSPITAL_COMMUNITY): Payer: BLUE CROSS/BLUE SHIELD

## 2019-03-06 ENCOUNTER — Ambulatory Visit (HOSPITAL_COMMUNITY)
Admission: RE | Admit: 2019-03-06 | Discharge: 2019-03-06 | Disposition: A | Discharge status: Home / Self Care | Payer: BLUE CROSS/BLUE SHIELD | Source: Ambulatory Visit | Attending: Obstetrics and Gynecology | Admitting: Obstetrics and Gynecology

## 2019-03-06 ENCOUNTER — Other Ambulatory Visit: Payer: Self-pay

## 2019-03-06 DIAGNOSIS — R1030 Lower abdominal pain, unspecified: Secondary | ICD-10-CM

## 2019-03-11 ENCOUNTER — Other Ambulatory Visit: Payer: Self-pay | Admitting: Obstetrics and Gynecology

## 2019-03-11 DIAGNOSIS — R1900 Intra-abdominal and pelvic swelling, mass and lump, unspecified site: Secondary | ICD-10-CM

## 2019-03-11 DIAGNOSIS — R1907 Generalized intra-abdominal and pelvic swelling, mass and lump: Secondary | ICD-10-CM

## 2019-03-19 ENCOUNTER — Ambulatory Visit (HOSPITAL_COMMUNITY): Payer: BLUE CROSS/BLUE SHIELD

## 2019-03-25 ENCOUNTER — Encounter: Payer: Self-pay | Admitting: *Deleted

## 2019-03-26 ENCOUNTER — Encounter: Payer: Self-pay | Admitting: *Deleted

## 2019-03-27 ENCOUNTER — Telehealth: Payer: Self-pay | Admitting: Obstetrics & Gynecology

## 2019-03-27 NOTE — Telephone Encounter (Signed)
Spoke with patient about getting her MyChart visit with Dr Ihor Dow per Dr Ilda Basset request. Patient was instructed on the virtual/My Chart visit. Patient now has MyChart app.

## 2019-04-02 ENCOUNTER — Telehealth: Payer: Self-pay | Admitting: Medical

## 2019-04-02 NOTE — Telephone Encounter (Signed)
The patient notes are charted, informed of upcoming appointment. No call made.

## 2019-04-04 ENCOUNTER — Telehealth (INDEPENDENT_AMBULATORY_CARE_PROVIDER_SITE_OTHER): Payer: BLUE CROSS/BLUE SHIELD | Admitting: Obstetrics & Gynecology

## 2019-04-04 ENCOUNTER — Telehealth: Payer: Self-pay | Admitting: General Practice

## 2019-04-04 ENCOUNTER — Other Ambulatory Visit: Payer: Self-pay

## 2019-04-04 ENCOUNTER — Encounter: Payer: Self-pay | Admitting: Obstetrics & Gynecology

## 2019-04-04 DIAGNOSIS — N9489 Other specified conditions associated with female genital organs and menstrual cycle: Secondary | ICD-10-CM

## 2019-04-04 DIAGNOSIS — R102 Pelvic and perineal pain: Secondary | ICD-10-CM

## 2019-04-04 DIAGNOSIS — Z1239 Encounter for other screening for malignant neoplasm of breast: Secondary | ICD-10-CM

## 2019-04-04 NOTE — Telephone Encounter (Signed)
Scheduled CT scan 8/14 @ 910 at [place] informed patient. Patient verbalized understanding & had no questions.

## 2019-04-04 NOTE — Progress Notes (Signed)
I connected with  Debbie [name] on 04/04/19 at 10:35 AM EDT by telephone and verified that I am speaking with the correct person using two identifiers.   I discussed the limitations, risks, security and privacy concerns of performing an evaluation and management service by telephone and the availability of in person appointments. I also discussed with the patient that there may be a patient responsible charge related to this service. The patient expressed understanding and agreed to proceed.  Derinda Late, RN 04/04/2019  10:32 AM

## 2019-04-04 NOTE — Progress Notes (Signed)
TELEHEALTH GYNECOLOGY VIRTUAL VIDEO VISIT ENCOUNTER NOTE  Provider location: [facility] for [facility] at [place] connected with [place] on 04/04/19 at 10:35 AM EDT by MyChart Video Encounter at home and verified that I am speaking with the correct person using two identifiers.   I discussed the limitations, risks, security and privacy concerns of performing an evaluation and management service by telephone and the availability of in person appointments. I also discussed with the patient that there may be a patient responsible charge related to this service. The patient expressed understanding and agreed to proceed.   History:  Debbie [name] is a 48 y.o. [phone] female being evaluated today for eval of pelvic pain x 6 months. Pt had adnexal mass noted on [place] and CT. She reports that she occ takes Tylenol for the pain. She had a RATH in 2016. She reports that the pain began as a mild pain but, has become more intense.She is able to complete ADLs. She had and [place] followed by a CT at [facility] because they were in Network.  She denies any abnormal vaginal discharge, bleeding, or other concerns.  She denies weight loss or other constitutional sx.      Past Medical History:  Diagnosis Date   Anemia    Dysmenorrhea    Headache(784.0)    Hypoglycemia    possible per pt- becomes shaky and weak when hungry   Past Surgical History:  Procedure Laterality Date   CESAREAN SECTION     X2   ROBOTIC ASSISTED TOTAL HYSTERECTOMY WITH BILATERAL SALPINGO OOPHERECTOMY Right 05/18/2015   Procedure: ROBOTIC ASSISTED LAPAROSCOPIC  TOTAL HYSTERECTOMY WITH RIGHT SALPINGECTOMY, LYSIS OF ADHESIONS, CYSTOSCOPY;  Surgeon: Lavonia Drafts, MD;  Location: [facility];  Service: Gynecology;  Laterality: Right;   TUBAL LIGATION     The following portions of the patient's history were reviewed and updated as appropriate: allergies, current medications, past family history, past medical history,  past social history, past surgical history and problem list.   Health Maintenance:  PAP: s/p hyst Normal mammogram on 04/02/2018.   Review of Systems:  Pertinent items noted in HPI and remainder of comprehensive ROS otherwise negative.  Physical Exam:   General:  Alert, oriented and cooperative. Patient appears to be in no acute distress.  Mental Status: Normal mood and affect. Normal behavior. Normal judgment and thought content.   Respiratory: Normal respiratory effort, no problems with respiration noted  Rest of physical exam deferred due to type of encounter  Labs and Imaging No results found for this or any previous visit (from the past 336 hour(s)). US Pelvic Complete With Transvaginal  Result Date: 03/06/2019 CLINICAL DATA:  48 year old female with a history of total hysterectomy and right salpingectomy 05/18/2015, now presenting with right lower abdominal pain. EXAM: TRANSABDOMINAL AND TRANSVAGINAL ULTRASOUND OF PELVIS TECHNIQUE: Both transabdominal and transvaginal ultrasound examinations of the pelvis were performed. Transabdominal technique was performed for global imaging of the pelvis including vaginal cuff, ovaries, adnexal regions, and pelvic cul-de-sac. It was necessary to proceed with endovaginal exam following the transabdominal exam to visualize the vaginal cuff and adnexa. COMPARISON:  03/30/2015 fell Vic sonogram. FINDINGS: Uterus Status post hysterectomy. No mass or fluid collection at the vaginal cuff. Right ovary Measurements: 3.3 x 1.7 x 2.3 cm (transabdominal measurements) = volume: 6.7 mL. Normal appearance/no adnexal mass. Left ovary Measurements: 3.8 x 1.8 x 2.5 cm (transabdominal measurements) = volume: 8.8 mL. Normal appearance/no adnexal mass. Other findings Trace simple free  fluid in the pelvic cul-de-sac. Incidentally noted hypoechoic 3.8 x 2.0 x 3.4 cm masslike focus with irregular outer contour located in the ventral abdomen just to the right of midline near the  umbilicus with internal vascularity on color and spectral Doppler. IMPRESSION: 1. Normal ovaries.  No adnexal masses. 2. No abnormal findings at the vaginal cuff status posthysterectomy. Nonspecific trace simple free fluid in the pelvic cul-de-sac. 3. Indeterminate incidental hypoechoic 3.8 x 2.0 x 3.4 cm irregular masslike focus in the ventral abdomen just to the right of midline near the umbilicus, with internal vascularity on Doppler. Per the ultrasound technologist, this mass is not in close proximity to the ovaries. Further evaluation is recommended with CT abdomen/pelvis with oral and IV contrast. Neoplastic etiology not excluded. Electronically Signed   By: Ilona Sorrel M.D.   On: 03/06/2019 11:42      03/21/2019  1. There is an oblong 4.7 cm intraperitoneal soft tissue lesion just underlying the midline lower abdominal wall. No history of prior surgical procedure is available. Diagnostic consideration may include scar or desmoid. Consider short-term follow-up in 9-12 weeks or US guided biopsy for tissue diagnosis. 2. No acute CT findings in the abdomen or pelvis.  Result Narrative  CT ABDOMEN PELVIS W CONTRAST (ROUTINE), 03/21/2019 4:08 PM  INDICATION: \ R19.07 Generalized intra-abdominal and pelvic swelling, mass and lump  COMPARISON: None.  TECHNIQUE: Multislice axial images were obtained through the abdomen and pelvis with administration of iodinated intravenous contrast material. Multi-planar reformatted images were generated for additional analysis. Nongated technique limits cardiac detail.   All CT scans at [facility] and [place] are performed using dose optimization techniques as appropriate to a performed exam, including but not limited to one or more of the following: automated exposure control, adjustment of the mA and/or kV according to patient size, use of iterative reconstruction technique. In addition, [facility] is participating in the [place] program which will further assist [place] in optimizing patient radiation exposure.   FINDINGS:   LOWER CHEST: . Mediastinum: Visualized lower mediastinum is within normal limits.  . Heart/vessel: Normal heart size. No pericardial effusion.  . Lungs: Visualized lung bases are clear. . Pleura: No effusions.   ABDOMEN: . Liver: Within normal limits. . Gallbladder/biliary: Within normal limits. . Spleen: Within normal limits. Small splenule inferiorly. . Pancreas: Within normal limits. . Adrenals: Within normal limits. . Kidneys: Within normal limits. . Peritoneum/mesentery: No ascites. No pneumoperitoneum. Oblong intraperitoneal soft tissue lesion measuring 4.7 x 1.7 cm underlying the right paramedian lower abdominal wall (series 2, image 144). Marland Kitchen Extraperitoneum: No adenopathy.  . GI tract: Bowel is normal in caliber without obstruction. Normal appendix. Moderate stool burden. No pericolonic inflammatory changes. . Vascular: Aorta is nonaneurysmal.  PELVIS: . Ureters: Within normal limits. . Bladder: Within normal limits. . Reproductive system: Hysterectomy. No adnexal mass.  MSK: . No acute abnormalities. Benign small sclerotic lesion in the left sacral wing.  Other Result Information  Interface, Rad Results In - 03/21/2019  4:22 PM EDT CT ABDOMEN PELVIS W CONTRAST (ROUTINE), 03/21/2019 4:08 PM  INDICATION: \ R19.07 Generalized intra-abdominal and pelvic swelling, mass and lump  COMPARISON: None.  TECHNIQUE: Multislice axial images were obtained through the abdomen and pelvis with administration of iodinated intravenous contrast material. Multi-planar reformatted images were generated for additional analysis. Nongated technique limits cardiac detail.   All CT scans at [facility] and [place] are performed using  dose optimization techniques as appropriate to a performed exam, including but not limited to one or more  of the following: automated exposure control, adjustment of the mA and/or kV according to patient size, use of iterative reconstruction technique. In addition, [facility] is participating in the [place] program which will further assist [place] in optimizing patient radiation exposure.   FINDINGS:   LOWER CHEST: .  Mediastinum: Visualized lower mediastinum is within normal limits.  .  Heart/vessel: Normal heart size. No pericardial effusion.  .  Lungs: Visualized lung bases are clear. .  Pleura: No effusions.   ABDOMEN: .  Liver: Within normal limits. .  Gallbladder/biliary: Within normal limits. .  Spleen: Within normal limits. Small splenule inferiorly. .  Pancreas: Within normal limits. .  Adrenals: Within normal limits. .  Kidneys: Within normal limits. .  Peritoneum/mesentery: No ascites. No pneumoperitoneum. Oblong intraperitoneal soft tissue lesion measuring 4.7 x 1.7 cm underlying the right paramedian lower abdominal wall (series 2, image 144). Marland Kitchen  Extraperitoneum: No adenopathy.  .  GI tract: Bowel is normal in caliber without obstruction. Normal appendix. Moderate stool burden. No pericolonic inflammatory changes. .  Vascular: Aorta is nonaneurysmal.  PELVIS: .  Ureters: Within normal limits. .  Bladder: Within normal limits. .  Reproductive system: Hysterectomy. No adnexal mass.  MSK: .  No acute abnormalities. Benign small sclerotic lesion in the left sacral wing.  CONCLUSION:  1.  There is an oblong 4.7 cm intraperitoneal soft tissue lesion just underlying the midline lower abdominal wall. No history of prior surgical procedure is available. Diagnostic consideration may include scar or desmoid. Consider short-term follow-up in 9-12 weeks or US guided biopsy for tissue diagnosis. 2.  No acute CT findings in the abdomen or pelvis.    Assessment and Plan:     Pelvic pain  Motrin 400-600mg  q 4-6 hours prn pain  F/u CT in 8 weeks  F/u with me in 9 weeks after CT.    Breast cancer screen  Needs screening mammogram    Pt speaks limited English her daughter was present virtually to assist with translation.   I discussed the assessment and treatment plan with the patient. The patient was provided an opportunity to ask questions and all were answered. The patient agreed with the plan and demonstrated an understanding of the instructions.   The patient was advised to call back or seek an in-person evaluation/go to the ED if the symptoms worsen or if the condition fails to improve as anticipated.  I provided 18 minutes of face-to-face time during this encounter.   Lavonia Drafts, MD [facility] for [facility], [place]

## 2019-05-29 ENCOUNTER — Ambulatory Visit: Payer: BLUE CROSS/BLUE SHIELD

## 2019-05-30 ENCOUNTER — Other Ambulatory Visit: Payer: Self-pay

## 2019-05-30 NOTE — Progress Notes (Signed)
Opened in Error.

## 2019-06-12 ENCOUNTER — Encounter: Payer: Self-pay | Admitting: *Deleted

## 2019-07-02 ENCOUNTER — Telehealth: Payer: Self-pay | Admitting: Obstetrics & Gynecology

## 2019-07-02 NOTE — Telephone Encounter (Signed)
Called the patient to inform of the upcoming visit. Received a fast busy signal.

## 2019-07-03 ENCOUNTER — Telehealth (INDEPENDENT_AMBULATORY_CARE_PROVIDER_SITE_OTHER): Payer: BLUE CROSS/BLUE SHIELD | Admitting: Obstetrics & Gynecology

## 2019-07-03 ENCOUNTER — Encounter: Payer: Self-pay | Admitting: Obstetrics & Gynecology

## 2019-07-03 ENCOUNTER — Other Ambulatory Visit: Payer: Self-pay

## 2019-07-03 DIAGNOSIS — R102 Pelvic and perineal pain: Secondary | ICD-10-CM | POA: Diagnosis not present

## 2019-07-03 DIAGNOSIS — R1900 Intra-abdominal and pelvic swelling, mass and lump, unspecified site: Secondary | ICD-10-CM

## 2019-07-03 NOTE — Progress Notes (Signed)
    TELEHEALTH GYNECOLOGY VIRTUAL VIDEO VISIT ENCOUNTER NOTE  Provider location: [facility] for [facility] at [place] connected with [place] on 07/03/19 at  9:55 AM EDT by MyChart Video Encounter at home and verified that I am speaking with the correct person using two identifiers.   I discussed the limitations, risks, security and privacy concerns of performing an evaluation and management service virtually and the availability of in person appointments. I also discussed with the patient that there may be a patient responsible charge related to this service. The patient expressed understanding and agreed to proceed.   History:  Debbie [name] is a 48 y.o. [phone] female being evaluated today for pelvic pain. She reports that her pain is improved and only noted occ with strenuous activity. She denies any abnormal vaginal discharge, bleeding, pelvic pain or other concerns.  She is s/p hyst.      Past Medical History:  Diagnosis Date  . Anemia   . Dysmenorrhea   . Headache(784.0)   . Hypoglycemia    possible per pt- becomes shaky and weak when hungry   Past Surgical History:  Procedure Laterality Date  . CESAREAN SECTION     X2  . ROBOTIC ASSISTED TOTAL HYSTERECTOMY WITH BILATERAL SALPINGO OOPHERECTOMY Right 05/18/2015   Procedure: ROBOTIC ASSISTED LAPAROSCOPIC  TOTAL HYSTERECTOMY WITH RIGHT SALPINGECTOMY, LYSIS OF ADHESIONS, CYSTOSCOPY;  Surgeon: Lavonia Drafts, MD;  Location: [facility];  Service: Gynecology;  Laterality: Right;  . TUBAL LIGATION     The following portions of the patient's history were reviewed and updated as appropriate: allergies, current medications, past family history, past medical history, past social history, past surgical history and problem list.    Review of Systems:  Pertinent items noted in HPI and remainder of comprehensive ROS otherwise negative.  Physical Exam:   General:  Alert, oriented and cooperative. Patient appears to  be in no acute distress.  Mental Status: Normal mood and affect. Normal behavior. Normal judgment and thought content.   Respiratory: Normal respiratory effort, no problems with respiration noted  Rest of physical exam deferred due to type of encounter  Labs and Imaging CT reports from [facility] under media tab.    Assessment and Plan:     Soft tissue mass in pelvis of uncertain etiology. Pt with minimal pain at present. Will consult GYN ONC and Gen surgery for best approach to evaluation.        I discussed the assessment and treatment plan with the patient. The patient was provided an opportunity to ask questions and all were answered. The patient agreed with the plan and demonstrated an understanding of the instructions.   The patient was advised to call back or seek an in-person evaluation/go to the ED if the symptoms worsen or if the condition fails to improve as anticipated.  I provided 12 minutes of face-to-face time during this encounter.   Lavonia Drafts, MD Center for [facility], [place]

## 2019-07-03 NOTE — Progress Notes (Signed)
I connected with  Debbie [name] on 07/03/19 at  9:55 AM EDT by telephone and verified that I am speaking with the correct person using two identifiers.   I discussed the limitations, risks, security and privacy concerns of performing an evaluation and management service by telephone and the availability of in person appointments. I also discussed with the patient that there may be a patient responsible charge related to this service. The patient expressed understanding and agreed to proceed.  Verdell Carmine, RN 07/03/2019  9:23 AM

## 2019-07-31 ENCOUNTER — Telehealth: Payer: Self-pay

## 2019-07-31 DIAGNOSIS — R1900 Intra-abdominal and pelvic swelling, mass and lump, unspecified site: Secondary | ICD-10-CM

## 2019-07-31 DIAGNOSIS — R102 Pelvic and perineal pain: Secondary | ICD-10-CM

## 2019-07-31 NOTE — Telephone Encounter (Signed)
[facility] @ [phone] and was able to receive appt October 22nd @ 1400 with Elaina Hoops.  [place] office notified to fax requested records for referral.  Pt notified of appt and appt information sent via [employer].  Dr. Maylene RoesTamala Julian notified as well.    Mel Almond, RN

## 2019-10-20 ENCOUNTER — Ambulatory Visit: Payer: BC Managed Care – PPO | Attending: Internal Medicine

## 2019-10-20 DIAGNOSIS — Z20822 Contact with and (suspected) exposure to covid-19: Secondary | ICD-10-CM

## 2019-10-20 DIAGNOSIS — U071 COVID-19: Secondary | ICD-10-CM | POA: Insufficient documentation

## 2019-10-22 LAB — NOVEL CORONAVIRUS, NAA: SARS-CoV-2, NAA: DETECTED — AB

## 2019-11-06 ENCOUNTER — Ambulatory Visit: Payer: Self-pay | Attending: Internal Medicine

## 2019-11-06 DIAGNOSIS — Z20822 Contact with and (suspected) exposure to covid-19: Secondary | ICD-10-CM

## 2019-11-07 LAB — NOVEL CORONAVIRUS, NAA: SARS-CoV-2, NAA: NOT DETECTED

## 2020-01-21 ENCOUNTER — Encounter (HOSPITAL_COMMUNITY): Payer: Self-pay | Admitting: Emergency Medicine

## 2020-01-21 ENCOUNTER — Emergency Department (HOSPITAL_COMMUNITY): Payer: BC Managed Care – PPO

## 2020-01-21 ENCOUNTER — Other Ambulatory Visit: Payer: Self-pay

## 2020-01-21 ENCOUNTER — Emergency Department (HOSPITAL_COMMUNITY)
Admission: EM | Admit: 2020-01-21 | Discharge: 2020-01-21 | Disposition: A | Discharge status: Home / Self Care | Payer: BC Managed Care – PPO | Attending: Emergency Medicine | Admitting: Emergency Medicine

## 2020-01-21 DIAGNOSIS — R0609 Other forms of dyspnea: Secondary | ICD-10-CM | POA: Insufficient documentation

## 2020-01-21 DIAGNOSIS — Z20822 Contact with and (suspected) exposure to covid-19: Secondary | ICD-10-CM | POA: Insufficient documentation

## 2020-01-21 DIAGNOSIS — R0789 Other chest pain: Secondary | ICD-10-CM | POA: Diagnosis not present

## 2020-01-21 LAB — CBC
HCT: 43 % (ref 36.0–46.0)
Hemoglobin: 13.5 g/dL (ref 12.0–15.0)
MCH: 27.4 pg (ref 26.0–34.0)
MCHC: 31.4 g/dL (ref 30.0–36.0)
MCV: 87.4 fL (ref 80.0–100.0)
Platelets: 364 10*3/uL (ref 150–400)
RBC: 4.92 MIL/uL (ref 3.87–5.11)
RDW: 13.8 % (ref 11.5–15.5)
WBC: 6.9 10*3/uL (ref 4.0–10.5)
nRBC: 0 % (ref 0.0–0.2)

## 2020-01-21 LAB — BASIC METABOLIC PANEL
Anion gap: 8 (ref 5–15)
BUN: 9 mg/dL (ref 6–20)
CO2: 27 mmol/L (ref 22–32)
Calcium: 9.3 mg/dL (ref 8.9–10.3)
Chloride: 103 mmol/L (ref 98–111)
Creatinine, Ser: 0.52 mg/dL (ref 0.44–1.00)
GFR calc Af Amer: >60 mL/min (ref >60)
GFR calc non Af Amer: >60 mL/min (ref >60)
Glucose, Bld: 103 mg/dL — ABNORMAL HIGH (ref 70–99)
Potassium: 3.7 mmol/L (ref 3.5–5.1)
Sodium: 138 mmol/L (ref 135–145)

## 2020-01-21 LAB — I-STAT BETA HCG BLOOD, ED (MC, WL, AP ONLY): I-stat hCG, quantitative: <5 m[IU]/mL (ref <5)

## 2020-01-21 LAB — TROPONIN I (HIGH SENSITIVITY)
Troponin I (High Sensitivity): <2 ng/L (ref <18)
Troponin I (High Sensitivity): <2 ng/L (ref <18)

## 2020-01-21 LAB — POC SARS CORONAVIRUS 2 AG -  ED: SARS Coronavirus 2 Ag: NEGATIVE

## 2020-01-21 LAB — D-DIMER, QUANTITATIVE: D-Dimer, Quant: 0.38 ug/mL-FEU (ref 0.00–0.50)

## 2020-01-21 MED ORDER — ESCITALOPRAM OXALATE 5 MG PO TABS: 5.0000 mg | ORAL_TABLET | Freq: Every day | ORAL | 1 refills | Status: DC

## 2020-01-21 MED ORDER — NAPROXEN 250 MG PO TABS
500.0000 mg | ORAL_TABLET | Freq: Once | ORAL | Status: AC
  Administered 2020-01-21: 500 mg via ORAL
  Filled 2020-01-21: qty 2

## 2020-01-21 MED ORDER — ESCITALOPRAM OXALATE 10 MG PO TABS
5.0000 mg | ORAL_TABLET | Freq: Once | ORAL | Status: AC
  Administered 2020-01-21: 5 mg via ORAL
  Filled 2020-01-21: qty 1

## 2020-01-21 MED ORDER — NAPROXEN 500 MG PO TABS: 500.0000 mg | ORAL_TABLET | Freq: Two times a day (BID) | ORAL | 0 refills | Status: AC

## 2020-01-21 NOTE — Discharge Instructions (Signed)
As discussed, your evaluation today has been largely reassuring.  But, it is important that you monitor your condition carefully, and do not hesitate to return to the ED if you develop new, or concerning changes in your condition.  Otherwise, it is very important that you follow-up with a primary care physician to ensure appropriate improvement in your condition and appropriate affect of the new medication regimen you are starting.

## 2020-01-21 NOTE — ED Notes (Signed)
Pt ambulated to the RR without incident. No distress.

## 2020-01-21 NOTE — ED Triage Notes (Signed)
Pt endorses SOB for 3 days and central CP

## 2020-01-21 NOTE — ED Notes (Signed)
Patient verbalizes understanding of discharge instructions. Opportunity for questioning and answers were provided. Armband removed by staff, pt discharged from ED.  

## 2020-01-21 NOTE — ED Provider Notes (Signed)
[place] EMERGENCY DEPARTMENT Provider Note   CSN: [id number] Arrival date & time: 01/21/20  1025     History Chief Complaint  Patient presents with  . Shortness of Breath    Debbie [name] is a 49 y.o. female.  HPI   Patient presents with concern of chest pain, dyspnea.  She notes no medical problems, takes no medication regularly.  She received her second dose of the coronavirus vaccine 5 days ago.  She now notes that over the past 3 days she has had tightness in her chest, difficulty with breathing.  There is some associated sense of palpitations, but this is less consistent, seemingly. No fever, no vomiting.  Today she had some loose stool, though it does not sound associated with persistent diarrhea. She does not smoke, he is generally well, is from [place] originally.  Past Medical History:  Diagnosis Date  . Anemia   . Dysmenorrhea   . Headache(784.0)   . Hypoglycemia    possible per pt- becomes shaky and weak when hungry    Patient Active Problem List   Diagnosis Date Noted  . Abdominal mass 03/11/2019  . Post-operative state 05/18/2015  . Fibroid uterus 08/25/2013  . Anemia 03/13/2012  . DUB (dysfunctional uterine bleeding) 12/28/2011    Past Surgical History:  Procedure Laterality Date  . CESAREAN SECTION     X2  . ROBOTIC ASSISTED TOTAL HYSTERECTOMY WITH BILATERAL SALPINGO OOPHERECTOMY Right 05/18/2015   Procedure: ROBOTIC ASSISTED LAPAROSCOPIC  TOTAL HYSTERECTOMY WITH RIGHT SALPINGECTOMY, LYSIS OF ADHESIONS, CYSTOSCOPY;  Surgeon: Lavonia Drafts, MD;  Location: [facility];  Service: Gynecology;  Laterality: Right;  . TUBAL LIGATION       OB History    Gravida  4   Para  4   Term  4   Preterm  0   AB  0   Living  4     SAB  0   TAB  0   Ectopic  0   Multiple  0   Live Births  4           Family History  Problem Relation Age of Onset  . Cancer Paternal Aunt 66       BREAST CANCER  . Cancer Cousin          [name]  . Cancer Paternal Grandfather        prostate  . Hypertension Mother   . Diabetes Mother   . Hypertension Father   . Diabetes Father   . Anesthesia problems Neg Hx     Social History   Tobacco Use  . Smoking status: Never Smoker  . Smokeless tobacco: Never Used  Substance Use Topics  . Alcohol use: Yes    Comment: occ  . Drug use: No    Home Medications Prior to Admission medications   Not on File    Allergies    Penicillins  Review of Systems   Review of Systems  Constitutional:       Per HPI, otherwise negative  HENT:       Per HPI, otherwise negative  Respiratory:       Per HPI, otherwise negative  Cardiovascular:       Per HPI, otherwise negative  Gastrointestinal: Negative for vomiting.  Endocrine:       Negative aside from HPI  Genitourinary:       Neg aside from HPI   Musculoskeletal:       Per HPI, otherwise  negative  Skin: Negative.   Neurological: Negative for syncope.    Physical Exam Updated Vital Signs BP 131/86   Pulse 66   Temp 98 F (36.7 C) (Oral)   Resp 14   Ht 5\' 1"  (1.549 m)   Wt 73.9 kg   LMP  (LMP Unknown) Comment: pt on a pill to stop menses  SpO2 100%   BMI 30.80 kg/m   Physical Exam Vitals and nursing note reviewed.  Constitutional:      General: She is not in acute distress.    Appearance: She is well-developed.  HENT:     Head: Normocephalic and atraumatic.  Eyes:     Conjunctiva/sclera: Conjunctivae normal.  Cardiovascular:     Rate and Rhythm: Normal rate and regular rhythm.  Pulmonary:     Effort: Pulmonary effort is normal. No respiratory distress.     Breath sounds: Normal breath sounds. No stridor.  Chest:     Comments: No chest wall tenderness, no deformity Abdominal:     General: There is no distension.  Skin:    General: Skin is warm and dry.  Neurological:     Mental Status: She is alert and oriented to person, place, and time.     Cranial Nerves: No cranial nerve  deficit.     ED Results / Procedures / Treatments   Labs (all labs ordered are listed, but only abnormal results are displayed) Labs Reviewed  BASIC METABOLIC PANEL - Abnormal; Notable for the following components:      Result Value   Glucose, Bld 103 (*)    All other components within normal limits  CBC  D-DIMER, QUANTITATIVE (NOT AT [facility])  I-STAT BETA HCG BLOOD, ED ([facility], [facility], [facility] ONLY)  POC SARS CORONAVIRUS 2 AG -  ED  TROPONIN I (HIGH SENSITIVITY)  TROPONIN I (HIGH SENSITIVITY)    EKG EKG Interpretation  Date/Time:  Wednesday January 21 2020 10:58:44 EDT Ventricular Rate:  82 PR Interval:  156 QRS Duration: 76 QT Interval:  354 QTC Calculation: 413 R Axis:   41 Text Interpretation: Normal sinus rhythm T wave abnormality Artifact Abnormal ECG Confirmed by Carmin Muskrat [phone]) on 01/21/2020 3:30:20 PM   Radiology DG Chest 2 View  Result Date: 01/21/2020 CLINICAL DATA:  Shortness of breath for 3 days, central chest pain EXAM: CHEST - 2 VIEW COMPARISON:  None FINDINGS: Enlargement of cardiac silhouette. Mediastinal contours and pulmonary vascularity normal. Lungs clear. No infiltrate, pleural effusion, or pneumothorax. Osseous structures unremarkable. IMPRESSION: No acute abnormalities. Electronically Signed   By: Lavonia Dana M.D.   On: 01/21/2020 11:35    Procedures Procedures (including critical care time)  Medications Ordered in ED Medications  escitalopram (LEXAPRO) tablet 5 mg (5 mg Oral Given 01/21/20 1751)  naproxen (NAPROSYN) tablet 500 mg (500 mg Oral Given 01/21/20 1751)    ED Course  I have reviewed the triage vital signs and the nursing notes.  Pertinent labs & imaging results that were available during my care of the patient were reviewed by me and considered in my medical decision making (see chart for details).  EMR review notable for positive coronavirus test 3 months ago. MDM Rules/Calculators/A&P                     Previously well female presents with  chest pain, dyspnea.  Patient is a non-smoker, given description of chest tightness, pain, broad differential including ACS, PE, pneumonia, bronchitis considered.  Appropriate labs, x-ray, EKG ordered after  the initial evaluation. On initial evaluation cardiac rhythm is 80, sinus on the monitor, unremarkable.  6:06 PM On review exam the patient is awake, alert, hemodynamically unremarkable, cardiac monitor similar sinus rhythm, rate 70s, unremarkable. She is now accompanied by her daughter who assists with history.  She notes that the patient has recently had substantial stress, including the death of 2 family members, and a recent diagnosis of an aunt with cancer. We discussed possibilities for stress, anxiety contributing to today's presentation.  Labs, EKG, x-ray all reviewed, interpreted, reassuring.  No evidence for ACS, PE, pneumonia.  Patient has had prior Covid infection, is now status post 2 separate doses of vaccine, and testing for that today is negative as well.  Some suspicion for strain versus musculoskeletal inflammation exacerbated by the patient's ongoing stress.  Patient requests meds for assistance with this.  This is reasonable, discussed with patient and her daughter, but absent primary care for follow-up I discussed her case with case management as well to ensure that she has a physician with whom she can follow-up discussed her new medication regimen and today's ED evaluation. Final Clinical Impression(s) / ED Diagnoses Final diagnoses:  Atypical chest pain    Rx / DC Orders ED Discharge Orders         Ordered    escitalopram (LEXAPRO) 5 MG tablet  Daily     01/21/20 1809    naproxen (NAPROSYN) 500 MG tablet  2 times daily with meals     01/21/20 1809           Carmin Muskrat, MD 01/21/20 [phone]

## 2020-02-09 ENCOUNTER — Encounter: Payer: Self-pay | Admitting: Registered Nurse

## 2020-02-09 ENCOUNTER — Ambulatory Visit (INDEPENDENT_AMBULATORY_CARE_PROVIDER_SITE_OTHER): Payer: BC Managed Care – PPO | Admitting: Registered Nurse

## 2020-02-09 ENCOUNTER — Other Ambulatory Visit: Payer: Self-pay

## 2020-02-09 VITALS — BP 138/84 | HR 85 | Temp 98.3°F | Resp 16 | Ht 61.0 in | Wt 162.0 lb

## 2020-02-09 DIAGNOSIS — Z7689 Persons encountering health services in other specified circumstances: Secondary | ICD-10-CM

## 2020-02-09 DIAGNOSIS — Z13220 Encounter for screening for lipoid disorders: Secondary | ICD-10-CM

## 2020-02-09 DIAGNOSIS — F411 Generalized anxiety disorder: Secondary | ICD-10-CM

## 2020-02-09 DIAGNOSIS — Z13228 Encounter for screening for other metabolic disorders: Secondary | ICD-10-CM

## 2020-02-09 DIAGNOSIS — Z1329 Encounter for screening for other suspected endocrine disorder: Secondary | ICD-10-CM | POA: Diagnosis not present

## 2020-02-09 DIAGNOSIS — R071 Chest pain on breathing: Secondary | ICD-10-CM | POA: Diagnosis not present

## 2020-02-09 DIAGNOSIS — Z130 Encounter for screening for diseases of the blood and blood-forming organs and certain disorders involving the immune mechanism: Secondary | ICD-10-CM

## 2020-02-09 MED ORDER — ESCITALOPRAM OXALATE 10 MG PO TABS: 10.0000 mg | ORAL_TABLET | Freq: Every day | ORAL | 0 refills | Status: DC

## 2020-02-09 MED ORDER — ALPRAZOLAM 0.25 MG PO TBDP: 0.2500 mg | ORAL_TABLET | Freq: Every evening | ORAL | 0 refills | Status: DC | PRN

## 2020-02-09 NOTE — Patient Instructions (Signed)
° ° ° °  If you have lab work done today you will be contacted with your lab results within the next 2 weeks.  If you have not heard from us then please contact us. The fastest way to get your results is to register for My Chart. ° ° °IF you received an x-ray today, you will receive an invoice from [facility]. Please contact [facility] at [phone] with questions or concerns regarding your invoice.  ° °IF you received labwork today, you will receive an invoice from [facility]. Please contact [facility] at [phone] with questions or concerns regarding your invoice.  ° °Our billing staff will not be able to assist you with questions regarding bills from these companies. ° °You will be contacted with the lab results as soon as they are available. The fastest way to get your results is to activate your My Chart account. Instructions are located on the last page of this paperwork. If you have not heard from us regarding the results in 2 weeks, please contact this office. °  ° ° ° °

## 2020-02-10 ENCOUNTER — Encounter: Payer: Self-pay | Admitting: Registered Nurse

## 2020-02-10 LAB — COMPREHENSIVE METABOLIC PANEL
ALT: 13 IU/L (ref 0–32)
AST: 18 IU/L (ref 0–40)
Albumin/Globulin Ratio: 1.6 (ref 1.2–2.2)
Albumin: 4.2 g/dL (ref 3.8–4.8)
Alkaline Phosphatase: 64 IU/L (ref 39–117)
BUN/Creatinine Ratio: 15 (ref 9–23)
BUN: 8 mg/dL (ref 6–24)
Bilirubin Total: 0.4 mg/dL (ref 0.0–1.2)
CO2: 25 mmol/L (ref 20–29)
Calcium: 9.2 mg/dL (ref 8.7–10.2)
Chloride: 104 mmol/L (ref 96–106)
Creatinine, Ser: 0.55 mg/dL — ABNORMAL LOW (ref 0.57–1.00)
GFR calc Af Amer: 127 mL/min/{1.73_m2} (ref >59)
GFR calc non Af Amer: 110 mL/min/{1.73_m2} (ref >59)
Globulin, Total: 2.6 g/dL (ref 1.5–4.5)
Glucose: 93 mg/dL (ref 65–99)
Potassium: 3.8 mmol/L (ref 3.5–5.2)
Sodium: 141 mmol/L (ref 134–144)
Total Protein: 6.8 g/dL (ref 6.0–8.5)

## 2020-02-10 LAB — CBC WITH DIFFERENTIAL
Basophils Absolute: 0.1 10*3/uL (ref 0.0–0.2)
Basos: 1 %
EOS (ABSOLUTE): 0.1 10*3/uL (ref 0.0–0.4)
Eos: 2 %
Hematocrit: 40.8 % (ref 34.0–46.6)
Hemoglobin: 13.4 g/dL (ref 11.1–15.9)
Immature Grans (Abs): 0 10*3/uL (ref 0.0–0.1)
Immature Granulocytes: 0 %
Lymphocytes Absolute: 2.1 10*3/uL (ref 0.7–3.1)
Lymphs: 31 %
MCH: 27.6 pg (ref 26.6–33.0)
MCHC: 32.8 g/dL (ref 31.5–35.7)
MCV: 84 fL (ref 79–97)
Monocytes Absolute: 0.6 10*3/uL (ref 0.1–0.9)
Monocytes: 9 %
Neutrophils Absolute: 4 10*3/uL (ref 1.4–7.0)
Neutrophils: 57 %
RBC: 4.86 x10E6/uL (ref 3.77–5.28)
RDW: 13.4 % (ref 11.7–15.4)
WBC: 6.9 10*3/uL (ref 3.4–10.8)

## 2020-02-10 LAB — LIPID PANEL
Chol/HDL Ratio: 5.2 ratio — ABNORMAL HIGH (ref 0.0–4.4)
Cholesterol, Total: 166 mg/dL (ref 100–199)
HDL: 32 mg/dL — ABNORMAL LOW (ref >39)
LDL Chol Calc (NIH): 117 mg/dL — ABNORMAL HIGH (ref 0–99)
Triglycerides: 93 mg/dL (ref 0–149)
VLDL Cholesterol Cal: 17 mg/dL (ref 5–40)

## 2020-02-10 LAB — TSH: TSH: 1.47 u[IU]/mL (ref 0.450–4.500)

## 2020-02-10 LAB — HEMOGLOBIN A1C
Est. average glucose Bld gHb Est-mCnc: 100 mg/dL
Hgb A1c MFr Bld: 5.1 % (ref 4.8–5.6)

## 2020-02-11 NOTE — Progress Notes (Signed)
New Patient Office Visit  Subjective:  Patient ID: Summar Colon, female    DOB: [birth year]  Age: 49 y.o. MRN: [id number]  CC:  Chief Complaint  Patient presents with  . New Patient (Initial Visit)    establish care. Also patient states she went to the hospital 2 weeks ago had shortness of breath, Rapid heart rate and some chest pain. Per patient she still having some of those issues and some added left shoulder pain.     HPI Terianna Colon presents for visit to establish care / hosp f/u  Notes that she went to ED with chest pain and trouble breathing. Work up was negative on exam, labs, and ekg. They suggested anxiety as a contributing factor - multiple family members have passed away recently, a lot of stress at home, even more at work. She is having ongoing symptoms today of chest tightness, trouble breathing, some radiation towards shoulder.   She reports that this has improved by taking a few days off from work and limiting her stress.  No headaches, visual changes, shob, doe, nvd, claudication, or dependent edema. No hx of adverse cardiovascular events.   Otherwise, feeling well, no further concerns.   Past Medical History:  Diagnosis Date  . Anemia   . Dysmenorrhea   . Headache(784.0)   . Hypoglycemia    possible per pt- becomes shaky and weak when hungry    Past Surgical History:  Procedure Laterality Date  . CESAREAN SECTION     X2  . ROBOTIC ASSISTED TOTAL HYSTERECTOMY WITH BILATERAL SALPINGO OOPHERECTOMY Right 05/18/2015   Procedure: ROBOTIC ASSISTED LAPAROSCOPIC  TOTAL HYSTERECTOMY WITH RIGHT SALPINGECTOMY, LYSIS OF ADHESIONS, CYSTOSCOPY;  Surgeon: Lavonia Drafts, MD;  Location: [facility];  Service: Gynecology;  Laterality: Right;  . TUBAL LIGATION      Family History  Problem Relation Age of Onset  . Cancer Paternal Aunt 96       BREAST CANCER  . Cancer Cousin        COLON.Marland Kitchen PATERNAL SIDE  . Cancer Paternal Grandfather        prostate  .  Hypertension Mother   . Diabetes Mother   . Hypertension Father   . Diabetes Father   . Anesthesia problems Neg Hx     Social History   Socioeconomic History  . Marital status: Married    Spouse name: Not on file  . Number of children: Not on file  . Years of education: Not on file  . Highest education level: Not on file  Occupational History  . Not on file  Tobacco Use  . Smoking status: Never Smoker  . Smokeless tobacco: Never Used  Substance and Sexual Activity  . Alcohol use: Yes    Comment: occ  . Drug use: No  . Sexual activity: Yes    Birth control/protection: Surgical    Comment: BTL  Other Topics Concern  . Not on file  Social History Narrative  . Not on file   Social Determinants of Health   Financial Resource Strain:   . Difficulty of Paying Living Expenses:   Food Insecurity:   . Worried About Charity fundraiser in the Last Year:   . Arboriculturist in the Last Year:   Transportation Needs:   . Film/video editor (Medical):   Marland Kitchen Lack of Transportation (Non-Medical):   Physical Activity:   . Days of Exercise per Week:   . Minutes of Exercise per Session:   Stress:   .  Feeling of Stress :   Social Connections:   . Frequency of Communication with Friends and Family:   . Frequency of Social Gatherings with Friends and Family:   . Attends Religious Services:   . Active Member of Clubs or Organizations:   . Attends Archivist Meetings:   Marland Kitchen Marital Status:   Intimate Partner Violence:   . Fear of Current or Ex-Partner:   . Emotionally Abused:   Marland Kitchen Physically Abused:   . Sexually Abused:     ROS Review of Systems  Constitutional: Negative.   HENT: Negative.   Eyes: Negative.   Respiratory: Positive for chest tightness and shortness of breath. Negative for apnea, cough, choking, wheezing and stridor.   Cardiovascular: Negative.  Negative for chest pain, palpitations and leg swelling.  Gastrointestinal: Negative.   Endocrine:  Negative.   Genitourinary: Negative.   Musculoskeletal: Negative.   Skin: Negative.   Allergic/Immunologic: Negative.   Neurological: Negative.   Hematological: Negative.   Psychiatric/Behavioral: Negative.   All other systems reviewed and are negative.   Objective:   Today's Vitals: BP 138/84   Pulse 85   Temp 98.3 F (36.8 C) (Temporal)   Resp 16   Ht 5\' 1"  (1.549 m)   Wt 162 lb (73.5 kg)   LMP  (LMP Unknown) Comment: pt on a pill to stop menses  SpO2 99%   BMI 30.61 kg/m   Physical Exam Vitals and nursing note reviewed.  Constitutional:      General: She is not in acute distress.    Appearance: Normal appearance. She is obese. She is not ill-appearing, toxic-appearing or diaphoretic.  Cardiovascular:     Rate and Rhythm: Normal rate and regular rhythm.     Pulses: Normal pulses.     Heart sounds: Normal heart sounds. No murmur. No friction rub. No gallop.   Pulmonary:     Effort: Pulmonary effort is normal. No respiratory distress.     Breath sounds: Normal breath sounds. No stridor. No wheezing, rhonchi or rales.  Chest:     Chest wall: No tenderness.  Skin:    General: Skin is warm and dry.     Capillary Refill: Capillary refill takes less than 2 seconds.     Coloration: Skin is not jaundiced or pale.     Findings: No bruising, erythema, lesion or rash.  Neurological:     General: No focal deficit present.     Mental Status: She is alert and oriented to person, place, and time. Mental status is at baseline.  Psychiatric:        Mood and Affect: Mood normal.        Behavior: Behavior normal.        Thought Content: Thought content normal.        Judgment: Judgment normal.     Assessment & Plan:   Problem List Items Addressed This Visit    None    Visit Diagnoses    Chest pain on breathing    -  Primary   Relevant Medications   ALPRAZolam (NIRAVAM) 0.25 MG dissolvable tablet   Other Relevant Orders   EKG 12-Lead (Completed)   Generalized anxiety  disorder       Relevant Medications   escitalopram (LEXAPRO) 10 MG tablet   ALPRAZolam (NIRAVAM) 0.25 MG dissolvable tablet   Screening for endocrine, metabolic and immunity disorder       Relevant Orders   CBC With Differential (Completed)   TSH (Completed)  Comprehensive metabolic panel (Completed)   Hemoglobin A1c (Completed)   Lipid screening       Relevant Orders   Lipid panel (Completed)      Outpatient Encounter Medications as of 02/09/2020  Medication Sig  . ALPRAZolam (NIRAVAM) 0.25 MG dissolvable tablet Take 1-2 tablets (0.25-0.5 mg total) by mouth at bedtime as needed for anxiety.  Marland Kitchen escitalopram (LEXAPRO) 10 MG tablet Take 1 tablet (10 mg total) by mouth daily.  Marland Kitchen escitalopram (LEXAPRO) 5 MG tablet Take 1 tablet (5 mg total) by mouth daily. (Patient not taking: Reported on 02/09/2020)   No facility-administered encounter medications on file as of 02/09/2020.    Follow-up: Return in about 4 months (around 06/10/2020) for 4-6 weeks escitalopram med check.   PLAN  Given history, unremarkable exam, and normal ekg - agree with ed assessment that this is likely related to anxiety. Pt agrees. Will start escitalopram 10mg  PO qd and med check in 4-6 weeks. Discussed r/b/se. Given alprazolam 0.25mg  sublingual po bid prn while we await escitalopram full effect  Labs collected, will follow up as warranted  Patient encouraged to call clinic with any questions, comments, or concerns.  Maximiano Coss, NP

## 2020-02-16 ENCOUNTER — Ambulatory Visit: Payer: BC Managed Care – PPO | Attending: Internal Medicine

## 2020-02-16 DIAGNOSIS — Z20822 Contact with and (suspected) exposure to covid-19: Secondary | ICD-10-CM

## 2020-02-17 LAB — NOVEL CORONAVIRUS, NAA: SARS-CoV-2, NAA: NOT DETECTED

## 2020-02-17 LAB — SARS-COV-2, NAA 2 DAY TAT

## 2020-02-19 ENCOUNTER — Ambulatory Visit: Payer: BC Managed Care – PPO

## 2020-02-19 ENCOUNTER — Ambulatory Visit (INDEPENDENT_AMBULATORY_CARE_PROVIDER_SITE_OTHER)
Admission: RE | Admit: 2020-02-19 | Discharge: 2020-02-19 | Disposition: A | Discharge status: Home / Self Care | Payer: BC Managed Care – PPO | Source: Ambulatory Visit

## 2020-02-19 DIAGNOSIS — R1084 Generalized abdominal pain: Secondary | ICD-10-CM

## 2020-02-19 DIAGNOSIS — K529 Noninfective gastroenteritis and colitis, unspecified: Secondary | ICD-10-CM

## 2020-02-19 MED ORDER — ONDANSETRON HCL 4 MG PO TABS: 4.0000 mg | ORAL_TABLET | Freq: Four times a day (QID) | ORAL | 0 refills | Status: DC | PRN

## 2020-02-19 NOTE — Discharge Instructions (Signed)
Take the antinausea medication as directed.    Keep yourself hydrated with clear liquids, such as water, Gatorade, Pedialyte, Sprite, or ginger ale.    Follow up with your primary care provider or come here to be seen in person if your symptoms are not improving.

## 2020-02-19 NOTE — ED Provider Notes (Signed)
Virtual Visit via Video Note:  Debbie [name]  initiated request for Telemedicine visit with [facility] team. I connected with Debbie [name]  on 02/19/2020 at 1:18 PM  for a synchronized telemedicine visit using a video enabled HIPPA compliant telemedicine application. I verified that I am speaking with Debbie [name]  using two identifiers. Sharion Balloon, NP  was physically located in a [facility] site and Debbie [name] was located at a different location.   The limitations of evaluation and management by telemedicine as well as the availability of in-person appointments were discussed. Patient was informed that she  may incur a bill ( including co-pay) for this virtual visit encounter. Debbie [name]  expressed understanding and gave verbal consent to proceed with virtual visit.     History of Present Illness:Debbie [name]  is a 49 y.o. female presents for evaluation of nausea, vomiting, and diarrhea since yesterday.  She had negative COVID on 02/16/2020.  She also reports a fever, chills, and mild generalized abdominal pain.  Tmax 101; currently 99.4 (taken while on video); no medications taken for symptoms.  One episode of vomiting and one episode of diarrhea today.  She denies rash, cough, shortness of breath, or other symptoms.  She denies current pregnancy or breastfeeding.     Allergies  Allergen Reactions  . Penicillins Other (See Comments)    Not sure if she has true allergy to Penicillin but her father told her that she or one of her sisters had a reaction.     Past Medical History:  Diagnosis Date  . Anemia   . Dysmenorrhea   . Headache(784.0)   . Hypoglycemia    possible per pt- becomes shaky and weak when hungry     Social History   Tobacco Use  . Smoking status: Never Smoker  . Smokeless tobacco: Never Used  Substance Use Topics  . Alcohol use: Yes    Comment: occ  . Drug use: No   ROS: as stated in HPI.  All other systems  reviewed and negative.      Observations/Objective: Physical Exam  VITALS: Current temp 99.4. GENERAL: Alert, appears well and in no acute distress. HEENT: Atraumatic. NECK: Normal movements of the head and neck. CARDIOPULMONARY: No increased WOB. Speaking in clear sentences. I:E ratio WNL.  MS: Moves all visible extremities without noticeable abnormality. PSYCH: Pleasant and cooperative, well-groomed. Speech normal rate and rhythm. Affect is appropriate. Insight and judgement are appropriate. Attention is focused, linear, and appropriate.  NEURO: CN grossly intact. Oriented as arrived to appointment on time with no prompting. Moves both UE equally.  SKIN: No obvious lesions, wounds, erythema, or cyanosis noted on face or hands.   Assessment and Plan:    ICD-10-CM   1. Gastroenteritis  K52.9   2. Generalized abdominal pain  R10.84        Follow Up Instructions: Note provided for work.  Treating n/v with Zofran.  Clear liquid diet; advance to BRAT as tolerated. Instructed patient to come for in person visit if her symptoms persist or worsen. Patient agrees to plan of care.    I discussed the assessment and treatment plan with the patient. The patient was provided an opportunity to ask questions and all were answered. The patient agreed with the plan and demonstrated an understanding of the instructions.   The patient was advised to call back or seek an in-person evaluation if the symptoms worsen or if the condition fails to improve as  anticipated.      Sharion Balloon, NP  02/19/2020 1:18 PM         Sharion Balloon, NP 02/19/20 1318

## 2020-03-08 ENCOUNTER — Ambulatory Visit: Payer: BC Managed Care – PPO | Admitting: Registered Nurse

## 2020-03-08 ENCOUNTER — Other Ambulatory Visit: Payer: Self-pay

## 2020-03-08 ENCOUNTER — Encounter: Payer: Self-pay | Admitting: Registered Nurse

## 2020-03-08 ENCOUNTER — Telehealth (INDEPENDENT_AMBULATORY_CARE_PROVIDER_SITE_OTHER): Payer: BC Managed Care – PPO | Admitting: Registered Nurse

## 2020-03-08 DIAGNOSIS — F411 Generalized anxiety disorder: Secondary | ICD-10-CM | POA: Diagnosis not present

## 2020-03-08 MED ORDER — ESCITALOPRAM OXALATE 10 MG PO TABS: 10.0000 mg | ORAL_TABLET | Freq: Every day | ORAL | 3 refills | Status: DC

## 2020-03-08 NOTE — Progress Notes (Signed)
f °

## 2020-03-08 NOTE — Patient Instructions (Signed)
° ° ° °  If you have lab work done today you will be contacted with your lab results within the next 2 weeks.  If you have not heard from us then please contact us. The fastest way to get your results is to register for My Chart. ° ° °IF you received an x-ray today, you will receive an invoice from [facility]. Please contact [facility] at [phone] with questions or concerns regarding your invoice.  ° °IF you received labwork today, you will receive an invoice from [facility]. Please contact [facility] at [phone] with questions or concerns regarding your invoice.  ° °Our billing staff will not be able to assist you with questions regarding bills from these companies. ° °You will be contacted with the lab results as soon as they are available. The fastest way to get your results is to activate your My Chart account. Instructions are located on the last page of this paperwork. If you have not heard from us regarding the results in 2 weeks, please contact this office. °  ° ° ° °

## 2020-03-08 NOTE — Progress Notes (Signed)
Telemedicine Encounter- SOAP NOTE Established Patient  This telephone encounter was conducted with the patient's (or proxy's) verbal consent via audio telecommunications: yes  Patient was instructed to have this encounter in a suitably private space; and to only have persons present to whom they give permission to participate. In addition, patient identity was confirmed by use of name plus two identifiers (DOB and address).  I discussed the limitations, risks, security and privacy concerns of performing an evaluation and management service by telephone and the availability of in person appointments. I also discussed with the patient that there may be a patient responsible charge related to this service. The patient expressed understanding and agreed to proceed.  I spent a total of 6 minutes talking with the patient or their proxy.  Chief Complaint  Patient presents with  . Follow-up    follow lexapro and patient states that everything is well with her and she states sha has no questions or concerns     Subjective   Debbie [name] is a 49 y.o. established patient. Telephone visit today for lexapro follow up   HPI Feels well overall without complaint Anxiety is much improved No more symptoms No further concerns Hopes to continue at 10mg  PO qd  Patient Active Problem List   Diagnosis Date Noted  . Abdominal mass 03/11/2019  . Post-operative state 05/18/2015  . Fibroid uterus 08/25/2013  . Anemia 03/13/2012  . DUB (dysfunctional uterine bleeding) 12/28/2011    Past Medical History:  Diagnosis Date  . Anemia   . Dysmenorrhea   . Headache(784.0)   . Hypoglycemia    possible per pt- becomes shaky and weak when hungry    Current Outpatient Medications  Medication Sig Dispense Refill  . ALPRAZolam (NIRAVAM) 0.25 MG dissolvable tablet Take 1-2 tablets (0.25-0.5 mg total) by mouth at bedtime as needed for anxiety. 30 tablet 0  . escitalopram (LEXAPRO) 10 MG tablet Take 1  tablet (10 mg total) by mouth daily. 90 tablet 3  . ondansetron (ZOFRAN) 4 MG tablet Take 1 tablet (4 mg total) by mouth every 6 (six) hours as needed for nausea or vomiting. 12 tablet 0   No current facility-administered medications for this visit.    Allergies  Allergen Reactions  . Penicillins Other (See Comments)    Not sure if she has true allergy to Penicillin but her father told her that she or one of her sisters had a reaction.    Social History   Socioeconomic History  . Marital status: Married    Spouse name: Not on file  . Number of children: Not on file  . Years of education: Not on file  . Highest education level: Not on file  Occupational History  . Not on file  Tobacco Use  . Smoking status: Never Smoker  . Smokeless tobacco: Never Used  Substance and Sexual Activity  . Alcohol use: Yes    Comment: occ  . Drug use: No  . Sexual activity: Yes    Birth control/protection: Surgical    Comment: BTL  Other Topics Concern  . Not on file  Social History Narrative  . Not on file   Social Determinants of Health   Financial Resource Strain:   . Difficulty of Paying Living Expenses:   Food Insecurity:   . Worried About Charity fundraiser in the Last Year:   . Arboriculturist in the Last Year:   Transportation Needs:   . Film/video editor (Medical):   Marland Kitchen  Lack of Transportation (Non-Medical):   Physical Activity:   . Days of Exercise per Week:   . Minutes of Exercise per Session:   Stress:   . Feeling of Stress :   Social Connections:   . Frequency of Communication with Friends and Family:   . Frequency of Social Gatherings with Friends and Family:   . Attends Religious Services:   . Active Member of Clubs or Organizations:   . Attends Archivist Meetings:   Marland Kitchen Marital Status:   Intimate Partner Violence:   . Fear of Current or Ex-Partner:   . Emotionally Abused:   Marland Kitchen Physically Abused:   . Sexually Abused:     ROS Per hpi, otherwise  negative on 10pt ros Objective   Vitals as reported by the patient: There were no vitals filed for this visit.  [name] was seen today for follow-up.  Diagnoses and all orders for this visit:  Generalized anxiety disorder -     escitalopram (LEXAPRO) 10 MG tablet; Take 1 tablet (10 mg total) by mouth daily.   PLAN  Continue lexapro 10mg  PO qd   Return annually or prn  Patient encouraged to call clinic with any questions, comments, or concerns.  I discussed the assessment and treatment plan with the patient. The patient was provided an opportunity to ask questions and all were answered. The patient agreed with the plan and demonstrated an understanding of the instructions.   The patient was advised to call back or seek an in-person evaluation if the symptoms worsen or if the condition fails to improve as anticipated.  I provided 6 minutes of non-face-to-face time during this encounter.  Maximiano Coss, NP  Primary Care at [facility]

## 2020-04-27 ENCOUNTER — Other Ambulatory Visit: Payer: Self-pay | Admitting: Registered Nurse

## 2020-04-27 ENCOUNTER — Encounter: Payer: Self-pay | Admitting: Registered Nurse

## 2020-04-27 DIAGNOSIS — F411 Generalized anxiety disorder: Secondary | ICD-10-CM

## 2020-07-30 ENCOUNTER — Other Ambulatory Visit: Payer: BC Managed Care – PPO

## 2020-07-30 DIAGNOSIS — Z20822 Contact with and (suspected) exposure to covid-19: Secondary | ICD-10-CM

## 2020-07-31 LAB — NOVEL CORONAVIRUS, NAA: SARS-CoV-2, NAA: NOT DETECTED

## 2020-07-31 LAB — SARS-COV-2, NAA 2 DAY TAT

## 2020-08-04 ENCOUNTER — Encounter: Payer: Self-pay | Admitting: Registered Nurse

## 2020-08-04 ENCOUNTER — Ambulatory Visit (INDEPENDENT_AMBULATORY_CARE_PROVIDER_SITE_OTHER): Payer: BC Managed Care – PPO | Admitting: Registered Nurse

## 2020-08-04 ENCOUNTER — Other Ambulatory Visit: Payer: Self-pay

## 2020-08-04 VITALS — BP 152/81 | HR 79 | Temp 97.9°F | Resp 18 | Ht 61.0 in | Wt 169.8 lb

## 2020-08-04 DIAGNOSIS — F411 Generalized anxiety disorder: Secondary | ICD-10-CM | POA: Diagnosis not present

## 2020-08-04 DIAGNOSIS — R899 Unspecified abnormal finding in specimens from other organs, systems and tissues: Secondary | ICD-10-CM

## 2020-08-04 NOTE — Patient Instructions (Signed)
° ° ° °  If you have lab work done today you will be contacted with your lab results within the next 2 weeks.  If you have not heard from us then please contact us. The fastest way to get your results is to register for My Chart. ° ° °IF you received an x-ray today, you will receive an invoice from [facility]. Please contact [facility] at [phone] with questions or concerns regarding your invoice.  ° °IF you received labwork today, you will receive an invoice from [facility]. Please contact [facility] at [phone] with questions or concerns regarding your invoice.  ° °Our billing staff will not be able to assist you with questions regarding bills from these companies. ° °You will be contacted with the lab results as soon as they are available. The fastest way to get your results is to activate your My Chart account. Instructions are located on the last page of this paperwork. If you have not heard from us regarding the results in 2 weeks, please contact this office. °  ° ° ° °

## 2020-10-19 ENCOUNTER — Encounter: Payer: Self-pay | Admitting: Registered Nurse

## 2020-10-19 NOTE — Progress Notes (Signed)
Established Patient Office Visit  Subjective:  Patient ID: Debbie [name], female    DOB: 11-13-1970  Age: 50 y.o. MRN: [id number]  CC:  Chief Complaint  Patient presents with  . Follow-up    Patient states she has been seen here before for anxiety now it has got a little bit worse, Patient would like to discuss lab results.    HPI Debbie [name] presents for anxiety  It has worsened - wants to discuss options Has been resistant to meds in the past  Wants to discuss lab results Reviewed these with patient.  Past Medical History:  Diagnosis Date  . Anemia   . Dysmenorrhea   . Headache(784.0)   . Hypoglycemia    possible per pt- becomes shaky and weak when hungry    Past Surgical History:  Procedure Laterality Date  . CESAREAN SECTION     X2  . ROBOTIC ASSISTED TOTAL HYSTERECTOMY WITH BILATERAL SALPINGO OOPHERECTOMY Right 05/18/2015   Procedure: ROBOTIC ASSISTED LAPAROSCOPIC  TOTAL HYSTERECTOMY WITH RIGHT SALPINGECTOMY, LYSIS OF ADHESIONS, CYSTOSCOPY;  Surgeon: Lavonia Drafts, MD;  Location: [facility];  Service: Gynecology;  Laterality: Right;  . TUBAL LIGATION      Family History  Problem Relation Age of Onset  . Cancer Paternal Aunt 5       BREAST CANCER  . Cancer Cousin        [name]  . Cancer Paternal Grandfather        prostate  . Hypertension Mother   . Diabetes Mother   . Hypertension Father   . Diabetes Father   . Anesthesia problems Neg Hx     Social History   Socioeconomic History  . Marital status: Married    Spouse name: Not on file  . Number of children: Not on file  . Years of education: Not on file  . Highest education level: Not on file  Occupational History  . Not on file  Tobacco Use  . Smoking status: Never Smoker  . Smokeless tobacco: Never Used  Substance and Sexual Activity  . Alcohol use: Yes    Comment: occ  . Drug use: No  . Sexual activity: Yes    Birth control/protection: Surgical    Comment:  BTL  Other Topics Concern  . Not on file  Social History Narrative  . Not on file   Social Determinants of Health   Financial Resource Strain: Not on file  Food Insecurity: Not on file  Transportation Needs: Not on file  Physical Activity: Not on file  Stress: Not on file  Social Connections: Not on file  Intimate Partner Violence: Not on file    Outpatient Medications Prior to Visit  Medication Sig Dispense Refill  . escitalopram (LEXAPRO) 10 MG tablet Take 1 tablet by mouth once daily 90 tablet 0  . ALPRAZolam (NIRAVAM) 0.25 MG dissolvable tablet Take 1-2 tablets (0.25-0.5 mg total) by mouth at bedtime as needed for anxiety. (Patient not taking: Reported on 08/04/2020) 30 tablet 0  . ondansetron (ZOFRAN) 4 MG tablet Take 1 tablet (4 mg total) by mouth every 6 (six) hours as needed for nausea or vomiting. (Patient not taking: Reported on 08/04/2020) 12 tablet 0   No facility-administered medications prior to visit.    Allergies  Allergen Reactions  . Penicillins Other (See Comments)    Not sure if she has true allergy to Penicillin but her father told her that she or one of her sisters had a reaction.  ROS Review of Systems  Constitutional: Negative.   HENT: Negative.   Eyes: Negative.   Respiratory: Negative.   Cardiovascular: Negative.   Gastrointestinal: Negative.   Genitourinary: Negative.   Musculoskeletal: Negative.   Skin: Negative.   Neurological: Negative.   Psychiatric/Behavioral: Negative.   All other systems reviewed and are negative.     Objective:    Physical Exam Vitals and nursing note reviewed.  Constitutional:      General: She is not in acute distress.    Appearance: Normal appearance. She is normal weight. She is not ill-appearing, toxic-appearing or diaphoretic.  Cardiovascular:     Rate and Rhythm: Normal rate and regular rhythm.     Heart sounds: Normal heart sounds. No murmur heard. No friction rub. No gallop.   Pulmonary:      Effort: Pulmonary effort is normal. No respiratory distress.     Breath sounds: Normal breath sounds. No stridor. No wheezing, rhonchi or rales.  Chest:     Chest wall: No tenderness.  Skin:    General: Skin is warm and dry.  Neurological:     General: No focal deficit present.     Mental Status: She is alert and oriented to person, place, and time. Mental status is at baseline.  Psychiatric:        Mood and Affect: Mood normal.        Behavior: Behavior normal.        Thought Content: Thought content normal.        Judgment: Judgment normal.     BP (!) 152/81   Pulse 79   Temp 97.9 F (36.6 C) (Temporal)   Resp 18   Ht 5\' 1"  (1.549 m)   Wt 169 lb 12.8 oz (77 kg)   LMP  (LMP Unknown) Comment: pt on a pill to stop menses  SpO2 99%   BMI 32.08 kg/m  Wt Readings from Last 3 Encounters:  08/04/20 169 lb 12.8 oz (77 kg)  02/09/20 162 lb (73.5 kg)  01/21/20 163 lb (73.9 kg)     Health Maintenance Due  Topic Date Due  . COLONOSCOPY (Pts 45-73yrs Insurance coverage will need to be confirmed)  Never done    There are no preventive care reminders to display for this patient.  Lab Results  Component Value Date   TSH 1.470 02/09/2020   Lab Results  Component Value Date   WBC 6.9 02/09/2020   HGB 13.4 02/09/2020   HCT 40.8 02/09/2020   MCV 84 02/09/2020   PLT 364 01/21/2020   Lab Results  Component Value Date   NA 141 02/09/2020   K 3.8 02/09/2020   CO2 25 02/09/2020   GLUCOSE 93 02/09/2020   BUN 8 02/09/2020   CREATININE 0.55 (L) 02/09/2020   BILITOT 0.4 02/09/2020   ALKPHOS 64 02/09/2020   AST 18 02/09/2020   ALT 13 02/09/2020   PROT 6.8 02/09/2020   ALBUMIN 4.2 02/09/2020   CALCIUM 9.2 02/09/2020   ANIONGAP 8 01/21/2020   Lab Results  Component Value Date   CHOL 166 02/09/2020   Lab Results  Component Value Date   HDL 32 (L) 02/09/2020   Lab Results  Component Value Date   LDLCALC 117 (H) 02/09/2020   Lab Results  Component Value Date   TRIG  93 02/09/2020   Lab Results  Component Value Date   CHOLHDL 5.2 (H) 02/09/2020   Lab Results  Component Value Date   HGBA1C 5.1 02/09/2020  Assessment & Plan:   Problem List Items Addressed This Visit   None   Visit Diagnoses    Generalized anxiety disorder    -  Primary   Abnormal laboratory test          No orders of the defined types were placed in this encounter.   Follow-up: No follow-ups on file.   PLAN  Pt declines treatment for anxiety  Reviewed labs - results reassuring  Return annually for cpe and labs  Patient encouraged to call clinic with any questions, comments, or concerns.  Maximiano Coss, NP

## 2020-10-21 ENCOUNTER — Other Ambulatory Visit: Payer: Self-pay | Admitting: Registered Nurse

## 2020-10-21 DIAGNOSIS — F411 Generalized anxiety disorder: Secondary | ICD-10-CM

## 2020-10-21 MED ORDER — ESCITALOPRAM OXALATE 10 MG PO TABS: 10.0000 mg | ORAL_TABLET | Freq: Every day | ORAL | 0 refills | Status: DC

## 2020-10-27 ENCOUNTER — Other Ambulatory Visit: Payer: Self-pay | Admitting: Registered Nurse

## 2020-10-27 DIAGNOSIS — Z1231 Encounter for screening mammogram for malignant neoplasm of breast: Secondary | ICD-10-CM

## 2020-11-08 ENCOUNTER — Ambulatory Visit
Admission: RE | Admit: 2020-11-08 | Discharge: 2020-11-08 | Disposition: A | Discharge status: Home / Self Care | Payer: BC Managed Care – PPO | Source: Ambulatory Visit

## 2020-11-08 ENCOUNTER — Other Ambulatory Visit: Payer: Self-pay

## 2020-11-08 DIAGNOSIS — Z1231 Encounter for screening mammogram for malignant neoplasm of breast: Secondary | ICD-10-CM

## 2021-03-27 ENCOUNTER — Emergency Department (HOSPITAL_COMMUNITY): Payer: BC Managed Care – PPO

## 2021-03-27 ENCOUNTER — Emergency Department (HOSPITAL_COMMUNITY)
Admission: EM | Admit: 2021-03-27 | Discharge: 2021-03-27 | Disposition: A | Discharge status: Home / Self Care | Payer: BC Managed Care – PPO | Attending: Emergency Medicine | Admitting: Emergency Medicine

## 2021-03-27 ENCOUNTER — Encounter (HOSPITAL_COMMUNITY): Payer: Self-pay | Admitting: Pharmacy Technician

## 2021-03-27 ENCOUNTER — Other Ambulatory Visit: Payer: Self-pay

## 2021-03-27 DIAGNOSIS — R072 Precordial pain: Secondary | ICD-10-CM

## 2021-03-27 DIAGNOSIS — R519 Headache, unspecified: Secondary | ICD-10-CM | POA: Diagnosis present

## 2021-03-27 LAB — CBC WITH DIFFERENTIAL/PLATELET
Abs Immature Granulocytes: 0.03 10*3/uL (ref 0.00–0.07)
Basophils Absolute: 0.1 10*3/uL (ref 0.0–0.1)
Basophils Relative: 1 %
Eosinophils Absolute: 0.1 10*3/uL (ref 0.0–0.5)
Eosinophils Relative: 1 %
HCT: 42.3 % (ref 36.0–46.0)
Hemoglobin: 13.5 g/dL (ref 12.0–15.0)
Immature Granulocytes: 0 %
Lymphocytes Relative: 26 %
Lymphs Abs: 2.5 10*3/uL (ref 0.7–4.0)
MCH: 27.1 pg (ref 26.0–34.0)
MCHC: 31.9 g/dL (ref 30.0–36.0)
MCV: 84.9 fL (ref 80.0–100.0)
Monocytes Absolute: 0.6 10*3/uL (ref 0.1–1.0)
Monocytes Relative: 6 %
Neutro Abs: 6.3 10*3/uL (ref 1.7–7.7)
Neutrophils Relative %: 66 %
Platelets: 381 10*3/uL (ref 150–400)
RBC: 4.98 MIL/uL (ref 3.87–5.11)
RDW: 14.2 % (ref 11.5–15.5)
WBC: 9.7 10*3/uL (ref 4.0–10.5)
nRBC: 0 % (ref 0.0–0.2)

## 2021-03-27 LAB — BASIC METABOLIC PANEL
Anion gap: 6 (ref 5–15)
BUN: 13 mg/dL (ref 6–20)
CO2: 27 mmol/L (ref 22–32)
Calcium: 9 mg/dL (ref 8.9–10.3)
Chloride: 104 mmol/L (ref 98–111)
Creatinine, Ser: 0.55 mg/dL (ref 0.44–1.00)
GFR, Estimated: >60 mL/min (ref >60)
Glucose, Bld: 93 mg/dL (ref 70–99)
Potassium: 3.8 mmol/L (ref 3.5–5.1)
Sodium: 137 mmol/L (ref 135–145)

## 2021-03-27 LAB — TROPONIN I (HIGH SENSITIVITY)
Troponin I (High Sensitivity): 3 ng/L (ref <18)
Troponin I (High Sensitivity): 3 ng/L (ref <18)

## 2021-03-27 MED ORDER — PROCHLORPERAZINE EDISYLATE 10 MG/2ML IJ SOLN
10.0000 mg | Freq: Once | INTRAMUSCULAR | Status: AC
  Administered 2021-03-27: 10 mg via INTRAVENOUS
  Filled 2021-03-27: qty 2

## 2021-03-27 MED ORDER — SODIUM CHLORIDE 0.9 % IV BOLUS
1000.0000 mL | Freq: Once | INTRAVENOUS | Status: AC
  Administered 2021-03-27: 1000 mL via INTRAVENOUS

## 2021-03-27 MED ORDER — DIPHENHYDRAMINE HCL 50 MG/ML IJ SOLN
12.5000 mg | Freq: Once | INTRAMUSCULAR | Status: AC
  Administered 2021-03-27: 12.5 mg via INTRAVENOUS
  Filled 2021-03-27: qty 1

## 2021-03-27 NOTE — ED Notes (Signed)
RN reviewed discharge instructions w/ pt. Pain management and follow up reviewed, pt had no further questions

## 2021-03-27 NOTE — Discharge Instructions (Addendum)
Return for new or worsening symptoms

## 2021-03-27 NOTE — ED Provider Notes (Signed)
[facility] EMERGENCY DEPARTMENT Provider Note   CSN: [id number] Arrival date & time: 03/27/21  1237    History HA   Debbie [name] is a 50 y.o. female with history significant for headaches, anemia who presents for evaluation of multiple complaints.  Patient states she has had a headache to the right side of her head over the last 3 days.  He also admits to photophobia.  She has history of migraines however states it has been many years since she has had one and this does not feel similar.  She denies any neck stiffness or neck rigidity.  No recent traumatic injuries.  She has not taken anything for symptoms.  She rates her pain a 7/10.  She does admit to some intermittent chest tightness which she describes as "pinpricks" which occur intermittently to bilateral sides of her chest.  Did not radiate.  Not worse with exertion.  No pleuritic chest pain.  No associated nausea, vomiting or diaphoresis.  She has no current pain.  Her last episode was this morning. No unilateal leg swelling, redness or warmth.  No prior history of PE or DVT.  She is also had some congestion and rhinorrhea.  No cough.  No known sick contacts.  Denies fever, chills, neck stiffness, nausea, facial droop, difficulty speaking, paresthesias, weakness, shortness of breath, diarrhea, dysuria, leg swelling.  Denies additional aggravating or alleviating factors.  History obtained from patient and past medical records.  Declined spanish interpreter used.  HPI     Past Medical History:  Diagnosis Date   Anemia    Dysmenorrhea    Headache(784.0)    Hypoglycemia    possible per pt- becomes shaky and weak when hungry    Patient Active Problem List   Diagnosis Date Noted   Abdominal mass 03/11/2019   Post-operative state 05/18/2015   Fibroid uterus 08/25/2013   Anemia 03/13/2012   DUB (dysfunctional uterine bleeding) 12/28/2011    Past Surgical History:  Procedure Laterality Date   CESAREAN  SECTION     X2   ROBOTIC ASSISTED TOTAL HYSTERECTOMY WITH BILATERAL SALPINGO OOPHERECTOMY Right 05/18/2015   Procedure: ROBOTIC ASSISTED LAPAROSCOPIC  TOTAL HYSTERECTOMY WITH RIGHT SALPINGECTOMY, LYSIS OF ADHESIONS, CYSTOSCOPY;  Surgeon: Lavonia Drafts, MD;  Location: [facility];  Service: Gynecology;  Laterality: Right;   TUBAL LIGATION       OB History     Gravida  4   Para  4   Term  4   Preterm  0   AB  0   Living  4      SAB  0   IAB  0   Ectopic  0   Multiple  0   Live Births  4           Family History  Problem Relation Age of Onset   Cancer Paternal Aunt 3       BREAST CANCER   Cancer Cousin        [name]   Cancer Paternal Grandfather        prostate   Hypertension Mother    Diabetes Mother    Hypertension Father    Diabetes Father    Anesthesia problems Neg Hx     Social History   Tobacco Use   Smoking status: Never   Smokeless tobacco: Never  Substance Use Topics   Alcohol use: Yes    Comment: occ   Drug use: No    Home Medications Prior to Admission  medications   Medication Sig Start Date End Date Taking? Authorizing Provider  escitalopram (LEXAPRO) 10 MG tablet Take 1 tablet (10 mg total) by mouth daily. Patient not taking: Reported on 03/27/2021 10/21/20   Maximiano Coss, NP    Allergies    Penicillins  Review of Systems   Review of Systems  Constitutional: Negative.   HENT: Negative.    Respiratory: Negative.    Cardiovascular:  Positive for chest pain. Negative for palpitations and leg swelling.  Gastrointestinal: Negative.   Genitourinary: Negative.   Musculoskeletal: Negative.   Skin: Negative.   Neurological:  Positive for headaches. Negative for dizziness, tremors, seizures, syncope, facial asymmetry, speech difficulty, weakness, light-headedness and numbness.  All other systems reviewed and are negative.  Physical Exam Updated Vital Signs BP 132/73   Pulse 63   Temp 99 F (37.2 C)   Resp  16   LMP  (LMP Unknown) Comment: pt on a pill to stop menses  SpO2 99%   Physical Exam Physical Exam  Constitutional: Pt is oriented to person, place, and time. Pt appears well-developed and well-nourished. No distress.  HENT:  Head: Normocephalic and atraumatic.  Mouth/Throat: Oropharynx is clear and moist.  Eyes: Conjunctivae and EOM are normal. Pupils are equal, round, and reactive to light. No scleral icterus.  No horizontal, vertical or rotational nystagmus  Neck: Normal range of motion. Neck supple.  Full active and passive ROM without pain No midline or paraspinal tenderness No nuchal rigidity or meningeal signs  Cardiovascular: Normal rate, regular rhythm and intact distal pulses.   Pulmonary/Chest: Effort normal and breath sounds normal. No respiratory distress. Pt has no wheezes. No rales.  Abdominal: Soft. Bowel sounds are normal. There is no tenderness. There is no rebound and no guarding.  Musculoskeletal: Normal range of motion.  Compartments soft.  No bony tenderness.  Bevelyn Buckles' sign negative bilaterally Lymphadenopathy:    No cervical adenopathy.  Neurological: Pt. is alert and oriented to person, place, and time. He has normal reflexes. No cranial nerve deficit.  Exhibits normal muscle tone. Coordination normal.  Mental Status:  Alert, oriented, thought content appropriate. Speech fluent without evidence of aphasia. Able to follow 2 step commands without difficulty.  Cranial Nerves:  II:  Peripheral visual fields grossly normal, pupils equal, round, reactive to light III,IV, VI: ptosis not present, extra-ocular motions intact bilaterally  V,VII: smile symmetric, facial light touch sensation equal VIII: hearing grossly normal bilaterally  IX,X: midline uvula rise  XI: bilateral shoulder shrug equal and strong XII: midline tongue extension  Motor:  5/5 in upper and lower extremities bilaterally including strong and equal grip strength and dorsiflexion/plantar  flexion Sensory: Pinprick and light touch normal in all extremities.  Deep Tendon Reflexes: 2+ and symmetric  Cerebellar: normal finger-to-nose with bilateral upper extremities Gait: normal gait and balance CV: distal pulses palpable throughout   Skin: Skin is warm and dry. No rash noted. Pt is not diaphoretic.  Psychiatric: Pt has a normal mood and affect. Behavior is normal. Judgment and thought content normal.  Nursing note and vitals reviewed.  ED Results / Procedures / Treatments   Labs (all labs ordered are listed, but only abnormal results are displayed) Labs Reviewed  CBC WITH DIFFERENTIAL/PLATELET  BASIC METABOLIC PANEL  TROPONIN I (HIGH SENSITIVITY)  TROPONIN I (HIGH SENSITIVITY)    EKG EKG Interpretation  Date/Time:  Sunday March 27 2021 18:35:57 EDT Ventricular Rate:  66 PR Interval:  162 QRS Duration: 83 QT Interval:  424 QTC Calculation:  445 R Axis:   57 Text Interpretation: Sinus rhythm Confirmed by Davonna Belling [phone]) on 03/27/2021 8:53:09 PM  Radiology DG Chest 2 View  Result Date: 03/27/2021 CLINICAL DATA:  Chest pain. EXAM: CHEST - 2 VIEW COMPARISON:  January 21, 2020 FINDINGS: The heart size and mediastinal contours are within normal limits. Both lungs are clear. The visualized skeletal structures are unremarkable. IMPRESSION: No active cardiopulmonary disease. Electronically Signed   By: Dorise Bullion III M.D   On: 03/27/2021 17:50   CT Head Wo Contrast  Result Date: 03/27/2021 CLINICAL DATA:  Chronic headache. Pain 2 right-sided head and down right shoulder. EXAM: CT HEAD WITHOUT CONTRAST TECHNIQUE: Contiguous axial images were obtained from the base of the skull through the vertex without intravenous contrast. COMPARISON:  None. FINDINGS: Brain: No evidence of acute infarction, hemorrhage, hydrocephalus, extra-axial collection or mass lesion/mass effect. Vascular: No hyperdense vessel or unexpected calcification. Skull: Normal. Negative for fracture or  focal lesion. Sinuses/Orbits: No acute finding. Other: None. IMPRESSION: Normal brain.  No cause for the patient's symptoms identified. Electronically Signed   By: Dorise Bullion III M.D   On: 03/27/2021 17:49    Procedures Procedures   Medications Ordered in ED Medications  sodium chloride 0.9 % bolus 1,000 mL (0 mLs Intravenous Stopped 03/27/21 1940)  prochlorperazine (COMPAZINE) injection 10 mg (10 mg Intravenous Given 03/27/21 1827)  diphenhydrAMINE (BENADRYL) injection 12.5 mg (12.5 mg Intravenous Given 03/27/21 1826)    ED Course  I have reviewed the triage vital signs and the nursing notes.  Pertinent labs & imaging results that were available during my care of the patient were reviewed by me and considered in my medical decision making (see chart for details).  50 year old presents for evaluation of headache.  She is afebrile, nonseptic, not ill-appearing.  No sudden onset headache.  He has a nonfocal neuro exam without deficits.  Does have history of migraines however states this feels different than her prior migraine.  She has no neck stiffness or neck rigidity.  She has no meningismus.  Low suspicion for meningitis.  Pain is unilateral on the right side.  No vision changes, facial pain, paresthesias.  Low suspicion for trigeminal neuralgia, arteritis.  In HPI she does note that she has had some intermittent, pinprick" chest pain which began yesterday.  Last episode this morning.  States this will "jump around" to different occasions on her chest.  This is nonexertional nonpleuritic in nature.  No back pain, left arm or jaw pain.  No associated abdominal pain.  She is Wells criteria low risk.  She is without tachycardia, tachypnea or hypoxia.  We will plan on labs, imaging and reassess.  Labs and imaging tests reviewed and interpreted:  CBC without leukocytosis, hemoglobin 13.5 BMP without acute electrolyte, renal or liver abnormality Trop 3>>3 DG chest without acute infiltrates,  cardiomegaly, pulm edema, pneumothorax CT head without acute abnormality EKG without ischemic changes.  No Wellens   Patient reassessed.  Headache resolved with migraine cocktail.  Pending delta troponin, symptoms do not seem consistent with ACS, PE or dissection.  Heart score 1- Age  Presentation not concerning for [facility], ICH, CVA, dissection, meningitis, or temporal arteritis. Pt is afebrile with no focal neuro deficits, nuchal rigidity, or change in vision.   Chest pain is not likely of cardiac or pulmonary etiology d/t presentation, PERC negative, VSS, no tracheal deviation, no JVD or new murmur, RRR, breath sounds equal bilaterally, EKG without acute abnormalities, negative troponin, and negative CXR. Pt  has been advised to return to the ED if CP becomes exertional, associated with diaphoresis or nausea, radiates to left jaw/arm, worsens or becomes concerning in any way.    The patient has been appropriately medically screened and/or stabilized in the ED. I have low suspicion for any other emergent medical condition which would require further screening, evaluation or treatment in the ED or require inpatient management.  Patient is hemodynamically stable and in no acute distress.  Patient able to ambulate in department prior to ED.  Evaluation does not show acute pathology that would require ongoing or additional emergent interventions while in the emergency department or further inpatient treatment.  I have discussed the diagnosis with the patient and answered all questions.  Pain is been managed while in the emergency department and patient has no further complaints prior to discharge.  Patient is comfortable with plan discussed in room and is stable for discharge at this time.  I have discussed strict return precautions for returning to the emergency department.  Patient was encouraged to follow-up with PCP/specialist refer to at discharge.      MDM Rules/Calculators/A&P                           Laasia [name] was evaluated in Emergency Department on 03/27/2021 for the symptoms described in the history of present illness. She was evaluated in the context of the global COVID-19 pandemic, which necessitated consideration that the patient might be at risk for infection with the SARS-CoV-2 virus that causes COVID-19. Institutional protocols and algorithms that pertain to the evaluation of patients at risk for COVID-19 are in a state of rapid change based on information released by regulatory bodies including the CDC and federal and state organizations. These policies and algorithms were followed during the patient's care in the ED.  Final Clinical Impression(s) / ED Diagnoses Final diagnoses:  Acute nonintractable headache, unspecified headache type  Precordial pain    Rx / DC Orders ED Discharge Orders     None        Janille Draughon A, PA-C 03/27/21 2113    Davonna Belling, MD 03/28/21 1456

## 2021-03-27 NOTE — ED Triage Notes (Signed)
Pt here with reports of headache X3 days. Endorses pain to the R side of her head down into her R shoulder. Pt states she has taken tylenol and ibuprofen with minimal relief.

## 2021-04-02 IMAGING — US US PELVIS COMPLETE WITH TRANSVAGINAL
1 series · 15 of 25 positions shown · non-contrast
Comparison: 03/30/2015 [facility] sonogram.

CLINICAL DATA: 48-year-old female with a history of total
hysterectomy and right salpingectomy 05/18/2015, now presenting with
right lower abdominal pain.

EXAM:
TRANSABDOMINAL AND TRANSVAGINAL ULTRASOUND OF PELVIS
TECHNIQUE: Both transabdominal and transvaginal ultrasound examinations of the
pelvis were performed. Transabdominal technique was performed for
global imaging of the pelvis including vaginal cuff, ovaries,
adnexal regions, and pelvic cul-de-sac. It was necessary to proceed
with endovaginal exam following the transabdominal exam to visualize
the vaginal cuff and adnexa.

[Series 1: us pelvis complete with transvaginal · 15 of 98 slices shown]
[im 1/98]
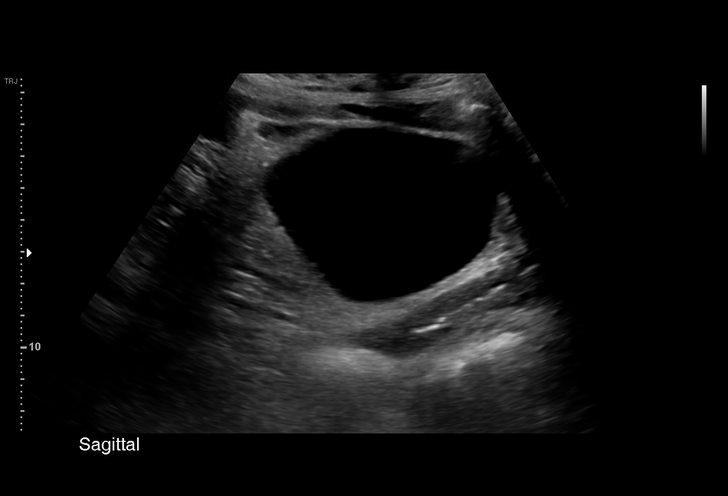
[im 9/98]
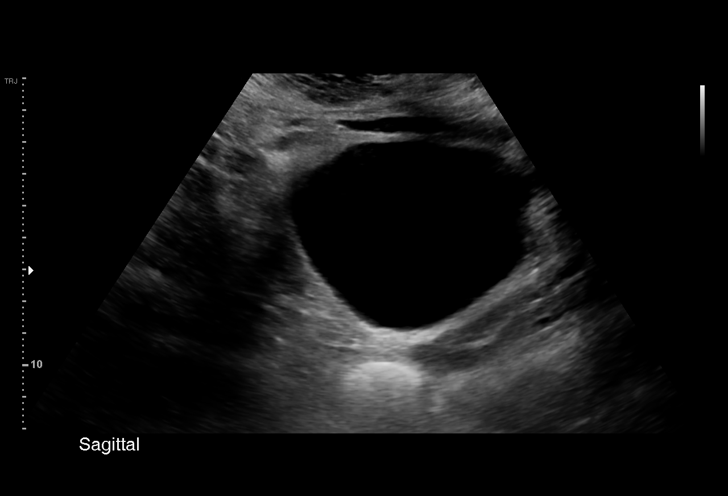
[im 17/98]
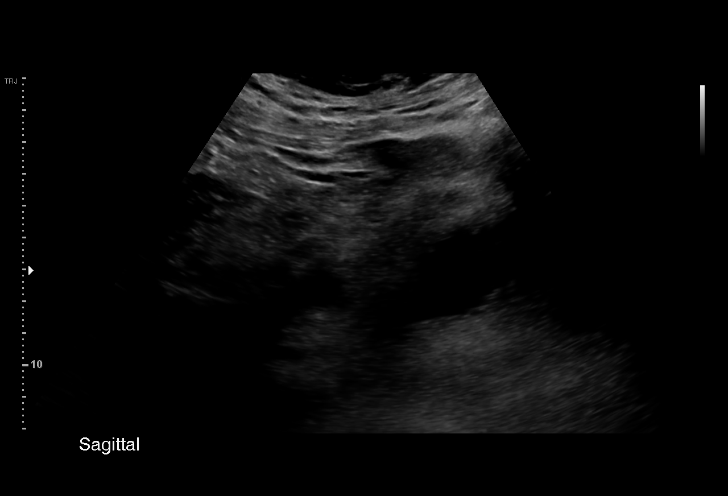
[im 21/98]
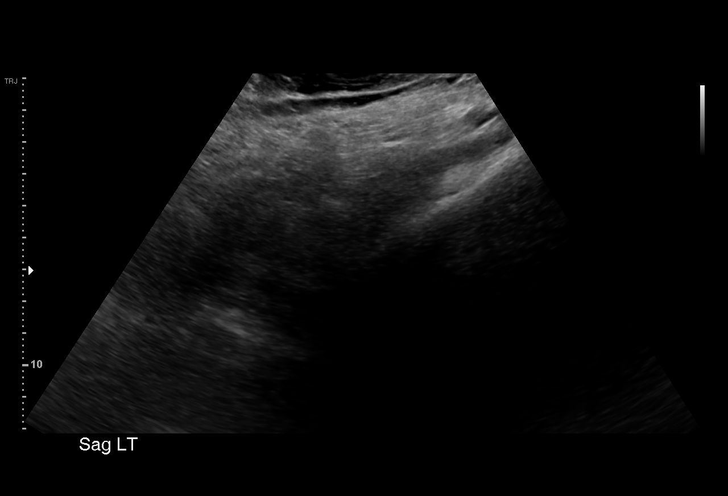
[im 29/98]
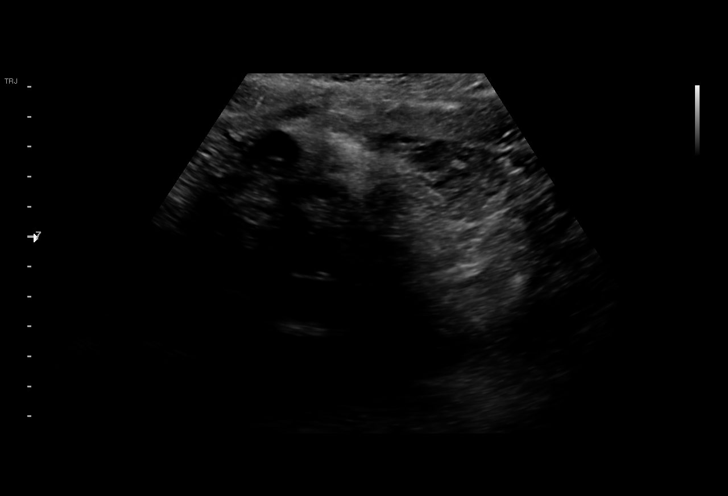
[im 37/98]
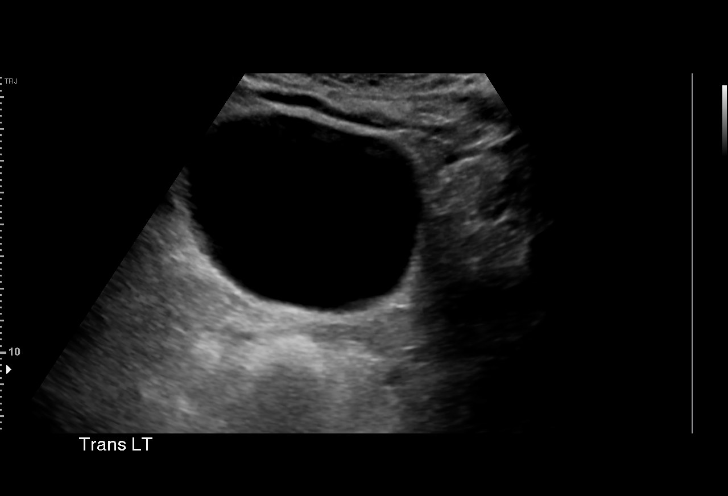
[im 41/98]
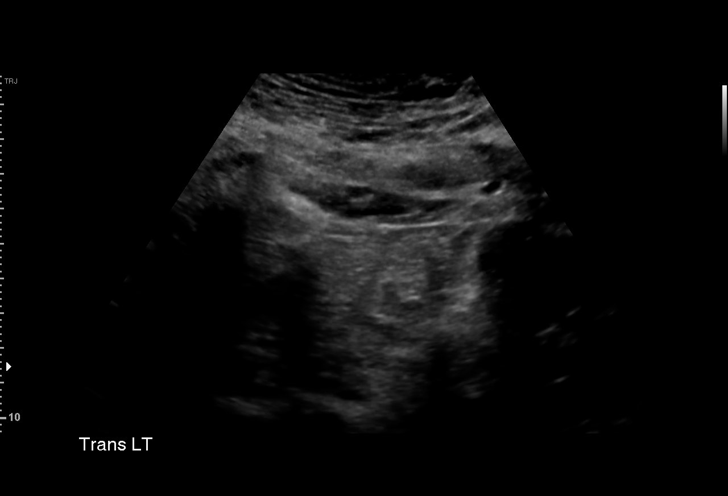
[im 49/98]
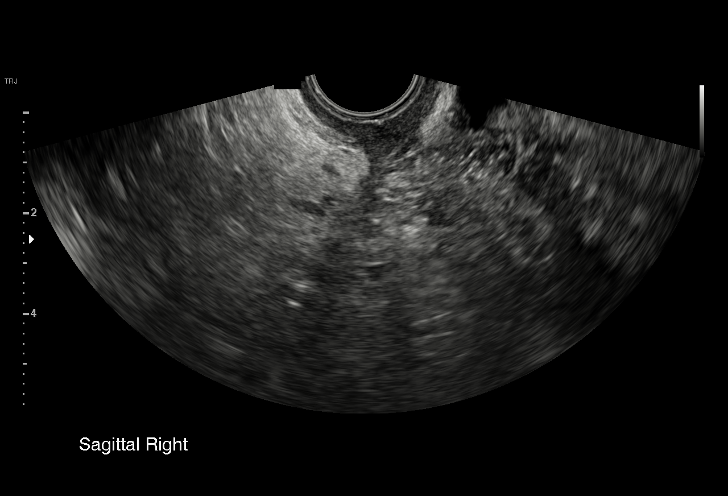
[im 57/98]
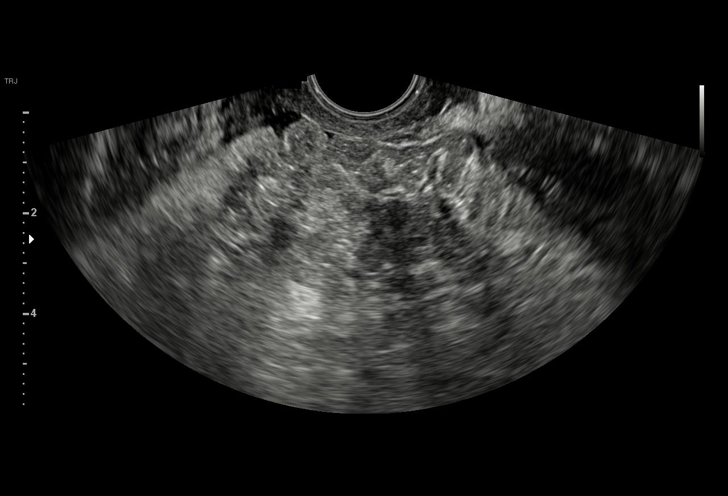
[im 61/98]
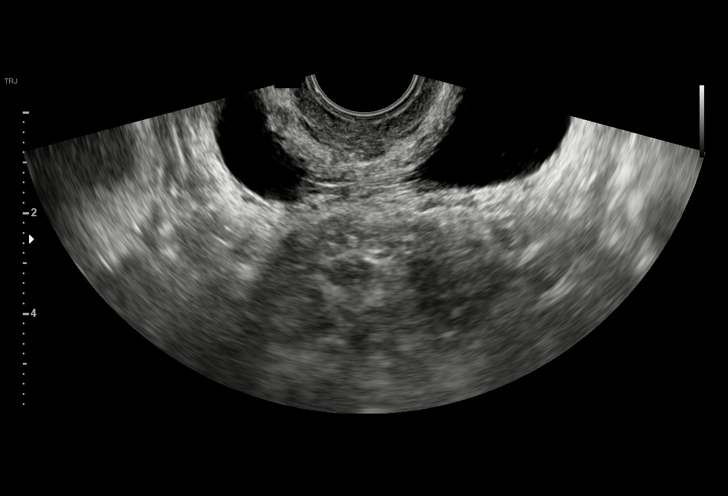
[im 69/98]
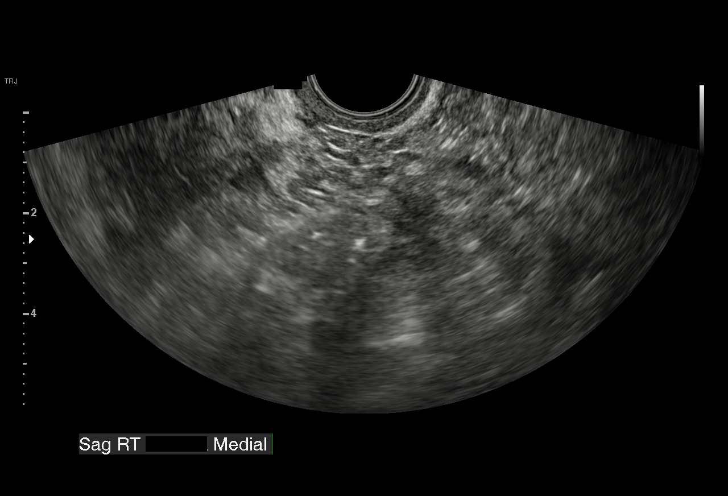
[im 77/98]
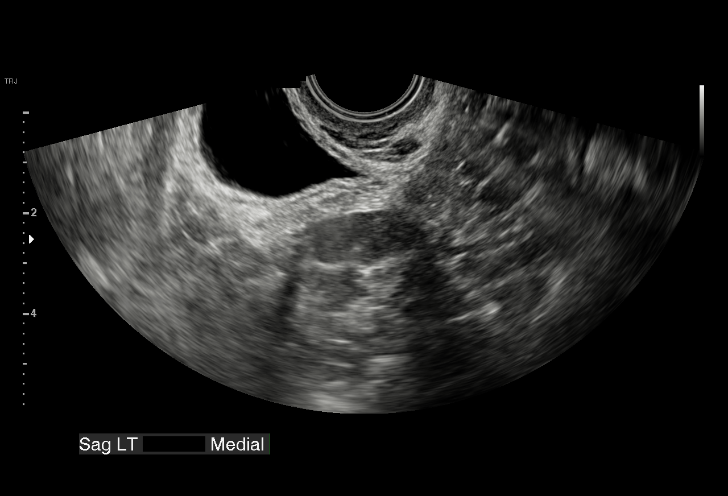
[im 81/98]
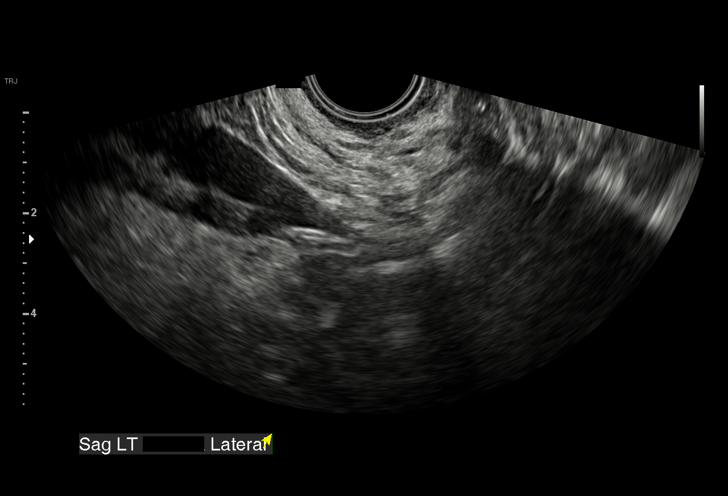
[im 89/98]
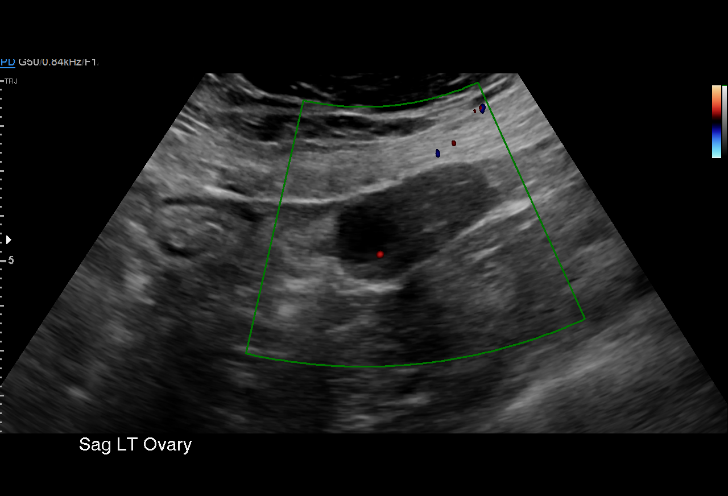
[im 98/98]
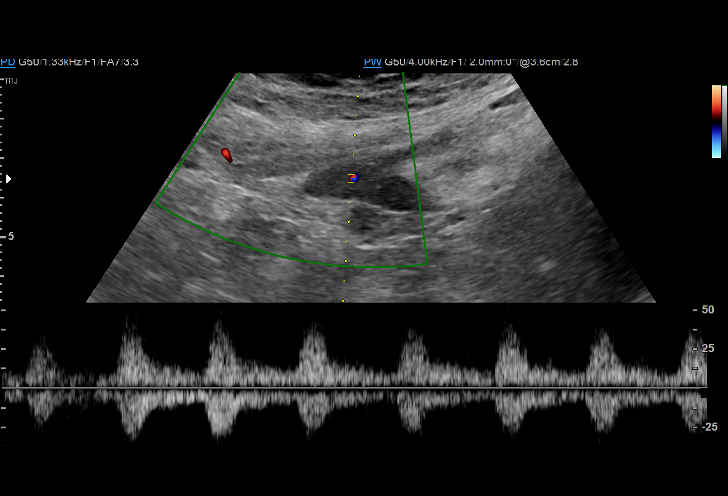

[15 of 25 positions shown; findings below may reference images not displayed]

FINDINGS: Uterus

Status post hysterectomy. No mass or fluid collection at the vaginal
cuff.

Right ovary

Measurements: 3.3 x 1.7 x 2.3 cm (transabdominal measurements) =
volume: 6.7 mL. Normal appearance/no adnexal mass.

Left ovary

Measurements: 3.8 x 1.8 x 2.5 cm (transabdominal measurements) =
volume: 8.8 mL. Normal appearance/no adnexal mass.

Other findings

Trace simple free fluid in the pelvic cul-de-sac.

Incidentally noted hypoechoic 3.8 x 2.0 x 3.4 cm masslike focus with
irregular outer contour located in the ventral abdomen just to the
right of midline near the umbilicus with internal vascularity on
color and spectral Doppler.
IMPRESSION: 1. Normal ovaries.  No adnexal masses.
2. No abnormal findings at the vaginal cuff status posthysterectomy.
Nonspecific trace simple free fluid in the pelvic cul-de-sac.
3. Indeterminate incidental hypoechoic 3.8 x 2.0 x 3.4 cm irregular
masslike focus in the ventral abdomen just to the right of midline
near the umbilicus, with internal vascularity on Doppler. Per the
ultrasound technologist, this mass is not in close proximity to the
ovaries. Further evaluation is recommended with CT abdomen/pelvis
with oral and IV contrast. Neoplastic etiology not excluded.

## 2021-04-11 ENCOUNTER — Other Ambulatory Visit: Payer: Self-pay

## 2021-04-11 ENCOUNTER — Ambulatory Visit: Payer: BC Managed Care – PPO | Admitting: Physician Assistant

## 2021-04-11 VITALS — BP 120/82 | HR 102 | Temp 98.2°F | Ht 61.0 in | Wt 169.2 lb

## 2021-04-11 DIAGNOSIS — R030 Elevated blood-pressure reading, without diagnosis of hypertension: Secondary | ICD-10-CM | POA: Diagnosis not present

## 2021-04-11 DIAGNOSIS — Z733 Stress, not elsewhere classified: Secondary | ICD-10-CM

## 2021-04-11 DIAGNOSIS — Z566 Other physical and mental strain related to work: Secondary | ICD-10-CM | POA: Diagnosis not present

## 2021-04-11 NOTE — Patient Instructions (Signed)
Good to meet you today. See you back in approximately 4 weeks for a physical and to follow up on stress and blood pressure.

## 2021-04-11 NOTE — Progress Notes (Signed)
Established Patient Office Visit  Subjective:  Patient ID: Debbie [name], female    DOB: 04-23-1971  Age: 50 y.o. MRN: [id number]  CC:  Chief Complaint  Patient presents with   Hypertension    HPI Debbie [name] presents for transfer of care visit from Maximiano Coss, NP.  She is here with her daughter, who is helping with translation.  She states that she is concerned about elevated blood pressure readings.  Seems to be going up and down she says.  Highest numbers she has seen is in the 150s over 90s.  She has been tracking this on her phone and shows me her values today.  These numbers have been slowly starting to come down over the last week and has an average of about 130/80.  She notes the readings are always higher at work.  She works at [employer] and says this is a newer position for her in the last month and she experiences a high number of stress there.   Past Medical History:  Diagnosis Date   Anemia    Dysmenorrhea    Headache(784.0)    Hypoglycemia    possible per pt- becomes shaky and weak when hungry    Past Surgical History:  Procedure Laterality Date   CESAREAN SECTION     X2   ROBOTIC ASSISTED TOTAL HYSTERECTOMY WITH BILATERAL SALPINGO OOPHERECTOMY Right 05/18/2015   Procedure: ROBOTIC ASSISTED LAPAROSCOPIC  TOTAL HYSTERECTOMY WITH RIGHT SALPINGECTOMY, LYSIS OF ADHESIONS, CYSTOSCOPY;  Surgeon: Lavonia Drafts, MD;  Location: [facility];  Service: Gynecology;  Laterality: Right;   TUBAL LIGATION      Family History  Problem Relation Age of Onset   Cancer Paternal Aunt 21       BREAST CANCER   Cancer Cousin        [name]   Cancer Paternal Grandfather        prostate   Hypertension Mother    Diabetes Mother    Hypertension Father    Diabetes Father    Anesthesia problems Neg Hx     Social History   Socioeconomic History   Marital status: Married    Spouse name: Not on file   Number of children: Not on file    Years of education: Not on file   Highest education level: Not on file  Occupational History   Not on file  Tobacco Use   Smoking status: Never   Smokeless tobacco: Never  Substance and Sexual Activity   Alcohol use: Yes    Comment: occ   Drug use: No   Sexual activity: Yes    Birth control/protection: Surgical    Comment: BTL  Other Topics Concern   Not on file  Social History Narrative   Not on file   Social Determinants of Health   Financial Resource Strain: Not on file  Food Insecurity: Not on file  Transportation Needs: Not on file  Physical Activity: Not on file  Stress: Not on file  Social Connections: Not on file  Intimate Partner Violence: Not on file    Outpatient Medications Prior to Visit  Medication Sig Dispense Refill   escitalopram (LEXAPRO) 10 MG tablet Take 1 tablet (10 mg total) by mouth daily. (Patient not taking: Reported on 03/27/2021) 90 tablet 0   No facility-administered medications prior to visit.    Allergies  Allergen Reactions   Penicillins Other (See Comments)    Not sure if she has true allergy to Penicillin but her  father told her that she or one of her sisters had a reaction.    ROS Review of Systems  Constitutional:  Negative for activity change, appetite change, chills, fatigue, fever and unexpected weight change.  Respiratory:  Negative for chest tightness.   Cardiovascular:  Negative for chest pain, palpitations and leg swelling.  Neurological:  Positive for headaches. Negative for dizziness, facial asymmetry, speech difficulty, weakness and light-headedness.  Hematological:  Negative for adenopathy. Does not bruise/bleed easily.  Psychiatric/Behavioral:  Negative for agitation and behavioral problems.      Objective:    Physical Exam Vitals and nursing note reviewed.  Constitutional:      General: She is not in acute distress.    Appearance: Normal appearance. She is obese. She is not ill-appearing.  Cardiovascular:      Rate and Rhythm: Normal rate and regular rhythm.     Pulses: Normal pulses.     Heart sounds: Normal heart sounds. No murmur heard. Pulmonary:     Effort: Pulmonary effort is normal. No respiratory distress.     Breath sounds: Normal breath sounds.  Neurological:     General: No focal deficit present.     Mental Status: She is alert and oriented to person, place, and time.  Psychiatric:        Mood and Affect: Mood normal.        Behavior: Behavior normal.        Thought Content: Thought content normal.    BP 120/82   Pulse (!) 102   Temp 98.2 F (36.8 C)   Ht 5\' 1"  (1.549 m)   Wt 169 lb 3.2 oz (76.7 kg)   LMP  (LMP Unknown) Comment: pt on a pill to stop menses  SpO2 99%   BMI 31.97 kg/m  Wt Readings from Last 3 Encounters:  04/11/21 169 lb 3.2 oz (76.7 kg)  08/04/20 169 lb 12.8 oz (77 kg)  02/09/20 162 lb (73.5 kg)     Health Maintenance Due  Topic Date Due   TETANUS/TDAP  Never done   COLONOSCOPY (Pts 45-62yrs Insurance coverage will need to be confirmed)  Never done   Zoster Vaccines- Shingrix (1 of 2) Never done    There are no preventive care reminders to display for this patient.  Lab Results  Component Value Date   TSH 1.470 02/09/2020   Lab Results  Component Value Date   WBC 9.7 03/27/2021   HGB 13.5 03/27/2021   HCT 42.3 03/27/2021   MCV 84.9 03/27/2021   PLT 381 03/27/2021   Lab Results  Component Value Date   NA 137 03/27/2021   K 3.8 03/27/2021   CO2 27 03/27/2021   GLUCOSE 93 03/27/2021   BUN 13 03/27/2021   CREATININE 0.55 03/27/2021   BILITOT 0.4 02/09/2020   ALKPHOS 64 02/09/2020   AST 18 02/09/2020   ALT 13 02/09/2020   PROT 6.8 02/09/2020   ALBUMIN 4.2 02/09/2020   CALCIUM 9.0 03/27/2021   ANIONGAP 6 03/27/2021   Lab Results  Component Value Date   CHOL 166 02/09/2020   Lab Results  Component Value Date   HDL 32 (L) 02/09/2020   Lab Results  Component Value Date   LDLCALC 117 (H) 02/09/2020   Lab Results  Component  Value Date   TRIG 93 02/09/2020   Lab Results  Component Value Date   CHOLHDL 5.2 (H) 02/09/2020   Lab Results  Component Value Date   HGBA1C 5.1 02/09/2020  Assessment & Plan:   Problem List Items Addressed This Visit   None   No orders of the defined types were placed in this encounter.   Follow-up: No follow-ups on file.   1. Stress at work 2. Elevated blood pressure, situational I reassured the patient and her daughter that her blood pressure looks good in our office today.  She is not working today.  It does seem like the increase in her blood pressure seems to be related to stress at her work.  I was happy to write a letter for her requesting that her workplace consider changing positions for her there into something slower paced.  She is going to continue to track this at home and bring log to a next appointment.  She is going to work on increasing exercise and working on a low-sodium diet.  Goal BP is less than 140/90 and she is aware of this.  She will call sooner if she has any problems.  This note was prepared with assistance of Systems analyst. Occasional wrong-word or sound-a-like substitutions may have occurred due to the inherent limitations of voice recognition software.  Total time spent on encounter including interview with patient and reviewing plan with her was 39 minutes.  Malvina Schadler M Tammee Thielke, PA-C

## 2021-05-09 ENCOUNTER — Telehealth: Payer: Self-pay

## 2021-05-09 ENCOUNTER — Encounter: Payer: Self-pay | Admitting: Physician Assistant

## 2021-05-09 NOTE — Telephone Encounter (Signed)
Patient has called stating that Yetta Flock has completed forms for her employer to transfer job positions.  States the employer is requesting another form to be completed.    Patient is going to send these forms through My Chart.    Please follow up in regard with patient.

## 2021-05-10 NOTE — Telephone Encounter (Signed)
Forms received.  

## 2021-05-11 NOTE — Telephone Encounter (Signed)
Appt scheduled

## 2021-05-16 ENCOUNTER — Encounter: Payer: BC Managed Care – PPO | Admitting: Physician Assistant

## 2021-05-16 NOTE — Telephone Encounter (Signed)
Called patient to  r/s since Debbie [name] is out for the week. Patient was upset she stated she need her paperwork filled out today for work .

## 2021-05-19 NOTE — Telephone Encounter (Signed)
Disregard previous message. Left voice message for patient to call clinic.

## 2021-05-19 NOTE — Telephone Encounter (Signed)
Notified patient of lab results.Patient voices understanding.  

## 2021-05-23 ENCOUNTER — Encounter: Payer: Self-pay | Admitting: Physician Assistant

## 2021-05-23 ENCOUNTER — Other Ambulatory Visit: Payer: Self-pay

## 2021-05-23 ENCOUNTER — Ambulatory Visit (INDEPENDENT_AMBULATORY_CARE_PROVIDER_SITE_OTHER): Payer: BC Managed Care – PPO | Admitting: Physician Assistant

## 2021-05-23 VITALS — BP 142/84 | HR 85 | Temp 98.4°F | Resp 98 | Ht 61.0 in | Wt 170.2 lb

## 2021-05-23 DIAGNOSIS — Z1211 Encounter for screening for malignant neoplasm of colon: Secondary | ICD-10-CM

## 2021-05-23 DIAGNOSIS — Z0001 Encounter for general adult medical examination with abnormal findings: Secondary | ICD-10-CM | POA: Diagnosis not present

## 2021-05-23 DIAGNOSIS — Z0000 Encounter for general adult medical examination without abnormal findings: Secondary | ICD-10-CM

## 2021-05-23 DIAGNOSIS — I10 Essential (primary) hypertension: Secondary | ICD-10-CM

## 2021-05-23 DIAGNOSIS — Z23 Encounter for immunization: Secondary | ICD-10-CM

## 2021-05-23 MED ORDER — HYDROCHLOROTHIAZIDE 12.5 MG PO TABS
12.5000 mg | ORAL_TABLET | Freq: Every day | ORAL | 0 refills | Status: DC
Start: 2021-05-23 — End: 2021-06-21

## 2021-05-23 NOTE — Progress Notes (Signed)
Established Patient Office Visit  Subjective:  Patient ID: Debbie [name], female    DOB: [birth year]  Age: 50 y.o. MRN: [id number]  CC:  Chief Complaint  Patient presents with   Annual Exam   Stress    HPI Debbie [name] presents for annual CPE.  Acute concerns: Feels the same as last visit in regards to stress and blood pressure readings. Averaging 130s/90s. States that readings are generally higher at work than at home.   Health maintenance: Lifestyle/ exercise: Walking daily Nutrition: Rice, beans, cooks at home often; tries to do low-salt Mental health: Stressed at work, otherwise life is good Caffeine: Coffee in the mornings Sleep: No issues Substance use: None Immunizations: Needs Tdap - today. Considering Shingrix.  Colonoscopy: Will place referral today Pap: Hx of hysterectomy Mammogram: UTD   Past Medical History:  Diagnosis Date   Anemia    Dysmenorrhea    Headache(784.0)    Hypoglycemia    possible per pt- becomes shaky and weak when hungry    Past Surgical History:  Procedure Laterality Date   CESAREAN SECTION     X2   ROBOTIC ASSISTED TOTAL HYSTERECTOMY WITH BILATERAL SALPINGO OOPHERECTOMY Right 05/18/2015   Procedure: ROBOTIC ASSISTED LAPAROSCOPIC  TOTAL HYSTERECTOMY WITH RIGHT SALPINGECTOMY, LYSIS OF ADHESIONS, CYSTOSCOPY;  Surgeon: Lavonia Drafts, MD;  Location: [facility];  Service: Gynecology;  Laterality: Right;   TUBAL LIGATION      Family History  Problem Relation Age of Onset   Cancer Paternal Aunt 1       BREAST CANCER   Cancer Cousin        [name]   Cancer Paternal Grandfather        prostate   Hypertension Mother    Diabetes Mother    Hypertension Father    Diabetes Father    Anesthesia problems Neg Hx     Social History   Socioeconomic History   Marital status: Married    Spouse name: Not on file   Number of children: Not on file   Years of education: Not on file   Highest education level: Not on  file  Occupational History   Not on file  Tobacco Use   Smoking status: Never   Smokeless tobacco: Never  Substance and Sexual Activity   Alcohol use: Yes    Comment: occ   Drug use: No   Sexual activity: Yes    Birth control/protection: Surgical    Comment: BTL  Other Topics Concern   Not on file  Social History Narrative   Not on file   Social Determinants of Health   Financial Resource Strain: Not on file  Food Insecurity: Not on file  Transportation Needs: Not on file  Physical Activity: Not on file  Stress: Not on file  Social Connections: Not on file  Intimate Partner Violence: Not on file    No outpatient medications prior to visit.   No facility-administered medications prior to visit.    Allergies  Allergen Reactions   Penicillins Other (See Comments)    Not sure if she has true allergy to Penicillin but her father told her that she or one of her sisters had a reaction.    ROS Review of Systems  Constitutional:  Negative for activity change, appetite change, fever and unexpected weight change.  HENT:  Negative for congestion.   Eyes:  Negative for visual disturbance.  Respiratory:  Negative for apnea, cough and shortness of breath.   Cardiovascular:  Negative  for chest pain, palpitations and leg swelling.  Gastrointestinal:  Negative for abdominal pain, blood in stool, constipation and diarrhea.  Endocrine: Negative for polydipsia, polyphagia and polyuria.  Genitourinary:  Negative for dysuria and pelvic pain.  Musculoskeletal:  Negative for arthralgias.  Skin:  Negative for rash.  Neurological:  Negative for dizziness, weakness and headaches.  Hematological:  Negative for adenopathy. Does not bruise/bleed easily.  Psychiatric/Behavioral:  Negative for sleep disturbance and suicidal ideas. The patient is not nervous/anxious.      Objective:    Physical Exam Vitals and nursing note reviewed.  Constitutional:      General: She is not in acute  distress.    Appearance: Normal appearance. She is normal weight.  HENT:     Head: Normocephalic.     Right Ear: External ear normal.     Left Ear: External ear normal.     Nose: Nose normal.     Mouth/Throat:     Mouth: Mucous membranes are moist.  Eyes:     Extraocular Movements: Extraocular movements intact.     Conjunctiva/sclera: Conjunctivae normal.     Pupils: Pupils are equal, round, and reactive to light.  Cardiovascular:     Rate and Rhythm: Normal rate and regular rhythm.     Pulses: Normal pulses.     Heart sounds: No murmur heard. Pulmonary:     Effort: Pulmonary effort is normal.     Breath sounds: Normal breath sounds.  Abdominal:     General: Abdomen is flat. Bowel sounds are normal.     Palpations: Abdomen is soft.     Tenderness: There is no abdominal tenderness.  Musculoskeletal:        General: Normal range of motion.     Cervical back: Normal range of motion.  Skin:    General: Skin is warm.  Neurological:     General: No focal deficit present.     Mental Status: She is alert and oriented to person, place, and time.     Gait: Gait normal.  Psychiatric:        Mood and Affect: Mood normal.        Behavior: Behavior normal.    BP (!) 142/84   Pulse 85   Temp 98.4 F (36.9 C)   Resp (!) 98   Ht '5\' 1"'$  (1.549 m)   Wt 170 lb 3.2 oz (77.2 kg)   LMP  (LMP Unknown) Comment: pt on a pill to stop menses  BMI 32.16 kg/m  Wt Readings from Last 3 Encounters:  05/23/21 170 lb 3.2 oz (77.2 kg)  04/11/21 169 lb 3.2 oz (76.7 kg)  08/04/20 169 lb 12.8 oz (77 kg)     Health Maintenance Due  Topic Date Due   COLONOSCOPY (Pts 45-55yr Insurance coverage will need to be confirmed)  Never done   COVID-19 Vaccine (3 - Booster for [employer]) 06/17/2020   Zoster Vaccines- Shingrix (1 of 2) Never done   INFLUENZA VACCINE  05/16/2021    There are no preventive care reminders to display for this patient.  Lab Results  Component Value Date   TSH 1.470  02/09/2020   Lab Results  Component Value Date   WBC 9.7 03/27/2021   HGB 13.5 03/27/2021   HCT 42.3 03/27/2021   MCV 84.9 03/27/2021   PLT 381 03/27/2021   Lab Results  Component Value Date   NA 137 03/27/2021   K 3.8 03/27/2021   CO2 27 03/27/2021   GLUCOSE  93 03/27/2021   BUN 13 03/27/2021   CREATININE 0.55 03/27/2021   BILITOT 0.4 02/09/2020   ALKPHOS 64 02/09/2020   AST 18 02/09/2020   ALT 13 02/09/2020   PROT 6.8 02/09/2020   ALBUMIN 4.2 02/09/2020   CALCIUM 9.0 03/27/2021   ANIONGAP 6 03/27/2021   Lab Results  Component Value Date   CHOL 166 02/09/2020   Lab Results  Component Value Date   HDL 32 (L) 02/09/2020   Lab Results  Component Value Date   LDLCALC 117 (H) 02/09/2020   Lab Results  Component Value Date   TRIG 93 02/09/2020   Lab Results  Component Value Date   CHOLHDL 5.2 (H) 02/09/2020   Lab Results  Component Value Date   HGBA1C 5.1 02/09/2020      Assessment & Plan:   Problem List Items Addressed This Visit   None Visit Diagnoses     Encounter for annual physical exam    -  Primary   Relevant Orders   Tdap vaccine greater than or equal to 7yo IM (Completed)   Screening for [name] cancer       Relevant Orders   Ambulatory referral to Gastroenterology   Essential hypertension       Relevant Medications   hydrochlorothiazide (HYDRODIURIL) 12.5 MG tablet   Need for tetanus booster       Relevant Orders   Tdap vaccine greater than or equal to 7yo IM (Completed)       Meds ordered this encounter  Medications   hydrochlorothiazide (HYDRODIURIL) 12.5 MG tablet    Sig: Take 1 tablet (12.5 mg total) by mouth daily.    Dispense:  30 tablet    Refill:  0    Follow-up: Return in about 4 weeks (around 06/20/2021) for BP recheck .   1. Encounter for annual physical exam 2. Screening for [name] cancer Age-appropriate screening and counseling performed today.  She recently had labs done and we will not repeat those today.  Referral  sent for colonoscopy.  She is going to consider the Shingrix vaccine.  She was encouraged to work on healthy lifestyle changes.  Preventive measures discussed and printed in AVS for patient.   3. Essential hypertension She has a blood pressure log with her today that she shows me.  She is averaging in the 130s over 90s.  Today she is elevated in office as well.  I do think it is smart to start on a blood pressure medication at this time and she is agreeable.  Several options discussed and we will start with hydrochlorothiazide 12.5 mg daily, as she notes that she does have some swelling in her fingers in the mornings.  Advised her to avoid stress is much as possible.  She notes that she continues to have stress at her job and she has a form with her today, which I will complete for her and note her issue with blood pressure readings right now.  Also recommend low-salt diet.  4. Need for tetanus booster This was updated in our office today.   Halo Shevlin M Elchonon Maxson, PA-C

## 2021-05-23 NOTE — Patient Instructions (Addendum)
Good to see you again!  Please talk with your pharmacy about the Shingles Vaccine (Shingrix).  Referral placed for colonoscopy. Tetanus shot updated today.  Monitor your blood pressure readings. Start on hydrochlorothiazide daily. Recheck in one month. Call sooner if any issues.

## 2021-05-31 ENCOUNTER — Encounter: Payer: Self-pay | Admitting: Physician Assistant

## 2021-06-17 ENCOUNTER — Other Ambulatory Visit: Payer: Self-pay | Admitting: Physician Assistant

## 2021-06-22 ENCOUNTER — Ambulatory Visit: Payer: BC Managed Care – PPO | Admitting: Physician Assistant

## 2021-06-22 ENCOUNTER — Other Ambulatory Visit: Payer: Self-pay

## 2021-06-22 VITALS — BP 120/85 | HR 77 | Temp 97.2°F | Ht 61.0 in | Wt 173.2 lb

## 2021-06-22 DIAGNOSIS — I10 Essential (primary) hypertension: Secondary | ICD-10-CM

## 2021-06-22 DIAGNOSIS — F411 Generalized anxiety disorder: Secondary | ICD-10-CM | POA: Diagnosis not present

## 2021-06-22 MED ORDER — HYDROCHLOROTHIAZIDE 12.5 MG PO TABS: 12.5000 mg | ORAL_TABLET | Freq: Every day | ORAL | 1 refills | Status: DC

## 2021-06-22 NOTE — Patient Instructions (Signed)
I'm glad you are doing better! Continue on the hydrochlorothiazide and lexapro daily. See you back in about 4 months. Call sooner if any concerns.

## 2021-06-22 NOTE — Progress Notes (Signed)
Established Patient Office Visit  Subjective:  Patient ID: Debbie [name], female    DOB: 1971-04-29  Age: 50 y.o. MRN: [id number]  CC:  Chief Complaint  Patient presents with   Hypertension    HPI Debbie [name] presents for blood pressure recheck. She has been taking HCTZ 12.5 mg daily since our last visit and denies any side effects or problems with the medication.  She has a log of her blood pressures on her phone with her and her average reading is in the 120s to 130s over 70s to 80s.  She notes that she has been under additional stress recently and has noticed that her blood pressure does go up at these times.  She has a bottle with her of Lexapro 10 mg from her previous provider and says she has been taking this, which has helped with some of her stress levels.  Past Medical History:  Diagnosis Date   Anemia    Dysmenorrhea    Headache(784.0)    Hypoglycemia    possible per pt- becomes shaky and weak when hungry    Past Surgical History:  Procedure Laterality Date   CESAREAN SECTION     X2   ROBOTIC ASSISTED TOTAL HYSTERECTOMY WITH BILATERAL SALPINGO OOPHERECTOMY Right 05/18/2015   Procedure: ROBOTIC ASSISTED LAPAROSCOPIC  TOTAL HYSTERECTOMY WITH RIGHT SALPINGECTOMY, LYSIS OF ADHESIONS, CYSTOSCOPY;  Surgeon: Lavonia Drafts, MD;  Location: [facility];  Service: Gynecology;  Laterality: Right;   TUBAL LIGATION      Family History  Problem Relation Age of Onset   Cancer Paternal Aunt 5       BREAST CANCER   Cancer Cousin        [name]   Cancer Paternal Grandfather        prostate   Hypertension Mother    Diabetes Mother    Hypertension Father    Diabetes Father    Anesthesia problems Neg Hx     Social History   Socioeconomic History   Marital status: Married    Spouse name: Not on file   Number of children: Not on file   Years of education: Not on file   Highest education level: Not on file  Occupational History   Not on file   Tobacco Use   Smoking status: Never   Smokeless tobacco: Never  Substance and Sexual Activity   Alcohol use: Yes    Comment: occ   Drug use: No   Sexual activity: Yes    Birth control/protection: Surgical    Comment: BTL  Other Topics Concern   Not on file  Social History Narrative   Not on file   Social Determinants of Health   Financial Resource Strain: Not on file  Food Insecurity: Not on file  Transportation Needs: Not on file  Physical Activity: Not on file  Stress: Not on file  Social Connections: Not on file  Intimate Partner Violence: Not on file    Outpatient Medications Prior to Visit  Medication Sig Dispense Refill   escitalopram (LEXAPRO) 10 MG tablet Take 10 mg by mouth daily.     hydrochlorothiazide (HYDRODIURIL) 12.5 MG tablet Take 1 tablet (12.5 mg total) by mouth daily. 90 tablet 0   No facility-administered medications prior to visit.    Allergies  Allergen Reactions   Penicillins Other (See Comments)    Not sure if she has true allergy to Penicillin but her father told her that she or one of her sisters had a reaction.  ROS Review of Systems REFER TO HPI FOR PERTINENT POSITIVES AND NEGATIVES    Objective:    Physical Exam Vitals and nursing note reviewed.  Constitutional:      General: She is not in acute distress.    Appearance: Normal appearance. She is obese. She is not ill-appearing.  Cardiovascular:     Rate and Rhythm: Normal rate and regular rhythm.     Pulses: Normal pulses.     Heart sounds: Normal heart sounds. No murmur heard. Pulmonary:     Effort: Pulmonary effort is normal. No respiratory distress.     Breath sounds: Normal breath sounds.  Neurological:     General: No focal deficit present.     Mental Status: She is alert and oriented to person, place, and time.  Psychiatric:        Mood and Affect: Mood normal.        Behavior: Behavior normal.        Thought Content: Thought content normal.    BP 120/85    Pulse 77   Temp (!) 97.2 F (36.2 C)   Ht '5\' 1"'$  (1.549 m)   Wt 173 lb 3.2 oz (78.6 kg)   LMP  (LMP Unknown) Comment: pt on a pill to stop menses  SpO2 98%   BMI 32.73 kg/m  Wt Readings from Last 3 Encounters:  06/22/21 173 lb 3.2 oz (78.6 kg)  05/23/21 170 lb 3.2 oz (77.2 kg)  04/11/21 169 lb 3.2 oz (76.7 kg)     Health Maintenance Due  Topic Date Due   COLONOSCOPY (Pts 45-93yr Insurance coverage will need to be confirmed)  Never done   COVID-19 Vaccine (3 - Booster for [employer]) 06/17/2020   Zoster Vaccines- Shingrix (1 of 2) Never done   INFLUENZA VACCINE  Never done    There are no preventive care reminders to display for this patient.  Lab Results  Component Value Date   TSH 1.470 02/09/2020   Lab Results  Component Value Date   WBC 9.7 03/27/2021   HGB 13.5 03/27/2021   HCT 42.3 03/27/2021   MCV 84.9 03/27/2021   PLT 381 03/27/2021   Lab Results  Component Value Date   NA 137 03/27/2021   K 3.8 03/27/2021   CO2 27 03/27/2021   GLUCOSE 93 03/27/2021   BUN 13 03/27/2021   CREATININE 0.55 03/27/2021   BILITOT 0.4 02/09/2020   ALKPHOS 64 02/09/2020   AST 18 02/09/2020   ALT 13 02/09/2020   PROT 6.8 02/09/2020   ALBUMIN 4.2 02/09/2020   CALCIUM 9.0 03/27/2021   ANIONGAP 6 03/27/2021   Lab Results  Component Value Date   CHOL 166 02/09/2020   Lab Results  Component Value Date   HDL 32 (L) 02/09/2020   Lab Results  Component Value Date   LDLCALC 117 (H) 02/09/2020   Lab Results  Component Value Date   TRIG 93 02/09/2020   Lab Results  Component Value Date   CHOLHDL 5.2 (H) 02/09/2020   Lab Results  Component Value Date   HGBA1C 5.1 02/09/2020      Assessment & Plan:   Problem List Items Addressed This Visit   None Visit Diagnoses     Essential hypertension    -  Primary   Relevant Medications   hydrochlorothiazide (HYDRODIURIL) 12.5 MG tablet   Generalized anxiety disorder       Relevant Medications   escitalopram  (LEXAPRO) 10 MG tablet  Meds ordered this encounter  Medications   hydrochlorothiazide (HYDRODIURIL) 12.5 MG tablet    Sig: Take 1 tablet (12.5 mg total) by mouth daily.    Dispense:  90 tablet    Refill:  1    1. Essential hypertension She is currently at goal.  She will continue taking HCTZ 12.5 mg daily.  This medication was refilled #90 with 1 refill.  Also encouraged her to work on healthy diet and exercise.  Drink plenty of water.  2. Generalized anxiety disorder Under some stress recently, but says that Lexapro 10 mg is helping.  She has a full bottle of medication left and will call for refills as needed.  She does not want any additional help at this time.   Follow-up: Return for 4 months med recheck with Tesa Meadors .    Shondra Capps M Heavan Francom, PA-C

## 2021-07-04 ENCOUNTER — Ambulatory Visit: Payer: BC Managed Care – PPO | Admitting: Physician Assistant

## 2021-07-21 DIAGNOSIS — N80129 Deep endometriosis of ovary, unspecified ovary: Secondary | ICD-10-CM | POA: Diagnosis not present

## 2021-07-28 ENCOUNTER — Ambulatory Visit (INDEPENDENT_AMBULATORY_CARE_PROVIDER_SITE_OTHER): Payer: BC Managed Care – PPO | Admitting: Gastroenterology

## 2021-07-28 ENCOUNTER — Encounter: Payer: Self-pay | Admitting: Gastroenterology

## 2021-07-28 DIAGNOSIS — R9389 Abnormal findings on diagnostic imaging of other specified body structures: Secondary | ICD-10-CM

## 2021-07-28 DIAGNOSIS — R1900 Intra-abdominal and pelvic swelling, mass and lump, unspecified site: Secondary | ICD-10-CM | POA: Diagnosis not present

## 2021-07-28 NOTE — Progress Notes (Signed)
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 That was prior to knowing that surgery had been scheduled. We have cancelled her CT scan.   Surgery planned for resection of low grade leiomyoma 08/31/21. Will plan colonoscopy for [name] cancer screening after she fully recovers from surgery. Unclear if some of her constipation symptoms are related to the mass.     PLAN: - Colonoscopy with 2 day bowel prep after she fully recovers from surgery - Consider trial of Miralax 17 grams daily for constipation in the meantime  I consented the patient discussing the risks, benefits, and alternatives to endoscopic evaluation. In particular, we discussed the risks that include, but are not limited to, reaction to medication, cardiopulmonary compromise, bleeding requiring blood transfusion, aspiration resulting in pneumonia, perforation requiring surgery, lack of diagnosis, severe illness requiring hospitalization, and even death. The patient acknowledges these risks and asks that we proceed.    HPI: Debbie [name] is a 50 y.o. female referred for [name] cancer screening. Office visit was scheduled prior to colonoscopy given her complaints about chronic constipation and ongoing abdominal pain.  History is obtained through the patient and review of her electronic health record including [place].  She has a history of  hypertension, C-section, and hysterectomy.  Evaluated at [facility] in 2021 after a 1.9 x 3.2 x 3.8cm right lower quadrant mass was found on CT scan.  Surgical pathology revealed spindle cell proliferation and findings suggestive of a leiomyoma.  The patient was stable on follow-up imaging after 1 year and she elected not to proceed with resection. They recommended CT surveillance in 1 year. She saw surgery last week and they have planned resection 08/31/21.   Has chronic constipation with progressive right lower abdominal pain and bloating between bowel movements. Reports a longstanding history of constipation. Is now using OTC medications to have a bowel movement if she has not had a bowel movement after a week. Will go for one week between bowel movements. Constipation is worsened by oral iron supplements. She is unsure if the associated. Uses Tylenol and ibuprofen. No blood or mucous.    No prior [name] cancer screening or endoscopy.  Cousin with [name] cancer. Sister with breast cancer. No other family history of [name] cancer or polyps.   Normal calcium and CBC 03/2021  Prior abdominal imaging: CT of the abdomen pelvis 07/04/2020 showed no acute findings for abdominal pain, previously biopsied mass involving the ventral aspect of the right lower abdominal/pelvic wall was unchanged compared to 2020 again measuring 4.6 cm.  Past Medical History:  Diagnosis Date   Anemia    Dysmenorrhea    Headache(784.0)    Hypoglycemia    possible per pt- becomes shaky and weak when hungry    Past Surgical History:  Procedure Laterality Date   CESAREAN SECTION     X2   ROBOTIC ASSISTED TOTAL HYSTERECTOMY WITH BILATERAL SALPINGO OOPHERECTOMY Right 05/18/2015  Procedure: ROBOTIC ASSISTED LAPAROSCOPIC  TOTAL HYSTERECTOMY WITH RIGHT SALPINGECTOMY, LYSIS OF ADHESIONS, CYSTOSCOPY;  Surgeon: Lavonia Drafts, MD;  Location: [facility];  Service: Gynecology;  Laterality: Right;   TUBAL LIGATION        Current Outpatient Medications  Medication Sig Dispense Refill   escitalopram (LEXAPRO) 10 MG tablet Take 10 mg by mouth daily.     hydrochlorothiazide (HYDRODIURIL) 12.5 MG tablet Take 1 tablet (12.5 mg total) by mouth daily. 90 tablet 1   No current facility-administered medications for this visit.    Allergies as of 07/28/2021 - Review Complete 07/28/2021  Allergen Reaction Noted   Penicillins Other (See Comments) 05/04/2015    Family History  Problem Relation Age of Onset   Hypertension Mother    Diabetes Mother    Hypertension Father    Diabetes Father    Breast cancer Sister    Cancer Paternal Aunt 57       BREAST CANCER   Cancer Paternal Grandfather        prostate   [name] cancer Cousin    Cancer Cousin        [name]   Anesthesia problems Neg Hx        Review of Systems: 12 system ROS is negative except as noted above with the additional of back pain and anxiety.   Physical Exam: General:   Alert,  well-nourished, pleasant and cooperative in NAD Head:  Normocephalic and atraumatic. Eyes:  Sclera clear, no icterus.   Conjunctiva pink. Ears:  Normal auditory acuity. Nose:  No deformity, discharge,  or lesions. Mouth:  No deformity or lesions.   Neck:  Supple; no masses or thyromegaly. Lungs:  Clear throughout to auscultation.   No wheezes. Heart:  Regular rate and rhythm; no murmurs. Abdomen:  Soft, nontender, nondistended, normal bowel sounds, no rebound or guarding. No obvious mass. No hepatosplenomegaly.   Rectal:  Deferred  Msk:  Symmetrical. No boney deformities LAD: No inguinal or umbilical LAD Extremities:  No clubbing or edema. Neurologic:  Alert and  oriented x4;  grossly nonfocal Skin:  Intact without significant lesions or rashes. Psych:  Alert and cooperative. Normal mood and affect.     Darthula Desa L. Tarri Glenn, MD, MPH 07/28/2021, 4:17 PM

## 2021-07-28 NOTE — Patient Instructions (Addendum)
If you are age 50 or younger, your body mass index should be between 19-25. Your Body mass index is 32.57 kg/m. If this is out of the aformentioned range listed, please consider follow up with your Primary Care Provider.  __________________________________________________________  The [facility] providers would like to encourage you to use [facility] to communicate with providers for non-urgent requests or questions.  Due to long hold times on the telephone, sending your provider a message by [facility] may be a faster and more efficient way to get a response.  Please allow 48 business hours for a response.  Please remember that this is for non-urgent requests.   You will be scheduled for a 2 day bowel prep colonoscopy after upcoming surgery.  Please contact our office when you are ready to schedule.   Thank you for entrusting me with your care and choosing [facility].  Dr Tarri Glenn

## 2021-08-04 ENCOUNTER — Other Ambulatory Visit: Payer: BC Managed Care – PPO

## 2021-08-09 ENCOUNTER — Encounter: Payer: Self-pay | Admitting: Physician Assistant

## 2021-08-09 ENCOUNTER — Ambulatory Visit: Payer: BC Managed Care – PPO | Admitting: Physician Assistant

## 2021-08-09 ENCOUNTER — Other Ambulatory Visit: Payer: Self-pay

## 2021-08-09 VITALS — BP 131/84 | HR 82 | Temp 98.2°F | Ht 61.0 in | Wt 175.2 lb

## 2021-08-09 DIAGNOSIS — R350 Frequency of micturition: Secondary | ICD-10-CM | POA: Diagnosis not present

## 2021-08-09 DIAGNOSIS — R3129 Other microscopic hematuria: Secondary | ICD-10-CM

## 2021-08-09 DIAGNOSIS — R1031 Right lower quadrant pain: Secondary | ICD-10-CM | POA: Diagnosis not present

## 2021-08-09 LAB — POCT URINALYSIS DIPSTICK
Blood, UA: POSITIVE
Glucose, UA: NEGATIVE
Ketones, UA: NEGATIVE
Leukocytes, UA: NEGATIVE
Nitrite, UA: NEGATIVE
Protein, UA: NEGATIVE
Spec Grav, UA: 1.02 (ref 1.010–1.025)
Urobilinogen, UA: 0.2 E.U./dL
pH, UA: 6 (ref 5.0–8.0)

## 2021-08-09 MED ORDER — TAMSULOSIN HCL 0.4 MG PO CAPS: 0.4000 mg | ORAL_CAPSULE | Freq: Every day | ORAL | 0 refills | Status: AC

## 2021-08-09 MED ORDER — NITROFURANTOIN MONOHYD MACRO 100 MG PO CAPS: 100.0000 mg | ORAL_CAPSULE | Freq: Two times a day (BID) | ORAL | 0 refills | Status: AC

## 2021-08-09 NOTE — Progress Notes (Signed)
Subjective:    Patient ID: Debbie Debbie [name], female    DOB: 1971/02/21, 50 y.o.   MRN: [id number]  Chief Complaint  Patient presents with   Abdominal Pain    Lower right side     HPI Patient is in today for right lower abdominal pain and it does radiate to her back x 3 days. Lying down and walking slowly helps alleviate the pain.  Comes and goes and is intermittently sharper than other times.  She has not tried any medications for the pain.  No fever or chills. No change in bowel movements.  She is having urinary frequency and noted dysuria today.  Able to eat.   Past Medical History:  Diagnosis Date   Anemia    Dysmenorrhea    Headache(784.0)    Hypoglycemia    possible per pt- becomes shaky and weak when hungry    Past Surgical History:  Procedure Laterality Date   CESAREAN SECTION     X2   ROBOTIC ASSISTED TOTAL HYSTERECTOMY WITH BILATERAL SALPINGO OOPHERECTOMY Right 05/18/2015   Procedure: ROBOTIC ASSISTED LAPAROSCOPIC  TOTAL HYSTERECTOMY WITH RIGHT SALPINGECTOMY, LYSIS OF ADHESIONS, CYSTOSCOPY;  Surgeon: Lavonia Drafts, MD;  Location: [facility];  Service: Gynecology;  Laterality: Right;   TUBAL LIGATION      Family History  Problem Relation Age of Onset   Hypertension Mother    Diabetes Mother    Hypertension Father    Diabetes Father    Breast cancer Sister    Cancer Paternal Aunt 96       BREAST CANCER   Cancer Paternal Grandfather        prostate   Debbie [name] cancer Cousin    Cancer Cousin        Debbie [name]   Anesthesia problems Neg Hx     Social History   Tobacco Use   Smoking status: Never   Smokeless tobacco: Never  Vaping Use   Vaping Use: Never used  Substance Use Topics   Alcohol use: Yes    Comment: occ   Drug use: No     Allergies  Allergen Reactions   Penicillins Other (See Comments)    Not sure if she has true allergy to Penicillin but her father told her that she or one of her sisters had a reaction.    Review of  Systems REFER TO HPI FOR PERTINENT POSITIVES AND NEGATIVES      Objective:     BP 131/84   Pulse 82   Temp 98.2 F (36.8 C)   Ht 5\' 1"  (1.549 m)   Wt 175 lb 3.2 oz (79.5 kg)   LMP  (LMP Unknown) Comment: pt on a pill to stop menses  SpO2 97%   BMI 33.10 kg/m   Wt Readings from Last 3 Encounters:  08/09/21 175 lb 3.2 oz (79.5 kg)  07/28/21 172 lb 6.4 oz (78.2 kg)  06/22/21 173 lb 3.2 oz (78.6 kg)    BP Readings from Last 3 Encounters:  08/09/21 131/84  07/28/21 116/80  06/22/21 120/85     Physical Exam Vitals and nursing note reviewed.  Constitutional:      General: She is not in acute distress.    Appearance: Normal appearance. She is not ill-appearing.  HENT:     Head: Normocephalic and atraumatic.  Cardiovascular:     Rate and Rhythm: Normal rate and regular rhythm.     Pulses: Normal pulses.     Heart sounds: Normal heart sounds.  Pulmonary:     Effort: Pulmonary effort is normal.     Breath sounds: Normal breath sounds.  Abdominal:     General: Abdomen is flat. Bowel sounds are normal.     Palpations: Abdomen is soft.     Tenderness: There is no right CVA tenderness or left CVA tenderness.  Skin:    General: Skin is warm and dry.  Neurological:     General: No focal deficit present.     Mental Status: She is alert.  Psychiatric:        Mood and Affect: Mood normal.       Assessment & Plan:   Problem List Items Addressed This Visit   None Visit Diagnoses     Right lower quadrant abdominal pain    -  Primary   Relevant Orders   POCT urinalysis dipstick (Completed)   Urine Culture   Urinary frequency       Relevant Orders   POCT urinalysis dipstick (Completed)   Urine Culture   Microscopic hematuria       Relevant Orders   Urine Culture        Meds ordered this encounter  Medications   tamsulosin (FLOMAX) 0.4 MG CAPS capsule    Sig: Take 1 capsule (0.4 mg total) by mouth daily for 10 days.    Dispense:  10 capsule    Refill:  0    nitrofurantoin, macrocrystal-monohydrate, (MACROBID) 100 MG capsule    Sig: Take 1 capsule (100 mg total) by mouth 2 (two) times daily for 7 days.    Dispense:  14 capsule    Refill:  0    1. Right lower quadrant abdominal pain 2. Urinary frequency 3. Microscopic hematuria There is presence of microscopic hematuria on urinalysis.  It is possible that she might have a kidney stone based on her symptoms.  She does not have an acute abdomen.  McBurney point and psoas sign negative therefore I do not feel like she has acute appendicitis.  I am going to treat her with Macrobid and Flomax.  I have encouraged her to push fluids.  Low threshold for emergency department if her pain worsens or she has any acute worsening change in symptoms.  She is going to call with updates for me.   This note was prepared with assistance of Systems analyst. Occasional wrong-word or sound-a-like substitutions may have occurred due to the inherent limitations of voice recognition software.  Time Spent: 25 minutes of total time was spent on the date of the encounter performing the following actions: chart review prior to seeing the patient, obtaining history, performing a medically necessary exam, counseling on the treatment plan, placing orders, and documenting in our EHR.    Ulanda Tackett M Ellianne Gowen, PA-C

## 2021-08-11 LAB — URINE CULTURE
MICRO NUMBER:: 12548278
SPECIMEN QUALITY:: ADEQUATE

## 2021-09-02 ENCOUNTER — Emergency Department (HOSPITAL_COMMUNITY)
Admission: EM | Admit: 2021-09-02 | Discharge: 2021-09-02 | Disposition: A | Discharge status: Home / Self Care | Payer: BC Managed Care – PPO | Attending: Emergency Medicine | Admitting: Emergency Medicine

## 2021-09-02 ENCOUNTER — Other Ambulatory Visit: Payer: Self-pay

## 2021-09-02 ENCOUNTER — Emergency Department (HOSPITAL_COMMUNITY): Payer: BC Managed Care – PPO

## 2021-09-02 ENCOUNTER — Encounter (HOSPITAL_COMMUNITY): Payer: Self-pay | Admitting: Emergency Medicine

## 2021-09-02 DIAGNOSIS — M545 Low back pain, unspecified: Secondary | ICD-10-CM | POA: Insufficient documentation

## 2021-09-02 DIAGNOSIS — N9489 Other specified conditions associated with female genital organs and menstrual cycle: Secondary | ICD-10-CM | POA: Insufficient documentation

## 2021-09-02 DIAGNOSIS — Z79899 Other long term (current) drug therapy: Secondary | ICD-10-CM | POA: Insufficient documentation

## 2021-09-02 DIAGNOSIS — R1031 Right lower quadrant pain: Secondary | ICD-10-CM | POA: Insufficient documentation

## 2021-09-02 DIAGNOSIS — S8991XA Unspecified injury of right lower leg, initial encounter: Secondary | ICD-10-CM | POA: Diagnosis not present

## 2021-09-02 DIAGNOSIS — X58XXXA Exposure to other specified factors, initial encounter: Secondary | ICD-10-CM | POA: Diagnosis not present

## 2021-09-02 DIAGNOSIS — R109 Unspecified abdominal pain: Secondary | ICD-10-CM | POA: Diagnosis not present

## 2021-09-02 DIAGNOSIS — Z90710 Acquired absence of both cervix and uterus: Secondary | ICD-10-CM | POA: Diagnosis not present

## 2021-09-02 LAB — COMPREHENSIVE METABOLIC PANEL
ALT: 16 U/L (ref 0–44)
AST: 18 U/L (ref 15–41)
Albumin: 3.9 g/dL (ref 3.5–5.0)
Alkaline Phosphatase: 61 U/L (ref 38–126)
Anion gap: 7 (ref 5–15)
BUN: 10 mg/dL (ref 6–20)
CO2: 28 mmol/L (ref 22–32)
Calcium: 9.2 mg/dL (ref 8.9–10.3)
Chloride: 103 mmol/L (ref 98–111)
Creatinine, Ser: 0.62 mg/dL (ref 0.44–1.00)
GFR, Estimated: >60 mL/min (ref >60)
Glucose, Bld: 98 mg/dL (ref 70–99)
Potassium: 3.5 mmol/L (ref 3.5–5.1)
Sodium: 138 mmol/L (ref 135–145)
Total Bilirubin: 0.6 mg/dL (ref 0.3–1.2)
Total Protein: 7.3 g/dL (ref 6.5–8.1)

## 2021-09-02 LAB — CBC WITH DIFFERENTIAL/PLATELET
Abs Immature Granulocytes: 0.03 10*3/uL (ref 0.00–0.07)
Basophils Absolute: 0.1 10*3/uL (ref 0.0–0.1)
Basophils Relative: 1 %
Eosinophils Absolute: 0.2 10*3/uL (ref 0.0–0.5)
Eosinophils Relative: 2 %
HCT: 43.5 % (ref 36.0–46.0)
Hemoglobin: 13.8 g/dL (ref 12.0–15.0)
Immature Granulocytes: 0 %
Lymphocytes Relative: 28 %
Lymphs Abs: 2.9 10*3/uL (ref 0.7–4.0)
MCH: 27.1 pg (ref 26.0–34.0)
MCHC: 31.7 g/dL (ref 30.0–36.0)
MCV: 85.3 fL (ref 80.0–100.0)
Monocytes Absolute: 0.6 10*3/uL (ref 0.1–1.0)
Monocytes Relative: 6 %
Neutro Abs: 6.7 10*3/uL (ref 1.7–7.7)
Neutrophils Relative %: 63 %
Platelets: 394 10*3/uL (ref 150–400)
RBC: 5.1 MIL/uL (ref 3.87–5.11)
RDW: 14.1 % (ref 11.5–15.5)
WBC: 10.6 10*3/uL — ABNORMAL HIGH (ref 4.0–10.5)
nRBC: 0 % (ref 0.0–0.2)

## 2021-09-02 LAB — URINALYSIS, ROUTINE W REFLEX MICROSCOPIC
Bacteria, UA: NONE SEEN
Bilirubin Urine: NEGATIVE
Glucose, UA: NEGATIVE mg/dL
Ketones, ur: NEGATIVE mg/dL
Leukocytes,Ua: NEGATIVE
Nitrite: NEGATIVE
Protein, ur: NEGATIVE mg/dL
Specific Gravity, Urine: 1.012 (ref 1.005–1.030)
pH: 5 (ref 5.0–8.0)

## 2021-09-02 NOTE — ED Triage Notes (Signed)
Patient coming from home, complaint of leg pain that started yesterday, back pain that started this morning. VSS. NAD.

## 2021-09-02 NOTE — ED Provider Notes (Signed)
Emergency Medicine Provider Triage Evaluation Note  Debbie [name] , a 50 y.o. female  was evaluated in triage.  Pt complains of right lower quadrant abdominal pain.  Pain started yesterday and has been progressively worsening.  Pain radiates to right flank and back.  Additionally patient endorses urinary urgency and frequency.  Patient states that 2 to 3 weeks prior she was treated for urinary tract infection.  Patient reports prior appendectomy.  Review of Systems  Positive: Right lower quadrant abdominal pain, right flank pain, urinary urgency, urinary frequency, subjective fevers, chills Negative: Numbness, weakness, nausea, vomiting, dysuria, hematuria  Physical Exam  BP 132/81 (BP Location: Right Arm)   Pulse 96   Temp 98.5 F (36.9 C)   Resp 16   LMP  (LMP Unknown) Comment: pt on a pill to stop menses  SpO2 100%  Gen:   Awake, no distress   Resp:  Normal effort  MSK:   Moves extremities without difficulty  Other:  Abdomen soft, nondistended, tenderness to right lower quadrant and right flank.  No guarding or rebound tenderness.  Negative CVA tenderness.  Medical Decision Making  Medically screening exam initiated at 3:48 PM.  Appropriate orders placed.  Debbie [name] was informed that the remainder of the evaluation will be completed by another provider, this initial triage assessment does not replace that evaluation, and the importance of remaining in the ED until their evaluation is complete.     Loni Beckwith, PA-C 09/02/21 1550    Hayden Rasmussen, MD 09/02/21 Vernelle Emerald

## 2021-09-02 NOTE — ED Provider Notes (Signed)
[place] EMERGENCY DEPARTMENT Provider Note   CSN: [id number] Arrival date & time: 09/02/21  1223     History Chief Complaint  Patient presents with   Back Pain   Leg Injury    Debbie [name] is a 50 y.o. female.  50 year old female presents today for evaluation of right lower quadrant abdominal pain, right flank pain, and right leg pain of 2-day duration.  Patient reports she had similar symptoms 2 weeks ago and was evaluated at her PCP office where she was treated for UTI with improvement in her symptoms.  She reports her symptoms had resolved until yesterday.  She denies fever, nausea, vomiting, difficulty urinating, or changes in her bowel habits.  She does report dysuria that started yesterday as well.  Denies other complaints at this time.  The history is provided by the patient. No language interpreter was used.      Past Medical History:  Diagnosis Date   Anemia    Dysmenorrhea    Headache(784.0)    Hypoglycemia    possible per pt- becomes shaky and weak when hungry    Patient Active Problem List   Diagnosis Date Noted   Abdominal mass 03/11/2019   Post-operative state 05/18/2015   Fibroid uterus 08/25/2013   Anemia 03/13/2012   DUB (dysfunctional uterine bleeding) 12/28/2011    Past Surgical History:  Procedure Laterality Date   CESAREAN SECTION     X2   ROBOTIC ASSISTED TOTAL HYSTERECTOMY WITH BILATERAL SALPINGO OOPHERECTOMY Right 05/18/2015   Procedure: ROBOTIC ASSISTED LAPAROSCOPIC  TOTAL HYSTERECTOMY WITH RIGHT SALPINGECTOMY, LYSIS OF ADHESIONS, CYSTOSCOPY;  Surgeon: Lavonia Drafts, MD;  Location: [facility];  Service: Gynecology;  Laterality: Right;   TUBAL LIGATION       OB History     Gravida  4   Para  4   Term  4   Preterm  0   AB  0   Living  4      SAB  0   IAB  0   Ectopic  0   Multiple  0   Live Births  4           Family History  Problem Relation Age of Onset   Hypertension Mother     Diabetes Mother    Hypertension Father    Diabetes Father    Breast cancer Sister    Cancer Paternal Aunt 78       BREAST CANCER   Cancer Paternal Grandfather        prostate   [name] cancer Cousin    Cancer Cousin        [name]   Anesthesia problems Neg Hx     Social History   Tobacco Use   Smoking status: Never   Smokeless tobacco: Never  Vaping Use   Vaping Use: Never used  Substance Use Topics   Alcohol use: Yes    Comment: occ   Drug use: No    Home Medications Prior to Admission medications   Medication Sig Start Date End Date Taking? Authorizing Provider  escitalopram (LEXAPRO) 10 MG tablet Take 10 mg by mouth daily.    [provider]  hydrochlorothiazide (HYDRODIURIL) 12.5 MG tablet Take 1 tablet (12.5 mg total) by mouth daily. 06/22/21 09/20/21  Allwardt, Randa Evens, PA-C    Allergies    Penicillins  Review of Systems   Review of Systems  Constitutional:  Negative for activity change, chills and fever.  Respiratory:  Negative  for shortness of breath.   Cardiovascular:  Negative for chest pain.  Gastrointestinal:  Positive for abdominal pain. Negative for constipation, nausea and vomiting.  Genitourinary:  Positive for flank pain.  Musculoskeletal:  Negative for gait problem.  Neurological:  Negative for light-headedness and headaches.  All other systems reviewed and are negative.  Physical Exam Updated Vital Signs BP 113/79   Pulse 75   Temp 98.9 F (37.2 C)   Resp 20   LMP  (LMP Unknown) Comment: pt on a pill to stop menses  SpO2 99%   Physical Exam Vitals and nursing note reviewed.  Constitutional:      General: She is not in acute distress.    Appearance: Normal appearance. She is not ill-appearing.  HENT:     Head: Normocephalic and atraumatic.     Nose: Nose normal.  Eyes:     General: No scleral icterus.    Extraocular Movements: Extraocular movements intact.     Conjunctiva/sclera: Conjunctivae normal.   Cardiovascular:     Rate and Rhythm: Normal rate and regular rhythm.     Pulses: Normal pulses.     Heart sounds: Normal heart sounds.  Pulmonary:     Effort: Pulmonary effort is normal. No respiratory distress.     Breath sounds: Normal breath sounds. No wheezing or rales.  Abdominal:     General: There is no distension.     Tenderness: There is abdominal tenderness (RLQ). There is no right CVA tenderness, left CVA tenderness or guarding.  Musculoskeletal:        General: Normal range of motion.     Cervical back: Normal range of motion.  Skin:    General: Skin is warm and dry.  Neurological:     General: No focal deficit present.     Mental Status: She is alert. Mental status is at baseline.    ED Results / Procedures / Treatments   Labs (all labs ordered are listed, but only abnormal results are displayed) Labs Reviewed  CBC WITH DIFFERENTIAL/PLATELET - Abnormal; Notable for the following components:      Result Value   WBC 10.6 (*)    All other components within normal limits  URINALYSIS, ROUTINE W REFLEX MICROSCOPIC - Abnormal; Notable for the following components:   Hgb urine dipstick SMALL (*)    All other components within normal limits  URINE CULTURE  COMPREHENSIVE METABOLIC PANEL    EKG None  Radiology CT Renal Stone Study  Result Date: 09/02/2021 CLINICAL DATA:  Flank pain. EXAM: CT ABDOMEN AND PELVIS WITHOUT CONTRAST TECHNIQUE: Multidetector CT imaging of the abdomen and pelvis was performed following the standard protocol without IV contrast. COMPARISON:  None. FINDINGS: Lower chest: No acute abnormality. Hepatobiliary: No focal liver abnormality is seen. No gallstones, gallbladder wall thickening, or biliary dilatation. Pancreas: Unremarkable. No pancreatic ductal dilatation or surrounding inflammatory changes. Spleen: Normal in size without focal abnormality. Adrenals/Urinary Tract: Adrenal glands are unremarkable. Kidneys are normal, without renal calculi,  focal lesion, or hydronephrosis. Bladder is unremarkable. Stomach/Bowel: Stomach is within normal limits. Appendix appears normal. No evidence of bowel wall thickening, distention, or inflammatory changes. Vascular/Lymphatic: No significant vascular findings are present. No enlarged abdominal or pelvic lymph nodes. Reproductive: Status post hysterectomy. No adnexal masses. Other: No abdominal wall hernia or abnormality. No abdominopelvic ascites. Musculoskeletal: No acute or significant osseous findings. IMPRESSION: No CT evidence of acute intra-abdominal pathology. Electronically Signed   By: Virgina Norfolk M.D.   On: 09/02/2021 17:50  Procedures Procedures   Medications Ordered in ED Medications - No data to display  ED Course  I have reviewed the triage vital signs and the nursing notes.  Pertinent labs & imaging results that were available during my care of the patient were reviewed by me and considered in my medical decision making (see chart for details).    MDM Rules/Calculators/A&P                           50 year old female presents today for evaluation of right lower quadrant abdominal pain, right flank pain, and right lower extremity pain.  Patient has similar symptoms which was previously treated with antibiotics for UTI with resolution of her symptoms.  Her UA at that time only had hemoglobin without nitrites or leukocytes and urine culture was negative for UTI.  Her UA at this time is consistent with her previously with only being positive for hemoglobin.  Urine culture pending.  CBC with mild leukocytosis without signs or symptoms of infection likely reactive.  CT renal study without acute intra-abdominal process.  Discussed these findings with patient.  We will hold off on treating her empirically for UTI until urine culture returns.  Return precautions discussed at length.  Discussed importance of follow-up with PCP as soon as she is able.  Patient and husband both voiced  understanding and are in agreement with plan.  Final Clinical Impression(s) / ED Diagnoses Final diagnoses:  Right lower quadrant abdominal pain    Rx / DC Orders ED Discharge Orders     None        Evlyn Courier, PA-C 09/02/21 2322    Davonna Belling, MD 09/03/21 [phone]

## 2021-09-02 NOTE — Discharge Instructions (Addendum)
Your blood work today was reassuring.  Your white count was a little elevated but without other signs of infection this could be reactive.  Your CT scan was normal.  Your urine was without any signs of UTI.  You did have a little blood in your urinalysis consistent with 2 weeks ago.  Your urine culture at that time was negative for infection.  We will repeat a urine culture at this time.  If your symptoms worsen, you develop fever, worsening abdominal pain, or nausea vomiting to the point you are unable to keep any food or drink down please return to the emergency room.

## 2021-09-03 LAB — URINE CULTURE: Culture: NO GROWTH

## 2021-09-07 ENCOUNTER — Ambulatory Visit: Payer: BC Managed Care – PPO | Admitting: Physician Assistant

## 2021-09-07 ENCOUNTER — Encounter: Payer: Self-pay | Admitting: Physician Assistant

## 2021-09-07 ENCOUNTER — Other Ambulatory Visit: Payer: Self-pay

## 2021-09-07 VITALS — BP 139/88 | HR 78 | Temp 98.0°F | Ht 61.0 in | Wt 173.2 lb

## 2021-09-07 DIAGNOSIS — N80129 Deep endometriosis of ovary, unspecified ovary: Secondary | ICD-10-CM | POA: Diagnosis not present

## 2021-09-07 DIAGNOSIS — R1903 Right lower quadrant abdominal swelling, mass and lump: Secondary | ICD-10-CM

## 2021-09-07 DIAGNOSIS — F411 Generalized anxiety disorder: Secondary | ICD-10-CM | POA: Diagnosis not present

## 2021-09-07 MED ORDER — ALPRAZOLAM 0.25 MG PO TABS: 0.2500 mg | ORAL_TABLET | Freq: Two times a day (BID) | ORAL | 0 refills | Status: DC | PRN

## 2021-09-07 NOTE — Progress Notes (Signed)
Subjective:    Patient ID: Debbie [name], female    DOB: 30-Aug-1971, 50 y.o.   MRN: [id number]  Chief Complaint  Patient presents with   Abdominal Pain   Stress    HPI Patient is in today for hospital f/up.  Pt went to the ED on 09/02/21 for right lower quadrant pain and flank pain.  She had a CT of the abdomen and pelvis without IV contrast and this was found to be negative.  She was discharged in stable condition and told to follow-up with PCP.  Patient states that this is also confusing to her that her scan was negative, as she has been followed by [facility] for a soft tissue abdominal mass.  Supposed to have surgical excision on 10/25/2021 with Dr. Alvan Dame, but obviously she does not want to go through with this if the mass is no longer seen.   Patient states that she has also been under a large amount of stress lately.  Father has stage four [name] cancer and she is trying to figure out how to travel back home to see him.  She is very tearful when discussing this.  She says she has taken low-dose Xanax in the past for stress and this was helpful to use only as needed.  She is currently taking Lexapro 10 mg.  Past Medical History:  Diagnosis Date   Anemia    Dysmenorrhea    Headache(784.0)    Hypoglycemia    possible per pt- becomes shaky and weak when hungry    Past Surgical History:  Procedure Laterality Date   CESAREAN SECTION     X2   ROBOTIC ASSISTED TOTAL HYSTERECTOMY WITH BILATERAL SALPINGO OOPHERECTOMY Right 05/18/2015   Procedure: ROBOTIC ASSISTED LAPAROSCOPIC  TOTAL HYSTERECTOMY WITH RIGHT SALPINGECTOMY, LYSIS OF ADHESIONS, CYSTOSCOPY;  Surgeon: Lavonia Drafts, MD;  Location: [facility];  Service: Gynecology;  Laterality: Right;   TUBAL LIGATION      Family History  Problem Relation Age of Onset   Hypertension Mother    Diabetes Mother    Hypertension Father    Diabetes Father    Breast cancer Sister    Cancer Paternal Aunt 86        BREAST CANCER   Cancer Paternal Grandfather        prostate   [name] cancer Cousin    Cancer Cousin        [name]   Anesthesia problems Neg Hx     Social History   Tobacco Use   Smoking status: Never   Smokeless tobacco: Never  Vaping Use   Vaping Use: Never used  Substance Use Topics   Alcohol use: Yes    Comment: occ   Drug use: No     Allergies  Allergen Reactions   Penicillins Other (See Comments)    Not sure if she has true allergy to Penicillin but her father told her that she or one of her sisters had a reaction.    Review of Systems NEGATIVE UNLESS OTHERWISE INDICATED IN HPI      Objective:     BP 139/88   Pulse 78   Temp 98 F (36.7 C)   Ht 5\' 1"  (1.549 m)   Wt 173 lb 3.2 oz (78.6 kg)   LMP  (LMP Unknown) Comment: pt on a pill to stop menses  SpO2 98%   BMI 32.73 kg/m   Wt Readings from Last 3 Encounters:  09/07/21 173 lb 3.2 oz (  78.6 kg)  08/09/21 175 lb 3.2 oz (79.5 kg)  07/28/21 172 lb 6.4 oz (78.2 kg)    BP Readings from Last 3 Encounters:  09/07/21 139/88  09/02/21 113/79  08/09/21 131/84     Physical Exam Vitals and nursing note reviewed.  Constitutional:      Appearance: Normal appearance. She is normal weight. She is not toxic-appearing.  HENT:     Head: Normocephalic and atraumatic.     Right Ear: Tympanic membrane, ear canal and external ear normal.     Left Ear: Tympanic membrane, ear canal and external ear normal.     Nose: Nose normal.     Mouth/Throat:     Mouth: Mucous membranes are moist.  Eyes:     Extraocular Movements: Extraocular movements intact.     Conjunctiva/sclera: Conjunctivae normal.     Pupils: Pupils are equal, round, and reactive to light.  Cardiovascular:     Rate and Rhythm: Normal rate and regular rhythm.     Pulses: Normal pulses.     Heart sounds: Normal heart sounds.  Pulmonary:     Effort: Pulmonary effort is normal.     Breath sounds: Normal breath sounds.  Abdominal:      General: Abdomen is flat. Bowel sounds are normal.     Palpations: Abdomen is soft.     Tenderness: There is no abdominal tenderness. There is no right CVA tenderness, left CVA tenderness or guarding.     Hernia: No hernia is present.  Musculoskeletal:        General: Normal range of motion.     Cervical back: Normal range of motion and neck supple.  Skin:    General: Skin is warm and dry.  Neurological:     General: No focal deficit present.     Mental Status: She is alert and oriented to person, place, and time.  Psychiatric:        Behavior: Behavior normal.        Thought Content: Thought content normal.        Judgment: Judgment normal.     Comments: TEARFUL       Assessment & Plan:   Problem List Items Addressed This Visit       Other   Abdominal mass - Primary   Relevant Orders   US PELVIC COMPLETE WITH TRANSVAGINAL   Other Visit Diagnoses     Endometrioma       Relevant Orders   US PELVIC COMPLETE WITH TRANSVAGINAL        Meds ordered this encounter  Medications   ALPRAZolam (XANAX) 0.25 MG tablet    Sig: Take 1 tablet (0.25 mg total) by mouth 2 (two) times daily as needed for anxiety.    Dispense:  30 tablet    Refill:  0    1. Right lower quadrant abdominal mass 2. Endometrioma I personally reviewed the patient's ED report as well as imaging studies and went over these in detail with her.  I also reviewed her prior CT scan from last year, which showed: CT ABDOMEN PELVIS W CONTRAST (ROUTINE), 07/29/2020  1. Overall similar size of the 1.9 x 3.2 x 3.8 cm soft tissue lesion along the ventral aspect of the right lower abdominal/pelvic wall. 2. No acute findings to suggest the etiology for patient's acute abdominal pain.    I discussed several options with the patient, and she agreed to further imaging including a pelvic ultrasound to see if this mass could be visible  on this study.  I will also reach out to radiologist to read her most recent CT for a  second look for this mass that [facility] has been following.  3. Generalized anxiety disorder -She is very distressed over the health of her father -Lexapro 10 mg daily, will continue this. -PDMP reviewed today, no red flags; agreed to Rx low dose 0.25 mg Xanax to take up to twice daily prn acute anxiety.  She is aware of potential risk vs benefit of this type of medication. Side effects possible discussed with her. -She will f/up with me in the next 4-6 weeks.    This note was prepared with assistance of Systems analyst. Occasional wrong-word or sound-a-like substitutions may have occurred due to the inherent limitations of voice recognition software.  Icess Bertoni M Camdon Saetern, PA-C

## 2021-09-11 ENCOUNTER — Encounter: Payer: Self-pay | Admitting: Physician Assistant

## 2021-09-30 ENCOUNTER — Other Ambulatory Visit: Payer: Self-pay

## 2021-09-30 DIAGNOSIS — R1031 Right lower quadrant pain: Secondary | ICD-10-CM

## 2021-10-19 ENCOUNTER — Other Ambulatory Visit: Payer: BC Managed Care – PPO

## 2021-10-19 ENCOUNTER — Other Ambulatory Visit: Payer: Self-pay | Admitting: Physician Assistant

## 2021-10-19 ENCOUNTER — Ambulatory Visit
Admission: RE | Admit: 2021-10-19 | Discharge: 2021-10-19 | Disposition: A | Discharge status: Home / Self Care | Payer: BC Managed Care – PPO | Source: Ambulatory Visit | Attending: Physician Assistant | Admitting: Physician Assistant

## 2021-10-19 DIAGNOSIS — R1031 Right lower quadrant pain: Secondary | ICD-10-CM

## 2021-10-19 DIAGNOSIS — R1900 Intra-abdominal and pelvic swelling, mass and lump, unspecified site: Secondary | ICD-10-CM | POA: Diagnosis not present

## 2021-10-25 DIAGNOSIS — N80129 Deep endometriosis of ovary, unspecified ovary: Secondary | ICD-10-CM | POA: Diagnosis not present

## 2021-10-25 DIAGNOSIS — D481 Neoplasm of uncertain behavior of connective and other soft tissue: Secondary | ICD-10-CM | POA: Diagnosis not present

## 2021-10-25 DIAGNOSIS — R1900 Intra-abdominal and pelvic swelling, mass and lump, unspecified site: Secondary | ICD-10-CM | POA: Diagnosis not present

## 2021-10-25 DIAGNOSIS — G8918 Other acute postprocedural pain: Secondary | ICD-10-CM | POA: Diagnosis not present

## 2021-10-26 DIAGNOSIS — D481 Neoplasm of uncertain behavior of connective and other soft tissue: Secondary | ICD-10-CM | POA: Diagnosis not present

## 2021-11-18 DIAGNOSIS — U071 COVID-19: Secondary | ICD-10-CM | POA: Diagnosis not present

## 2021-12-13 ENCOUNTER — Other Ambulatory Visit: Payer: Self-pay | Admitting: Physician Assistant

## 2021-12-13 DIAGNOSIS — Z1231 Encounter for screening mammogram for malignant neoplasm of breast: Secondary | ICD-10-CM

## 2021-12-16 ENCOUNTER — Ambulatory Visit
Admission: RE | Admit: 2021-12-16 | Discharge: 2021-12-16 | Disposition: A | Discharge status: Home / Self Care | Payer: BC Managed Care – PPO | Source: Ambulatory Visit | Attending: Physician Assistant | Admitting: Physician Assistant

## 2021-12-16 ENCOUNTER — Other Ambulatory Visit: Payer: Self-pay

## 2021-12-16 DIAGNOSIS — Z1231 Encounter for screening mammogram for malignant neoplasm of breast: Secondary | ICD-10-CM

## 2022-02-15 ENCOUNTER — Ambulatory Visit (INDEPENDENT_AMBULATORY_CARE_PROVIDER_SITE_OTHER): Payer: BC Managed Care – PPO | Admitting: Gastroenterology

## 2022-02-15 ENCOUNTER — Encounter: Payer: Self-pay | Admitting: Gastroenterology

## 2022-02-15 ENCOUNTER — Ambulatory Visit (INDEPENDENT_AMBULATORY_CARE_PROVIDER_SITE_OTHER): Payer: BC Managed Care – PPO | Admitting: Physician Assistant

## 2022-02-15 ENCOUNTER — Encounter: Payer: Self-pay | Admitting: Physician Assistant

## 2022-02-15 VITALS — BP 120/70 | HR 100 | Ht 61.0 in | Wt 171.0 lb

## 2022-02-15 VITALS — BP 118/80 | HR 87 | Temp 98.1°F | Ht 61.0 in | Wt 171.5 lb

## 2022-02-15 DIAGNOSIS — Z1211 Encounter for screening for malignant neoplasm of colon: Secondary | ICD-10-CM

## 2022-02-15 DIAGNOSIS — R109 Unspecified abdominal pain: Secondary | ICD-10-CM

## 2022-02-15 DIAGNOSIS — J029 Acute pharyngitis, unspecified: Secondary | ICD-10-CM

## 2022-02-15 DIAGNOSIS — K5900 Constipation, unspecified: Secondary | ICD-10-CM | POA: Diagnosis not present

## 2022-02-15 LAB — POCT RAPID STREP A (OFFICE): Rapid Strep A Screen: POSITIVE — AB

## 2022-02-15 MED ORDER — NA SULFATE-K SULFATE-MG SULF 17.5-3.13-1.6 GM/177ML PO SOLN: 1.0000 | Freq: Once | ORAL | 0 refills | Status: AC

## 2022-02-15 MED ORDER — AZITHROMYCIN 250 MG PO TABS: ORAL_TABLET | ORAL | 0 refills | Status: AC

## 2022-02-15 NOTE — Progress Notes (Signed)
? ?Referring Provider: Allwardt, Randa Evens, PA-C ?Primary Care Physician:  Allwardt, Randa Evens, PA-C ? ?Chief complaint: Debbie Debbie screening ? ? 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   - no prior Debbie Debbie screening ?Chronic constipation ?   - Lifetime history of constipation with bowel movement weekly ?   - Associated abdominal pain and bloating as symptoms progress ?   - symptoms improved following removal of endometrioma ?Family history of Debbie Debbie (father in his 84s) ? ? ? ?PLAN: ?- Colonoscopy with 2 day bowel prep  ?- Consider trial of Miralax 17 grams daily for constipation in the meantime ? ?I consented the patient discussing the risks, benefits, and alternatives to endoscopic evaluation. In particular, we discussed the risks that include, but are not limited to, reaction to medication, cardiopulmonary compromise, bleeding requiring blood transfusion, aspiration resulting in pneumonia, perforation requiring surgery, lack of diagnosis, severe illness requiring hospitalization, and even death. The patient acknowledges these risks and asks that we proceed.  ? ? ?HPI: Debbie Debbie is a 51 y.o. female last seen 07/28/2021 when she was referred for Debbie Debbie screening. Office visit was scheduled prior to colonoscopy given her complaints about chronic constipation and ongoing abdominal pain. ? ?Evaluated at [facility] in 2021 after a 1.9 x 3.2 x 3.8cm right lower quadrant mass was found on CT scan.  Colonoscopy deferred until she had the right rectus sheath tumor removed 11/03/2021 at [facility].  Pathology indicated an endometrioma.  Her postoperative course was uncomplicated.   ? ?She returns today hoping to proceed with colonoscopy.  ? ?Had chronic constipation with associated right lower abdominal pain and bloating between bowel movements that improved with surgery. ? ?No prior Debbie Debbie screening or endoscopy. ? ?Father with  Debbie Debbie at age 72. Cousin with Debbie Debbie. Sister with breast [name]. No other family history of Debbie Debbie or polyps.  ? ?Normal calcium and CBC 03/2021 ? ?Prior abdominal imaging: ?CT of the abdomen pelvis 07/04/2020 showed no acute findings for abdominal pain, previously biopsied mass involving the ventral aspect of the right lower abdominal/pelvic wall was unchanged compared to 2020 again measuring 4.6 cm. ? ?Past Medical History:  ?Diagnosis Date  ? Anemia   ? Dysmenorrhea   ? Headache(784.0)   ? Hypoglycemia   ? possible per pt- becomes shaky and weak when hungry  ? ? ?Past Surgical History:  ?Procedure Laterality Date  ? CESAREAN SECTION    ? X2  ? ROBOTIC ASSISTED TOTAL HYSTERECTOMY WITH BILATERAL SALPINGO OOPHERECTOMY Right 05/18/2015  ? Procedure: ROBOTIC ASSISTED LAPAROSCOPIC  TOTAL HYSTERECTOMY WITH RIGHT SALPINGECTOMY, LYSIS OF ADHESIONS, CYSTOSCOPY;  Surgeon: Lavonia Drafts, MD;  Location: [facility];  Service: Gynecology;  Laterality: Right;  ? TUBAL LIGATION    ? ? ? ?Current Outpatient Medications  ?Medication Sig Dispense Refill  ? ALPRAZolam (XANAX) 0.25 MG tablet Take 1 tablet (0.25 mg total) by mouth 2 (two) times daily as needed for anxiety. 30 tablet 0  ? escitalopram (LEXAPRO) 10 MG tablet Take 10 mg by mouth daily.    ? hydrochlorothiazide (HYDRODIURIL) 12.5 MG tablet Take 1 tablet (12.5 mg total) by mouth daily. 90 tablet 1  ? ?No current facility-administered medications for this visit.  ? ? ?Allergies as of 02/15/2022 - Review Complete 02/15/2022  ?Allergen Reaction Noted  ? Penicillins Other (See Comments) 05/04/2015  ? ? ?Family History  ?Problem Relation Age of Onset  ? Hypertension Mother   ?  Diabetes Mother   ? Hypertension Father   ? Diabetes Father   ? Breast [name] Sister   ? [name] Paternal Aunt 68  ?     BREAST [name]  ? [name] Paternal Grandfather   ?     prostate  ? Debbie Debbie Cousin   ? [name] Cousin   ?     Debbie.Marland Kitchen PATERNAL SIDE  ? Anesthesia problems Neg Hx    ? ? ? ? ? ?Physical Exam: ?General:   Alert,  well-nourished, pleasant and cooperative in NAD ?Head:  Normocephalic and atraumatic. ?Eyes:  Sclera clear, no icterus.   Conjunctiva pink. ?Ears:  Normal auditory acuity. ?Nose:  No deformity, discharge,  or lesions. ?Mouth:  No deformity or lesions.   ?Neck:  Supple; no masses or thyromegaly. ?Lungs:  Clear throughout to auscultation.   No wheezes. ?Heart:  Regular rate and rhythm; no murmurs. ?Abdomen:  Soft, nontender, nondistended, normal bowel sounds, no rebound or guarding. No obvious mass. No hepatosplenomegaly.   ?Rectal:  Deferred  ?Msk:  Symmetrical. No boney deformities ?LAD: No inguinal or umbilical LAD ?Extremities:  No clubbing or edema. ?Neurologic:  Alert and  oriented x4;  grossly nonfocal ?Skin:  Intact without significant lesions or rashes. ?Psych:  Alert and cooperative. Normal mood and affect. ? ? ? ? ?Debbie Debbie L. Tarri Glenn, MD, MPH ?02/15/2022, 2:59 PM ? ? ? ?  ?

## 2022-02-15 NOTE — Patient Instructions (Signed)
It was my pleasure to provide care to you today. Based on our discussion, I am providing you with my recommendations below: ? ?RECOMMENDATION(S):  ? ?COLONOSCOPY:  ? ?You have been scheduled for a colonoscopy. Please follow written instructions given to you at your visit today.  ? ?PREP:  ? ?Please pick up your prep supplies at the pharmacy within the next 1-3 days. ? ?INHALERS:  ? ?If you use inhalers (even only as needed), please bring them with you on the day of your procedure. ? ?COLONOSCOPY TIPS: ? ?To reduce nausea and dehydration, stay well hydrated for 3-4 days prior to the exam.  ?To prevent skin/hemorrhoid irritation - prior to wiping, put A&Dointment or vaseline on the toilet paper. ?Keep a towel or pad on the bed.  ?BEFORE STARTING YOUR PREP, drink  64oz of clear liquids in the morning. This will help to flush the colon and will ensure you are well hydrated!!!!  ?NOTE - This is in addition to the fluids required for to complete your prep. ?Use of a flavored hard candy, such as grape Anise Salvo, can counteract some of the flavor of the prep and may prevent some nausea.  ? ? ?FOLLOW UP: ? ?After your procedure, you will receive a call from my office staff regarding my recommendation for follow up. ? ?BMI: ? ?If you are age 73 or older, your body mass index should be between 23-30. Your Body mass index is 32.31 kg/m?Marland Kitchen If this is out of the aforementioned range listed, please consider follow up with your Primary Care Provider. ? ?If you are age 75 or younger, your body mass index should be between 19-25. Your Body mass index is 32.31 kg/m?Marland Kitchen If this is out of the aformentioned range listed, please consider follow up with your Primary Care Provider.  ? ?MY CHART: ? ?The [facility] providers would like to encourage you to use [facility] to communicate with providers for non-urgent requests or questions.  Due to long hold times on the telephone, sending your provider a message by [facility] may be a faster and more  efficient way to get a response.  Please allow 48 business hours for a response.  Please remember that this is for non-urgent requests.  ? ?Thank you for trusting me with your gastrointestinal care!   ? ?Thornton Park, MD, MPH ? ?

## 2022-02-15 NOTE — Progress Notes (Signed)
Debbie [name] is a 51 y.o. female here for a follow up of a pre-existing problem. ? ?History of Present Illness:  ? ?Chief Complaint  ?Patient presents with  ? Sore Throat  ?  Started on Monday with sore throat, headache and slight cough. Also some ear pain. COVID test Neg today.  ? ? ?HPI ? ?Sore throat ?Monday afternoon had sore throat. Tuesday developed worsening throat, HA, ear pain. She has a sick granddaughter at home with a virus. Patient is taking Dayquil and Nyquil with mild relief. Denies: fever, chills, n/v/d, neck stiffness. ? ?Past Medical History:  ?Diagnosis Date  ? Anemia   ? Dysmenorrhea   ? Headache(784.0)   ? Hypoglycemia   ? possible per pt- becomes shaky and weak when hungry  ? ?  ?Social History  ? ?Tobacco Use  ? Smoking status: Never  ? Smokeless tobacco: Never  ?Vaping Use  ? Vaping Use: Never used  ?Substance Use Topics  ? Alcohol use: Yes  ?  Comment: occ  ? Drug use: No  ? ? ?Past Surgical History:  ?Procedure Laterality Date  ? CESAREAN SECTION    ? X2  ? ROBOTIC ASSISTED TOTAL HYSTERECTOMY WITH BILATERAL SALPINGO OOPHERECTOMY Right 05/18/2015  ? Procedure: ROBOTIC ASSISTED LAPAROSCOPIC  TOTAL HYSTERECTOMY WITH RIGHT SALPINGECTOMY, LYSIS OF ADHESIONS, CYSTOSCOPY;  Surgeon: Lavonia Drafts, MD;  Location: [facility];  Service: Gynecology;  Laterality: Right;  ? TUBAL LIGATION    ? ? ?Family History  ?Problem Relation Age of Onset  ? Hypertension Mother   ? Diabetes Mother   ? Hypertension Father   ? Diabetes Father   ? Breast cancer Sister   ? Cancer Paternal Aunt 64  ?     BREAST CANCER  ? Cancer Paternal Grandfather   ?     prostate  ? [name] cancer Cousin   ? Cancer Cousin   ?     [name]  ? Anesthesia problems Neg Hx   ? ? ?Allergies  ?Allergen Reactions  ? Penicillins Other (See Comments)  ?  Not sure if she has true allergy to Penicillin but her father told her that she or one of her sisters had a reaction.  ? ? ?Current Medications:  ? ?Current Outpatient  Medications:  ?  Na Sulfate-K Sulfate-Mg Sulf 17.5-3.13-1.6 GM/177ML SOLN, Take 1 kit by mouth once for 1 dose. (Patient not taking: Reported on 02/15/2022), Disp: 354 mL, Rfl: 0  ? ?Review of Systems:  ? ?ROS ?Negative unless otherwise specified per HPI. ? ? ?Vitals:  ? ?Vitals:  ? 02/15/22 1558  ?BP: 118/80  ?Pulse: 87  ?Temp: 98.1 ?F (36.7 ?C)  ?TempSrc: Temporal  ?SpO2: 100%  ?Weight: 171 lb 8 oz (77.8 kg)  ?Height: _0  (1.549 m)  ?   ?Body mass index is 32.4 kg/m?. ? ?Physical Exam:  ? ?Physical Exam ?Vitals and nursing note reviewed.  ?Constitutional:   ?   General: She is not in acute distress. ?   Appearance: She is well-developed. She is not ill-appearing or toxic-appearing.  ?HENT:  ?   Head: Normocephalic and atraumatic.  ?   Right Ear: Tympanic membrane, ear canal and external ear normal. Tympanic membrane is not erythematous, retracted or bulging.  ?   Left Ear: Tympanic membrane, ear canal and external ear normal. Tympanic membrane is not erythematous, retracted or bulging.  ?   Nose: Nose normal.  ?   Right Sinus: No maxillary sinus tenderness or frontal sinus tenderness.  ?  Left Sinus: No maxillary sinus tenderness or frontal sinus tenderness.  ?   Mouth/Throat:  ?   Pharynx: Uvula midline. Posterior oropharyngeal erythema present.  ?   Tonsils: 1+ on the right. 1+ on the left.  ?Eyes:  ?   General: Lids are normal.  ?   Conjunctiva/sclera: Conjunctivae normal.  ?Neck:  ?   Trachea: Trachea normal.  ?Cardiovascular:  ?   Rate and Rhythm: Normal rate and regular rhythm.  ?   Heart sounds: Normal heart sounds, S1 normal and S2 normal.  ?Pulmonary:  ?   Effort: Pulmonary effort is normal.  ?   Breath sounds: Normal breath sounds. No decreased breath sounds, wheezing, rhonchi or rales.  ?Lymphadenopathy:  ?   Cervical: No cervical adenopathy.  ?Skin: ?   General: Skin is warm and dry.  ?Neurological:  ?   Mental Status: She is alert.  ?Psychiatric:     ?   Speech: Speech normal.     ?   Behavior:  Behavior normal. Behavior is cooperative.  ? ?Results for orders placed or performed in visit on 02/15/22  ?POCT rapid strep A  ?Result Value Ref Range  ? Rapid Strep A Screen Positive (A) Negative  ? ? ?Assessment and Plan:  ? ?Sore throat ?Strep test positive ?No red flags ?Start azithromycin abx ?Rest, fluids ?Supportive care discussed ?Follow-up as needed or if worsening ? ?Inda Coke, PA-C ?

## 2022-02-15 NOTE — Patient Instructions (Signed)
It was great to see you! ? ?Start azithromycin antibiotic for your strep throat ? ?If you do not feel like you are improving, let us know ? ?Take care, ? ?Inda Coke PA-C  ?

## 2022-02-17 ENCOUNTER — Ambulatory Visit: Payer: BC Managed Care – PPO | Admitting: Physician Assistant

## 2022-02-21 ENCOUNTER — Telehealth: Payer: Self-pay | Admitting: Physician Assistant

## 2022-02-21 ENCOUNTER — Encounter: Payer: Self-pay | Admitting: Physician Assistant

## 2022-02-21 ENCOUNTER — Ambulatory Visit: Payer: BC Managed Care – PPO | Admitting: Physician Assistant

## 2022-02-21 ENCOUNTER — Other Ambulatory Visit: Payer: Self-pay

## 2022-02-21 VITALS — BP 130/85 | HR 71 | Temp 98.5°F | Ht 61.0 in | Wt 170.6 lb

## 2022-02-21 DIAGNOSIS — R051 Acute cough: Secondary | ICD-10-CM | POA: Diagnosis not present

## 2022-02-21 MED ORDER — BENZONATATE 100 MG PO CAPS: 100.0000 mg | ORAL_CAPSULE | Freq: Three times a day (TID) | ORAL | 0 refills | Status: DC | PRN

## 2022-02-21 MED ORDER — PREDNISONE 20 MG PO TABS: 20.0000 mg | ORAL_TABLET | Freq: Two times a day (BID) | ORAL | 0 refills | Status: AC

## 2022-02-21 MED ORDER — ALBUTEROL SULFATE (2.5 MG/3ML) 0.083% IN NEBU: 2.5000 mg | INHALATION_SOLUTION | Freq: Four times a day (QID) | RESPIRATORY_TRACT | 0 refills | Status: DC | PRN

## 2022-02-21 MED ORDER — ALBUTEROL SULFATE HFA 108 (90 BASE) MCG/ACT IN AERS: 2.0000 | INHALATION_SPRAY | Freq: Four times a day (QID) | RESPIRATORY_TRACT | 0 refills | Status: DC | PRN

## 2022-02-21 NOTE — Progress Notes (Signed)
? ?Subjective:  ? ? Patient ID: Debbie [name], female    DOB: [birth year], 51 y.o.   MRN: [id number] ? ?Chief Complaint  ?Patient presents with  ? Cough  ?  Started last Thursday. She has taken OTC Dayquil and Nyquil.   ? Shortness of Breath  ?  With coughing.  ? ? ?Cough ?Associated symptoms include shortness of breath.  ?Shortness of Breath ? ?Patient is in today for the following: ? ?Chief complaint: Cough and pressure over chest with cough, some feelings of sob ?Symptom onset: 02/16/22 ?Pertinent positives: Recent strep throat, treated with Z-pak on 02/15/22 ?Pertinent negatives: Fever, chills, HA, body aches  ?Treatments tried: Nyquil and Dayquil  ?Sick exposure: Daughter with similar symptoms  ? ?Hx asthma "a long time ago"  ? ? ?Past Medical History:  ?Diagnosis Date  ? Anemia   ? Dysmenorrhea   ? Headache(784.0)   ? Hypoglycemia   ? possible per pt- becomes shaky and weak when hungry  ? ? ?Past Surgical History:  ?Procedure Laterality Date  ? CESAREAN SECTION    ? X2  ? ROBOTIC ASSISTED TOTAL HYSTERECTOMY WITH BILATERAL SALPINGO OOPHERECTOMY Right 05/18/2015  ? Procedure: ROBOTIC ASSISTED LAPAROSCOPIC  TOTAL HYSTERECTOMY WITH RIGHT SALPINGECTOMY, LYSIS OF ADHESIONS, CYSTOSCOPY;  Surgeon: Lavonia Drafts, MD;  Location: [facility];  Service: Gynecology;  Laterality: Right;  ? TUBAL LIGATION    ? ? ?Family History  ?Problem Relation Age of Onset  ? Hypertension Mother   ? Diabetes Mother   ? Hypertension Father   ? Diabetes Father   ? Breast cancer Sister   ? Cancer Paternal Aunt 48  ?     BREAST CANCER  ? Cancer Paternal Grandfather   ?     prostate  ? [name] cancer Cousin   ? Cancer Cousin   ?     [name]  ? Anesthesia problems Neg Hx   ? ? ?Social History  ? ?Tobacco Use  ? Smoking status: Never  ? Smokeless tobacco: Never  ?Vaping Use  ? Vaping Use: Never used  ?Substance Use Topics  ? Alcohol use: Yes  ?  Comment: occ  ? Drug use: No  ?  ? ?Allergies  ?Allergen Reactions  ? Penicillins Other  (See Comments)  ?  Not sure if she has true allergy to Penicillin but her father told her that she or one of her sisters had a reaction.  ? ? ?Review of Systems  ?Respiratory:  Positive for cough and shortness of breath.   ?NEGATIVE UNLESS OTHERWISE INDICATED IN HPI ? ? ?   ?Objective:  ?  ? ?BP 130/85 (BP Location: Left Arm, Patient Position: Sitting, Cuff Size: Large)   Pulse 71   Temp 98.5 ?F (36.9 ?C) (Temporal)   Ht '5\' 1"'$  (1.549 m)   Wt 170 lb 9.6 oz (77.4 kg)   LMP  (LMP Unknown) Comment: pt on a pill to stop menses  SpO2 100%   BMI 32.23 kg/m?  ? ?Wt Readings from Last 3 Encounters:  ?02/21/22 170 lb 9.6 oz (77.4 kg)  ?02/15/22 171 lb 8 oz (77.8 kg)  ?02/15/22 171 lb (77.6 kg)  ? ? ?BP Readings from Last 3 Encounters:  ?02/21/22 130/85  ?02/15/22 118/80  ?02/15/22 120/70  ?  ? ?Physical Exam ?Vitals and nursing note reviewed.  ?Constitutional:   ?   Appearance: Normal appearance. She is normal weight. She is not toxic-appearing.  ?HENT:  ?   Head: Normocephalic and atraumatic.  ?  Right Ear: Tympanic membrane, ear canal and external ear normal.  ?   Left Ear: Tympanic membrane, ear canal and external ear normal.  ?   Nose: Nose normal.  ?   Mouth/Throat:  ?   Mouth: Mucous membranes are moist.  ?Eyes:  ?   Extraocular Movements: Extraocular movements intact.  ?   Conjunctiva/sclera: Conjunctivae normal.  ?   Pupils: Pupils are equal, round, and reactive to light.  ?Cardiovascular:  ?   Rate and Rhythm: Normal rate and regular rhythm.  ?   Pulses: Normal pulses.  ?   Heart sounds: Normal heart sounds.  ?Pulmonary:  ?   Effort: Pulmonary effort is normal.  ?   Breath sounds: Normal breath sounds. No decreased breath sounds.  ?Chest:  ?   Chest wall: No mass or tenderness.  ?Abdominal:  ?   General: Abdomen is flat.  ?Musculoskeletal:     ?   General: Normal range of motion.  ?   Cervical back: Normal range of motion and neck supple.  ?Skin: ?   General: Skin is warm and dry.  ?Neurological:  ?   Mental  Status: She is alert and oriented to person, place, and time.  ?Psychiatric:     ?   Mood and Affect: Mood normal.     ?   Behavior: Behavior normal.     ?   Thought Content: Thought content normal.     ?   Judgment: Judgment normal.  ? ? ?   ?Assessment & Plan:  ? ?Problem List Items Addressed This Visit   ?None ?Visit Diagnoses   ? ? Acute cough    -  Primary  ? ?  ? ? ? ?Meds ordered this encounter  ?Medications  ? predniSONE (DELTASONE) 20 MG tablet  ?  Sig: Take 1 tablet (20 mg total) by mouth 2 (two) times daily with a meal for 5 days.  ?  Dispense:  10 tablet  ?  Refill:  0  ?  Order Specific Question:   Supervising Provider  ?  Answer:   Marin Olp [[id number]]  ? benzonatate (TESSALON PERLES) 100 MG capsule  ?  Sig: Take 1 capsule (100 mg total) by mouth 3 (three) times daily as needed for cough.  ?  Dispense:  30 capsule  ?  Refill:  0  ?  Order Specific Question:   Supervising Provider  ?  Answer:   Marin Olp [[id number]]  ? albuterol (VENTOLIN HFA) 108 (90 Base) MCG/ACT inhaler  ?  Sig: Inhale 2 puffs into the lungs every 6 (six) hours as needed for wheezing or shortness of breath.  ?  Dispense:  8 g  ?  Refill:  0  ?  Order Specific Question:   Supervising Provider  ?  Answer:   Marin Olp [[id number]]  ? ?1. Acute cough ?No red flags on exam.  Vital signs are normal.  No signs of respiratory distress.  Possibly flareup of asthma which she has had in the past.  Just treated with Z-Pak for acute pharyngitis.  Less likely to be bacterial etiology at this point.  Lungs are clear.  I do not think an x-ray is necessary at this point.  Treat with prednisone, Tessalon Perles, albuterol inhaler as directed.  Postnasal drip certainly contributing to cough as well.  She can treat with Flonase and allergy medication.  She is going to take a COVID test at home and let me know  results.  She is also going to follow-up by Friday to let me know how she is doing.  ER precautions given. ? ? ? ?This note was prepared  with assistance of Systems analyst. Occasional wrong-word or sound-a-like substitutions may have occurred due to the inherent limitations of voice recognition software. ? ? ? ?Foxx Klarich M Nykiah Ma, PA-C ?

## 2022-02-21 NOTE — Patient Instructions (Signed)
Good to see you today.  Most likely you have postinfectious cough.  This could also be secondary to postnasal drip.  I would suggest starting on an over-the-counter allergy medication.  I have sent a rescue inhaler, prednisone, Tessalon Perles to your pharmacy to help with symptoms. ? ?If you experience sudden severe shortness of breath or chest pain or pressure, please call 911 or present to the nearest emergency department. ? ?Give me an update by Friday how you are doing. ? ?Keep hydrated and try to rest as best you can. ? ?I would suggest also checking a COVID-19 home test in the next 24 hours and let me know the results of this as well. Thanks! ?

## 2022-02-21 NOTE — Telephone Encounter (Signed)
Pt has an appt today at 11:30 with Allwardt. ? ? ?Patient ?Name: ?Debbie ?E [name] ?Gender: Female ?DOB: 1971-08-30 ?Age: 51 Y 2 M 18 D ?Return ?Phone ?Number: ?[phone] ?(Primary) ?Address: ?City/ ?State/ ?Zip: ?[place] ? [address] ?Client [facility] - ?Client ?[facility] ?Provider Allwardt, Alyssa- PA ?Contact Type Call ?Who Is Calling Patient / Member / Family / Caregiver ?Call Type Triage / Clinical ?Relationship To Patient Self ?Return Phone Number [phone] (Primary) ?Chief Complaint BREATHING - shortness of breath or sounds ?breathless ?Reason for Call Symptomatic / Request for Health Information ?Initial Comment Caller states that she has SOB and coughing.Caller ?states that she has chest pain from coughing, Caller ?states that she has SOB when she talks for too ?long. ?Translation No ?Nurse Assessment ?Nurse: White, RN, Merleen Nicely Date/Time (Eastern Time): 02/20/2022 5:44:12 PM ?Confirm and document reason for call. If ?symptomatic, describe symptoms. ?---Caller states she is having SOB and chest pain ?with cough. states she has lingering cough finished ?antibiotic for strep throat this week. Denies any fever. ?States drinking water helps. ?Does the patient have any new or worsening ?symptoms? ---Yes ?Will a triage be completed? ---Yes ?Related visit to physician within the last 2 weeks? ---Yes ?Does the PT have any chronic conditions? (i.e. ?diabetes, asthma, this includes High risk factors for ?pregnancy, etc.) ?---Yes ?List chronic conditions. ---childhood asthma ?Is the patient pregnant or possibly pregnant? (Ask ?all females between the ages of 20-55) ---No ?Is this a behavioral health or substance abuse call? ---No ?Guidelines ?Guideline Title Affirmed Question Affirmed Notes Nurse Date/Time (Eastern ?Time) ?Cough - Acute NonProductive ?[1] Continuous ?(nonstop) coughing ?Dema Severin, RN, Merleen Nicely 02/20/2022 5:49:01  PM ?Guidelines ?Guideline Title Affirmed Question Affirmed Notes Nurse Date/Time (Eastern ?Time) ?interferes with work ?or school AND [2] no ?improvement using ?cough treatment per ?Care Advice ?Disp. Time (Eastern ?Time) Disposition Final User ?02/20/2022 5:42:49 PM Send to Urgent Christean Leaf, Arta Bruce ?02/20/2022 5:57:31 PM See PCP within 24 Hours Yes White, RN, Merleen Nicely ?Caller Disagree/Comply Comply ?Caller Understands Yes ?PreDisposition Call Doctor ?Care Advice Given Per Guideline ?SEE PCP WITHIN 24 HOURS: * IF OFFICE WILL BE OPEN: You need to be examined within the next 24 hours. Call your ?doctor (or NP/PA) when the office opens and make an appointment. * COUGH DROPS: Over-the-counter cough drops can help a ?lot, especially for mild coughs. They soothe an irritated throat and remove the tickle sensation in the back of the throat. Cough drops ?are easy to carry with you. * HOME REMEDY - HONEY: This old home remedy has been shown to help decrease coughing at ?night. The adult dosage is 2 teaspoons (10 ml) at bedtime. HUMIDIFIER: * If the air is dry, use a humidifier in the bedroom. AVOID ?TOBACCO SMOKE: * Drink warm fluids. Inhale warm mist. This can help relax the airway and also loosen up phlegm. CALL ?BACK IF: * Difficulty breathing occurs * You become worse CARE ADVICE given per Cough - Acute Non-Productive (Adult) ?guideline. ?Referrals ?REFERRED TO PCP OFFICE ?

## 2022-02-21 NOTE — Telephone Encounter (Signed)
Pharmacy states Ventilin is not covered. Pro air  or provent are recommended. ? ?albuterol (VENTOLIN HFA) 108 (90 Base) MCG/ACT inhaler [[id number]]  ?

## 2022-02-21 NOTE — Telephone Encounter (Signed)
Any preferences?  ?

## 2022-02-21 NOTE — Telephone Encounter (Signed)
Sent to pharmacy 

## 2022-02-22 ENCOUNTER — Ambulatory Visit: Payer: BC Managed Care – PPO | Admitting: Physician Assistant

## 2022-02-22 ENCOUNTER — Encounter: Payer: Self-pay | Admitting: Physician Assistant

## 2022-02-24 ENCOUNTER — Telehealth: Payer: Self-pay

## 2022-02-24 NOTE — Telephone Encounter (Signed)
Is requesting a call back in regard to a script received on 4/9 for ventalin.  States ins does not cover.

## 2022-02-27 ENCOUNTER — Other Ambulatory Visit: Payer: Self-pay | Admitting: Physician Assistant

## 2022-02-27 MED ORDER — ALBUTEROL SULFATE HFA 108 (90 BASE) MCG/ACT IN AERS: 2.0000 | INHALATION_SPRAY | Freq: Four times a day (QID) | RESPIRATORY_TRACT | 2 refills | Status: DC | PRN

## 2022-03-16 ENCOUNTER — Encounter: Payer: Self-pay | Admitting: Gastroenterology

## 2022-03-22 ENCOUNTER — Ambulatory Visit (AMBULATORY_SURGERY_CENTER): Payer: BC Managed Care – PPO | Admitting: Gastroenterology

## 2022-03-22 ENCOUNTER — Encounter: Payer: Self-pay | Admitting: Gastroenterology

## 2022-03-22 VITALS — BP 122/80 | HR 69 | Temp 99.3°F | Resp 17 | Ht 61.0 in | Wt 171.0 lb

## 2022-03-22 DIAGNOSIS — K621 Rectal polyp: Secondary | ICD-10-CM | POA: Diagnosis not present

## 2022-03-22 DIAGNOSIS — Z800 Family history of malignant neoplasm of digestive organs: Secondary | ICD-10-CM | POA: Diagnosis not present

## 2022-03-22 DIAGNOSIS — D128 Benign neoplasm of rectum: Secondary | ICD-10-CM

## 2022-03-22 DIAGNOSIS — Z1211 Encounter for screening for malignant neoplasm of colon: Secondary | ICD-10-CM

## 2022-03-22 MED ORDER — SODIUM CHLORIDE 0.9 % IV SOLN: 500.0000 mL | Freq: Once | INTRAVENOUS | Status: DC

## 2022-03-22 NOTE — Progress Notes (Signed)
Called to room to assist during endoscopic procedure.  Patient ID and intended procedure confirmed with present staff. Received instructions for my participation in the procedure from the performing physician.  

## 2022-03-22 NOTE — Patient Instructions (Signed)
Please read handouts provided. Continue present medications. Await pathology results. Repeat colonoscopy in 5 years for screening.   YOU HAD AN ENDOSCOPIC PROCEDURE TODAY AT [facility]:   Refer to the procedure report that was given to you for any specific questions about what was found during the examination.  If the procedure report does not answer your questions, please call your gastroenterologist to clarify.  If you requested that your care partner not be given the details of your procedure findings, then the procedure report has been included in a sealed envelope for you to review at your convenience later.  YOU SHOULD EXPECT: Some feelings of bloating in the abdomen. Passage of more gas than usual.  Walking can help get rid of the air that was put into your GI tract during the procedure and reduce the bloating. If you had a lower endoscopy (such as a colonoscopy or flexible sigmoidoscopy) you may notice spotting of blood in your stool or on the toilet paper. If you underwent a bowel prep for your procedure, you may not have a normal bowel movement for a few days.  Please Note:  You might notice some irritation and congestion in your nose or some drainage.  This is from the oxygen used during your procedure.  There is no need for concern and it should clear up in a day or so.  SYMPTOMS TO REPORT IMMEDIATELY:  Following lower endoscopy (colonoscopy or flexible sigmoidoscopy):  Excessive amounts of blood in the stool  Significant tenderness or worsening of abdominal pains  Swelling of the abdomen that is new, acute  Fever of 100F or higher  For urgent or emergent issues, a gastroenterologist can be reached at any hour by calling [phone]. Do not use MyChart messaging for urgent concerns.    DIET:  We do recommend a small meal at first, but then you may proceed to your regular diet.  Drink plenty of fluids but you should avoid alcoholic beverages for 24  hours.  ACTIVITY:  You should plan to take it easy for the rest of today and you should NOT DRIVE or use heavy machinery until tomorrow (because of the sedation medicines used during the test).    FOLLOW UP: Our staff will call the number listed on your records 24-72 hours following your procedure to check on you and address any questions or concerns that you may have regarding the information given to you following your procedure. If we do not reach you, we will leave a message.  We will attempt to reach you two times.  During this call, we will ask if you have developed any symptoms of COVID 19. If you develop any symptoms (ie: fever, flu-like symptoms, shortness of breath, cough etc.) before then, please call [phone].  If you test positive for Covid 19 in the 2 weeks post procedure, please call and report this information to [place].    If any biopsies were taken you will be contacted by phone or by letter within the next 1-3 weeks.  Please call us at [phone] if you have not heard about the biopsies in 3 weeks.    SIGNATURES/CONFIDENTIALITY: You and/or your care partner have signed paperwork which will be entered into your electronic medical record.  These signatures attest to the fact that that the information above on your After Visit Summary has been reviewed and is understood.  Full responsibility of the confidentiality of this discharge information lies with you and/or your care-partner.

## 2022-03-22 NOTE — Progress Notes (Signed)
Pt's states no medical or surgical changes since previsit or office visit. 

## 2022-03-22 NOTE — Op Note (Signed)
[facility] Patient Name: Debbie [name] Procedure Date: 03/22/2022 10:52 AM MRN: [id number] Endoscopist: Thornton Park MD, MD Age: 51 Referring MD:  Date of Birth: [birth year] Gender: Female Account #: [id number] Procedure:                Colonoscopy Indications:              Screening for colorectal malignant neoplasm, This                            is the patient's first colonoscopy                           Father and cousin with [name] cancer Medicines:                Monitored Anesthesia Care Procedure:                Pre-Anesthesia Assessment:                           - Prior to the procedure, a History and Physical                            was performed, and patient medications and                            allergies were reviewed. The patient's tolerance of                            previous anesthesia was also reviewed. The risks                            and benefits of the procedure and the sedation                            options and risks were discussed with the patient.                            All questions were answered, and informed consent                            was obtained. Prior Anticoagulants: The patient has                            taken no previous anticoagulant or antiplatelet                            agents. ASA Grade Assessment: II - A patient with                            mild systemic disease. After reviewing the risks                            and benefits, the patient was deemed in  satisfactory condition to undergo the procedure.                           After obtaining informed consent, the colonoscope                            was passed under direct vision. Throughout the                            procedure, the patient's blood pressure, pulse, and                            oxygen saturations were monitored continuously. The                            Colonoscope was introduced  through the anus and                            advanced to the the cecum, identified by                            appendiceal orifice and ileocecal valve. A second                            forward view of the right [name] was performed. The                            colonoscopy was performed without difficulty. The                            patient tolerated the procedure well. The quality                            of the bowel preparation was excellent. The                            ileocecal valve, appendiceal orifice, and rectum                            were photographed. Scope In: 10:57:51 AM Scope Out: 11:12:20 AM Scope Withdrawal Time: 0 hours 12 minutes 14 seconds  Total Procedure Duration: 0 hours 14 minutes 29 seconds  Findings:                 The perianal and digital rectal examinations were                            normal.                           A 2 mm polyp was found in the rectum. The polyp was                            sessile. The polyp was removed with a cold snare.  Resection and retrieval were complete. Estimated                            blood loss was minimal. Complications:            No immediate complications. Estimated Blood Loss:     Estimated blood loss was minimal. Impression:               - One 2 mm polyp in the rectum, removed with a cold                            snare. Resected and retrieved. Recommendation:           - Patient has a contact number available for                            emergencies. The signs and symptoms of potential                            delayed complications were discussed with the                            patient. Return to normal activities tomorrow.                            Written discharge instructions were provided to the                            patient.                           - Resume previous diet.                           - Continue present medications.                            - Await pathology results.                           - Repeat colonoscopy in 5 years for surveillance                            given the family history.                           - Emerging evidence supports eating a diet of                            fruits, vegetables, grains, calcium, and yogurt                            while reducing red meat and alcohol may reduce the                            risk of [name] cancer. Thornton Park MD, MD 03/22/2022 11:18:34 AM  This report has been signed electronically.

## 2022-03-22 NOTE — Progress Notes (Signed)
PT taken to PACU. Monitors in place. VSS. Report given to RN. 

## 2022-03-22 NOTE — Progress Notes (Signed)
Indication for colonoscopy: Colon cancer screening  Please see my 02/15/2022 office note for complete details.  There is been no change in history or physical exam since that time.  The patient remains an appropriate candidate for monitored anesthesia care in the endoscopy center today.

## 2022-03-23 ENCOUNTER — Telehealth: Payer: Self-pay

## 2022-03-23 NOTE — Telephone Encounter (Signed)
First attempt follow up call to pt, lm for pt to call if having any problems or questions, otherwise we will call them back later this morning or early this afternoon.  °

## 2022-03-23 NOTE — Telephone Encounter (Signed)
Second attempt follow up call to pt, no answer.  

## 2022-03-27 ENCOUNTER — Telehealth: Payer: Self-pay | Admitting: Physician Assistant

## 2022-03-27 ENCOUNTER — Encounter: Payer: Self-pay | Admitting: Physician Assistant

## 2022-03-27 ENCOUNTER — Ambulatory Visit: Payer: BC Managed Care – PPO | Admitting: Physician Assistant

## 2022-03-27 ENCOUNTER — Encounter: Payer: Self-pay | Admitting: Gastroenterology

## 2022-03-27 VITALS — BP 128/86 | HR 69 | Temp 98.1°F | Ht 61.0 in | Wt 171.0 lb

## 2022-03-27 DIAGNOSIS — R079 Chest pain, unspecified: Secondary | ICD-10-CM | POA: Diagnosis not present

## 2022-03-27 DIAGNOSIS — F411 Generalized anxiety disorder: Secondary | ICD-10-CM

## 2022-03-27 LAB — COMPREHENSIVE METABOLIC PANEL
ALT: 11 U/L (ref 0–35)
AST: 16 U/L (ref 0–37)
Albumin: 4.2 g/dL (ref 3.5–5.2)
Alkaline Phosphatase: 87 U/L (ref 39–117)
BUN: 13 mg/dL (ref 6–23)
CO2: 33 mEq/L — ABNORMAL HIGH (ref 19–32)
Calcium: 9.8 mg/dL (ref 8.4–10.5)
Chloride: 100 mEq/L (ref 96–112)
Creatinine, Ser: 0.6 mg/dL (ref 0.40–1.20)
GFR: 104.01 mL/min (ref >60.00)
Glucose, Bld: 91 mg/dL (ref 70–99)
Potassium: 3.9 mEq/L (ref 3.5–5.1)
Sodium: 139 mEq/L (ref 135–145)
Total Bilirubin: 0.4 mg/dL (ref 0.2–1.2)
Total Protein: 7 g/dL (ref 6.0–8.3)

## 2022-03-27 LAB — CBC WITH DIFFERENTIAL/PLATELET
Basophils Absolute: 0.1 10*3/uL (ref 0.0–0.1)
Basophils Relative: 0.8 % (ref 0.0–3.0)
Eosinophils Absolute: 0.2 10*3/uL (ref 0.0–0.7)
Eosinophils Relative: 2.4 % (ref 0.0–5.0)
HCT: 39 % (ref 36.0–46.0)
Hemoglobin: 12.7 g/dL (ref 12.0–15.0)
Lymphocytes Relative: 29.9 % (ref 12.0–46.0)
Lymphs Abs: 2.8 10*3/uL (ref 0.7–4.0)
MCHC: 32.4 g/dL (ref 30.0–36.0)
MCV: 83.5 fl (ref 78.0–100.0)
Monocytes Absolute: 0.6 10*3/uL (ref 0.1–1.0)
Monocytes Relative: 6.4 % (ref 3.0–12.0)
Neutro Abs: 5.6 10*3/uL (ref 1.4–7.7)
Neutrophils Relative %: 60.5 % (ref 43.0–77.0)
Platelets: 401 10*3/uL — ABNORMAL HIGH (ref 150.0–400.0)
RBC: 4.67 Mil/uL (ref 3.87–5.11)
RDW: 15.1 % (ref 11.5–15.5)
WBC: 9.3 10*3/uL (ref 4.0–10.5)

## 2022-03-27 LAB — TROPONIN I: Troponin I: <3 ng/L (ref <=47)

## 2022-03-27 LAB — BRAIN NATRIURETIC PEPTIDE: Pro B Natriuretic peptide (BNP): 27 pg/mL (ref 0.0–100.0)

## 2022-03-27 LAB — D-DIMER, QUANTITATIVE: D-Dimer, Quant: 0.34 mcg/mL FEU (ref <0.50)

## 2022-03-27 NOTE — Telephone Encounter (Signed)
Patient has scheduled an appointment through [facility] for chest pain on 03/29/22.    I have sent patient to triage.

## 2022-03-27 NOTE — Patient Instructions (Signed)
STAT labs and Chest XRAY - make sure we have the correct number to call you with results. Your EKG looks good.  If at any time today your chest pain worsens, you become severely short of breath, weak / dizzy, or other acute concerns, please go straight to the ER.  Pending normal results, we can discuss further ways to manage stress / anxiety.

## 2022-03-27 NOTE — Telephone Encounter (Signed)
Triage notes; pt scheduled for office visit today

## 2022-03-27 NOTE — Telephone Encounter (Signed)
   Disp. Time Eilene Ghazi Time) Disposition Final User 6/12/Patient Name: Debbie [name] COLON Gender: Female DOB: 07-05-1971 Age: 51 Y 3 M 23 D Return Phone Number: [phone] (Primary), [phone] (Secondary) Address: City/ State/ Zip: [place]   [address] Client [facility] Client Site [facility] Paediatric nurse, Freight forwarder- PA Contact Type Call Who Is Calling Patient / Member / Family / Caregiver Call Type Triage / Clinical Relationship To Patient Self Return Phone Number [phone] (Primary) Chief Complaint CHEST PAIN - pain, pressure, heaviness or tightness Reason for Call Symptomatic / Request for [place] has an appt on the 14th, chest pain. Translation No Nurse Assessment Nurse: Markus Daft, RN, Windy Date/Time (Eastern Time): 03/27/2022 9:50:29 AM Confirm and document reason for call. If symptomatic, describe symptoms. ---Caller c/o chest pain. 1-2 wks ago she fell, and CP is when she breaths, and not as strong now. She was cleaning and she fell hit one side which is what hurts. Does the patient have any new or worsening symptoms? ---Yes Will a triage be completed? ---Yes Related visit to physician within the last 2 weeks? ---No Does the PT have any chronic conditions? (i.e. diabetes, asthma, this includes High risk factors for pregnancy, etc.) ---Yes List chronic conditions. ---asthma Is the patient pregnant or possibly pregnant? (Ask all females between the ages of 5-55) ---No Is this a behavioral health or substance abuse call? ---No Guidelines Guideline Title Affirmed Question Affirmed Notes Nurse Date/Time (Eastern Time) Chest Injury [1] After 72 hours AND [2] chest pain not improving Jackqulyn Livings 03/27/2022 9:52:48 2023 9:43:18 AM Send to Urgent Clarnce Flock 03/27/2022 9:56:29 AM SEE PCP WITHIN 3 DAYS Yes Markus Daft, RN, Sherre Poot Caller  Disagree/Comply Comply Caller Understands Yes PreDisposition Go to Urgent Care/Walk-In Clinic Care Advice Given Per Guideline SEE PCP WITHIN 3 DAYS: * You need to be seen within 2 or 3 days. PAIN MEDICINES: * For pain relief, you can take either acetaminophen, ibuprofen, or naproxen. * Before taking any medicine, read all the instructions on the package. USE HEAT ON AREA AFTER 48 HOURS: * If pain, swelling, or bruising last more than 48 hours (2 days), then use heat on the area. * Use a heat pack, heating pad, or warm wet washcloth. * Do this for 10 minutes three times a day. * This will help increase blood flow and improve healing. LIMIT ACTIVITIES: * Avoid strenuous activities or contact sports until the pain is gone. CALL BACK IF: * You become worse CARE ADVICE given per Chest Injury (Adult) guideline.

## 2022-03-27 NOTE — Telephone Encounter (Signed)
See note; pt sent to triage chest pain

## 2022-03-27 NOTE — Progress Notes (Unsigned)
Subjective:    Patient ID: Debbie [name], female    DOB: 07/12/1971, 51 y.o.   MRN: [id number]  Chief Complaint  Patient presents with   Chest Pain    Pt spoke with triage and scheduled appt to be seen due to chest pains and chest pressure on both sides for 2-3 days and at times when breathing;     HPI Patient is in today for chest pain. Started last week - left sided, felt it when she was breathing. States she last felt chest pain about 30 minutes ago. Goes across her chest.  4/10 pain currently; worse pain was 7/10 about 4 hours prior to arrival. Took one of her alprazolam this morning and did not feel much improvement. Feels like some pressure pain right now, much worse this morning. Has felt some SOB. No dizziness, some headaches. She has felt a lot more stress lately both at work and situational at home.  No recent travel. No recent illness. Still goes up and down stairs / exerts herself without worsening of symptoms.   Past Medical History:  Diagnosis Date   Anemia    Dysmenorrhea    Headache(784.0)    Hypoglycemia    possible per pt- becomes shaky and weak when hungry    Past Surgical History:  Procedure Laterality Date   CESAREAN SECTION     X2   ROBOTIC ASSISTED TOTAL HYSTERECTOMY WITH BILATERAL SALPINGO OOPHERECTOMY Right 05/18/2015   Procedure: ROBOTIC ASSISTED LAPAROSCOPIC  TOTAL HYSTERECTOMY WITH RIGHT SALPINGECTOMY, LYSIS OF ADHESIONS, CYSTOSCOPY;  Surgeon: Lavonia Drafts, MD;  Location: [facility];  Service: Gynecology;  Laterality: Right;   TUBAL LIGATION      Family History  Problem Relation Age of Onset   Hypertension Mother    Diabetes Mother    Hypertension Father    Diabetes Father    Breast cancer Sister    Cancer Paternal Aunt 39       BREAST CANCER   Cancer Paternal Grandfather        prostate   [name] cancer Cousin    Cancer Cousin        [name]   Anesthesia problems Neg Hx     Social History   Tobacco Use   Smoking  status: Never   Smokeless tobacco: Never  Vaping Use   Vaping Use: Never used  Substance Use Topics   Alcohol use: Yes    Comment: occ   Drug use: No     Allergies  Allergen Reactions   Penicillins Other (See Comments)    Not sure if she has true allergy to Penicillin but her father told her that she or one of her sisters had a reaction.    Review of Systems NEGATIVE UNLESS OTHERWISE INDICATED IN HPI      Objective:     BP 128/86 (BP Location: Left Arm)   Pulse 69   Temp 98.1 F (36.7 C) (Temporal)   Ht '5\' 1"'$  (1.549 m)   Wt 171 lb (77.6 kg)   LMP  (LMP Unknown) Comment: pt on a pill to stop menses  SpO2 98%   BMI 32.31 kg/m   Wt Readings from Last 3 Encounters:  03/27/22 171 lb (77.6 kg)  03/22/22 171 lb (77.6 kg)  02/21/22 170 lb 9.6 oz (77.4 kg)    BP Readings from Last 3 Encounters:  03/27/22 128/86  03/22/22 122/80  02/21/22 130/85     Physical Exam Vitals and nursing note reviewed.  Constitutional:  General: She is not in acute distress.    Appearance: Normal appearance. She is normal weight. She is not ill-appearing, toxic-appearing or diaphoretic.  HENT:     Head: Normocephalic and atraumatic.     Right Ear: Tympanic membrane, ear canal and external ear normal.     Left Ear: Tympanic membrane, ear canal and external ear normal.     Nose: Nose normal.     Mouth/Throat:     Mouth: Mucous membranes are moist.  Eyes:     Extraocular Movements: Extraocular movements intact.     Conjunctiva/sclera: Conjunctivae normal.     Pupils: Pupils are equal, round, and reactive to light.  Cardiovascular:     Rate and Rhythm: Normal rate and regular rhythm.     Pulses: Normal pulses.     Heart sounds: Normal heart sounds.  Pulmonary:     Effort: Pulmonary effort is normal.     Breath sounds: Normal breath sounds.  Abdominal:     General: Abdomen is flat. Bowel sounds are normal.     Palpations: Abdomen is soft.  Musculoskeletal:        General:  Normal range of motion.     Cervical back: Normal range of motion and neck supple.  Skin:    General: Skin is warm and dry.  Neurological:     General: No focal deficit present.     Mental Status: She is alert and oriented to person, place, and time.  Psychiatric:        Mood and Affect: Mood normal.        Behavior: Behavior normal.        Thought Content: Thought content normal.        Judgment: Judgment normal.        Assessment & Plan:   Problem List Items Addressed This Visit   None Visit Diagnoses     Chest pain, unspecified type    -  Primary   Relevant Orders   EKG 12-Lead (Completed)   DG Chest 2 View   CBC with Differential/Platelet (Completed)   Comprehensive metabolic panel (Completed)   Troponin I (Completed)   B Nat Peptide (Completed)   D-Dimer, Quantitative (Completed)   Generalized anxiety disorder          1. Chest pain, unspecified type 2. Generalized anxiety disorder I personally reviewed patient's EKG in office today and noted sinus bradycardia with heart rate of 58 bpm.  No ST or T wave changes.  No changes noted from EKG on 03/28/2021. As she is over the age of 37 and female and having complaints of chest pain, I do think it best to check some stat labs today and a chest x-ray. Patient was advised of strict ER precautions.  She is agreeable and understanding. Pending normal labs and chest x-ray, we will need to address her anxiety as the most likely underlying cause of her symptoms. (She says multiple times in exam room she thinks she's stressed and anxious about life events going on around her).   This note was prepared with assistance of Systems analyst. Occasional wrong-word or sound-a-like substitutions may have occurred due to the inherent limitations of voice recognition software.  Time Spent: In addition to time spent for ekg, I spent 30 minutes of total time on the date of the encounter performing the following actions:  chart review prior to seeing the patient, obtaining history, performing a medically necessary exam, counseling on the treatment plan, placing orders, and  documenting in our EHR.     Layaan Mott M Madhavi Hamblen, PA-C

## 2022-03-29 ENCOUNTER — Other Ambulatory Visit: Payer: Self-pay | Admitting: *Deleted

## 2022-03-29 ENCOUNTER — Ambulatory Visit: Payer: BC Managed Care – PPO | Admitting: Physician Assistant

## 2022-03-29 MED ORDER — SERTRALINE HCL 50 MG PO TABS: 50.0000 mg | ORAL_TABLET | Freq: Every day | ORAL | 3 refills | Status: DC

## 2022-04-06 ENCOUNTER — Encounter: Payer: Self-pay | Admitting: Physician Assistant

## 2022-04-06 NOTE — Telephone Encounter (Signed)
Patient called and requests a call back ph# [phone] or ph# [phone] about this message below. Patient states she wants to make sure that the [facility] Paper work will be filled out by [employer] before sending the [facility] paper work to be filled out.  Send back to [place] for any needed scheduling.

## 2022-05-09 ENCOUNTER — Other Ambulatory Visit: Payer: Self-pay | Admitting: Physician Assistant

## 2022-05-24 ENCOUNTER — Ambulatory Visit (INDEPENDENT_AMBULATORY_CARE_PROVIDER_SITE_OTHER): Payer: BC Managed Care – PPO | Admitting: Physician Assistant

## 2022-05-24 ENCOUNTER — Encounter: Payer: Self-pay | Admitting: Physician Assistant

## 2022-05-24 VITALS — BP 144/88 | HR 73 | Temp 98.4°F | Ht 61.0 in | Wt 170.0 lb

## 2022-05-24 DIAGNOSIS — I10 Essential (primary) hypertension: Secondary | ICD-10-CM | POA: Diagnosis not present

## 2022-05-24 DIAGNOSIS — F411 Generalized anxiety disorder: Secondary | ICD-10-CM

## 2022-05-24 DIAGNOSIS — Z0000 Encounter for general adult medical examination without abnormal findings: Secondary | ICD-10-CM | POA: Diagnosis not present

## 2022-05-24 DIAGNOSIS — Z13220 Encounter for screening for lipoid disorders: Secondary | ICD-10-CM

## 2022-05-24 DIAGNOSIS — Z131 Encounter for screening for diabetes mellitus: Secondary | ICD-10-CM

## 2022-05-24 LAB — COMPREHENSIVE METABOLIC PANEL
ALT: 11 U/L (ref 0–35)
AST: 16 U/L (ref 0–37)
Albumin: 4.4 g/dL (ref 3.5–5.2)
Alkaline Phosphatase: 68 U/L (ref 39–117)
BUN: 14 mg/dL (ref 6–23)
CO2: 27 mEq/L (ref 19–32)
Calcium: 9.4 mg/dL (ref 8.4–10.5)
Chloride: 104 mEq/L (ref 96–112)
Creatinine, Ser: 0.59 mg/dL (ref 0.40–1.20)
GFR: 104.32 mL/min (ref >60.00)
Glucose, Bld: 96 mg/dL (ref 70–99)
Potassium: 4.5 mEq/L (ref 3.5–5.1)
Sodium: 139 mEq/L (ref 135–145)
Total Bilirubin: 0.4 mg/dL (ref 0.2–1.2)
Total Protein: 7.3 g/dL (ref 6.0–8.3)

## 2022-05-24 LAB — CBC WITH DIFFERENTIAL/PLATELET
Basophils Absolute: 0.1 10*3/uL (ref 0.0–0.1)
Basophils Relative: 1.2 % (ref 0.0–3.0)
Eosinophils Absolute: 0.2 10*3/uL (ref 0.0–0.7)
Eosinophils Relative: 2.6 % (ref 0.0–5.0)
HCT: 41.5 % (ref 36.0–46.0)
Hemoglobin: 13.4 g/dL (ref 12.0–15.0)
Lymphocytes Relative: 32.7 % (ref 12.0–46.0)
Lymphs Abs: 2.4 10*3/uL (ref 0.7–4.0)
MCHC: 32.3 g/dL (ref 30.0–36.0)
MCV: 84.9 fl (ref 78.0–100.0)
Monocytes Absolute: 0.6 10*3/uL (ref 0.1–1.0)
Monocytes Relative: 7.9 % (ref 3.0–12.0)
Neutro Abs: 4.1 10*3/uL (ref 1.4–7.7)
Neutrophils Relative %: 55.6 % (ref 43.0–77.0)
Platelets: 336 10*3/uL (ref 150.0–400.0)
RBC: 4.89 Mil/uL (ref 3.87–5.11)
RDW: 15 % (ref 11.5–15.5)
WBC: 7.4 10*3/uL (ref 4.0–10.5)

## 2022-05-24 LAB — LIPID PANEL
Cholesterol: 215 mg/dL — ABNORMAL HIGH (ref 0–200)
HDL: 37.8 mg/dL — ABNORMAL LOW (ref >39.00)
LDL Cholesterol: 146 mg/dL — ABNORMAL HIGH (ref 0–99)
NonHDL: 177.59
Total CHOL/HDL Ratio: 6
Triglycerides: 160 mg/dL — ABNORMAL HIGH (ref 0.0–149.0)
VLDL: 32 mg/dL (ref 0.0–40.0)

## 2022-05-24 LAB — TSH: TSH: 1.55 u[IU]/mL (ref 0.35–5.50)

## 2022-05-24 LAB — HEMOGLOBIN A1C: Hgb A1c MFr Bld: 5.3 % (ref 4.6–6.5)

## 2022-05-24 NOTE — Progress Notes (Signed)
Subjective:    Patient ID: Debbie [name], female    DOB: [birth year], 51 y.o.   MRN: [id number]  Chief Complaint  Patient presents with   Annual Exam    Pt in for annual CPE; pt states she gets shakes and dizziness every two hours when not eating something; Pt states she feels better after eating; pt not taking Zoloft not wanting to take medication for depression but still shows signs of depression. Pt states not taking any medication    HPI Patient is in today for annual exam.  Acute concerns: Needing labs checked  Health maintenance: Lifestyle/ exercise: Walks daily, Nutrition: Rice, beans, chicken, mostly home-cooked Mental health: She wants to see a counselor, but does not want to take medicine for her anxiety and depression Sleep: "Sleeps too much" - feels tired after work, has to start work at 4 am, works to 1 pm  Substance use: None ETOH: None Sexual activity: Active, monogamous, declines STD screening  Immunizations: Consider Shingrix Colonoscopy: UTD Pap: Hx hysterectomy Mammogram: UTD   Past Medical History:  Diagnosis Date   Anemia    Dysmenorrhea    Headache(784.0)    Hypoglycemia    possible per pt- becomes shaky and weak when hungry    Past Surgical History:  Procedure Laterality Date   CESAREAN SECTION     X2   ROBOTIC ASSISTED TOTAL HYSTERECTOMY WITH BILATERAL SALPINGO OOPHERECTOMY Right 05/18/2015   Procedure: ROBOTIC ASSISTED LAPAROSCOPIC  TOTAL HYSTERECTOMY WITH RIGHT SALPINGECTOMY, LYSIS OF ADHESIONS, CYSTOSCOPY;  Surgeon: Lavonia Drafts, MD;  Location: [facility];  Service: Gynecology;  Laterality: Right;   TUBAL LIGATION      Family History  Problem Relation Age of Onset   Hypertension Mother    Diabetes Mother    Hypertension Father    Diabetes Father    Breast cancer Sister    Cancer Paternal Aunt 46       BREAST CANCER   Cancer Paternal Grandfather        prostate   [name] cancer Cousin    Cancer Cousin        [name].Marland Kitchen PATERNAL  SIDE   Anesthesia problems Neg Hx     Social History   Tobacco Use   Smoking status: Never   Smokeless tobacco: Never  Vaping Use   Vaping Use: Never used  Substance Use Topics   Alcohol use: Yes    Comment: occ   Drug use: No     Allergies  Allergen Reactions   Penicillins Other (See Comments)    Not sure if she has true allergy to Penicillin but her father told her that she or one of her sisters had a reaction.    Review of Systems NEGATIVE UNLESS OTHERWISE INDICATED IN HPI      Objective:     BP (!) 144/88 (BP Location: Left Arm) Comment (BP Location): manually  Pulse 73   Temp 98.4 F (36.9 C) (Temporal)   Ht '5\' 1"'$  (1.549 m)   Wt 170 lb (77.1 kg)   LMP  (LMP Unknown) Comment: pt on a pill to stop menses  SpO2 98%   BMI 32.12 kg/m   Wt Readings from Last 3 Encounters:  05/24/22 170 lb (77.1 kg)  03/27/22 171 lb (77.6 kg)  03/22/22 171 lb (77.6 kg)    BP Readings from Last 3 Encounters:  05/24/22 (!) 144/88  03/27/22 128/86  03/22/22 122/80     Physical Exam Vitals and nursing note reviewed.  Constitutional:  Appearance: Normal appearance. She is normal weight. She is not toxic-appearing.  HENT:     Head: Normocephalic and atraumatic.     Right Ear: Tympanic membrane, ear canal and external ear normal.     Left Ear: Tympanic membrane, ear canal and external ear normal.     Nose: Nose normal.     Mouth/Throat:     Mouth: Mucous membranes are moist.  Eyes:     Extraocular Movements: Extraocular movements intact.     Conjunctiva/sclera: Conjunctivae normal.     Pupils: Pupils are equal, round, and reactive to light.  Cardiovascular:     Rate and Rhythm: Normal rate and regular rhythm.     Pulses: Normal pulses.     Heart sounds: Normal heart sounds.  Pulmonary:     Effort: Pulmonary effort is normal.     Breath sounds: Normal breath sounds.  Abdominal:     General: Abdomen is flat. Bowel sounds are normal.     Palpations: Abdomen is  soft.  Musculoskeletal:        General: Normal range of motion.     Cervical back: Normal range of motion and neck supple.  Skin:    General: Skin is warm and dry.  Neurological:     General: No focal deficit present.     Mental Status: She is alert and oriented to person, place, and time.  Psychiatric:        Mood and Affect: Mood normal.        Behavior: Behavior normal.        Thought Content: Thought content normal.        Judgment: Judgment normal.        Assessment & Plan:   Problem List Items Addressed This Visit   None Visit Diagnoses     Encounter for annual physical exam    -  Primary   Relevant Orders   CBC with Differential/Platelet (Completed)   Comprehensive metabolic panel (Completed)   Lipid panel (Completed)   TSH (Completed)   Hemoglobin A1c (Completed)   Essential hypertension       Generalized anxiety disorder       Relevant Orders   TSH (Completed)   Ambulatory referral to Psychology   Diabetes mellitus screening       Relevant Orders   Comprehensive metabolic panel (Completed)   Hemoglobin A1c (Completed)   Screening for cholesterol level       Relevant Orders   Lipid panel (Completed)      Plan: Age-appropriate screening and counseling performed today. Will check labs and call with results. Preventive measures discussed and printed in AVS for patient.    Patient Counseling: '[x]'$   Nutrition: Stressed importance of moderation in sodium/caffeine intake, saturated fat and cholesterol, caloric balance, sufficient intake of fresh fruits, vegetables, and fiber.  '[x]'$   Stressed the importance of regular exercise.   '[x]'$   Substance Abuse: Discussed cessation/primary prevention of tobacco, alcohol, or other drug use; driving or other dangerous activities under the influence; availability of treatment for abuse.   '[]'$   Injury prevention: Discussed safety belts, safety helmets, smoke detector, smoking near bedding or upholstery.   '[x]'$   Sexuality:  Discussed sexually transmitted diseases, partner selection, use of condoms, avoidance of unintended pregnancy  and contraceptive alternatives.   '[x]'$   Dental health: Discussed importance of regular tooth brushing, flossing, and dental visits.  '[x]'$   Health maintenance and immunizations reviewed. Please refer to Health maintenance section.    Referral to counselor as  this may help because she doesn't want to take medication for anxiety anymore.   Needs to monitor BP and work on exercise / nutrition. Let me know if staying elevated over 140/80 and we will need to adjust medication.     Return in about 6 months (around 11/24/2022) for recheck .   Kallan Bischoff M Draylon Mercadel, PA-C

## 2022-05-24 NOTE — Patient Instructions (Addendum)
Good to see you today!  Labs and recheck blood pressure.  Referral to our counselor.   Great work with health maintenance. Keep working on exercise and nutrition.

## 2022-06-12 ENCOUNTER — Ambulatory Visit: Payer: BC Managed Care – PPO | Admitting: Behavioral Health

## 2022-06-12 DIAGNOSIS — F4321 Adjustment disorder with depressed mood: Secondary | ICD-10-CM

## 2022-06-12 NOTE — Progress Notes (Signed)
                Debbie [name] [name], LMFT 

## 2022-06-12 NOTE — Progress Notes (Signed)
[place] Counselor Initial Adult Exam  Name: Debbie [name] Date: 06/12/2022 MRN: [id number] DOB: 1971/06/09 PCP: Debbie Lathe, PA-C  Time spent: 60 min In-Person visit w/Pt & Provider @ [facility]  Guardian/Payee:  Self    Paperwork requested: No   Reason for Visit /Presenting Problem: Pt exp'g grief rxn since the deaths of her Father in [name] in Feb 2023 from [name] cancer & her older Str Debbie [name]'s death in 2023/05/29 from Breast cancer. Pt is having a difficult time in her adjustment around Family that has been challenging & missing her Family members who have died.  Mental Status Exam: Appearance:   Neat     Behavior:  Appropriate  Motor:  Normal  Speech/Language:   Normal Rate  Affect:  Appropriate  Mood:  sad  Thought process:  normal  Thought content:    WNL  Sensory/Perceptual disturbances:    WNL  Orientation:  oriented to person, place, and time/date  Attention:  Good  Concentration:  Good  Memory:  WNL; Pt feels scattered-explained this is a normal component of grief  Fund of knowledge:   Good  Insight:    Good  Judgment:   Good  Impulse Control:  Good    Reported Symptoms:  Pt is crying a lot & the memories of her Father are strong, esp'ly w/some of his belongings that smell like his cologne. Pt has lived in the [place] since her Father immigrated from [name] when she was 2yo. Mother still resides in [place]. Pt has one remaining Str who is living & 2 Bros. Pt does not currently communicate w/any of her Siblings due to the lack of communication prior to the death of her Str Debbie [name].  Risk Assessment: Danger to Self:  No Self-injurious Behavior: No Danger to Others: No Duty to Warn:no Physical Aggression / Violence:No  Access to Firearms a concern: No  Gang Involvement:No  Patient / guardian was educated about steps to take if suicide or homicide risk level increases between visits: no; Pt denies SI/SA or HI/HA While future psychiatric  events cannot be accurately predicted, the patient does not currently require acute inpatient psychiatric care and does not currently meet [facility] involuntary commitment criteria.  Substance Abuse History: Current substance abuse: No     Past Psychiatric History:   No previous psychological problems have been observed Outpatient Providers:Debbie Allwardt, PA-C History of Psych Hospitalization: No  Psychological Testing:  NA    Abuse History:  Victim of: No.,  NA    Report needed: No. Victim of Neglect:No. Perpetrator of  NA   Witness / Exposure to Domestic Violence: No   Protective Services Involvement: No  Witness to [employer] Violence:  No   Family History:  Family History  Problem Relation Age of Onset   Hypertension Mother    Diabetes Mother    Hypertension Father    Diabetes Father    Breast cancer Sister    Cancer Paternal Aunt 56       BREAST CANCER   Cancer Paternal Grandfather        prostate   [name] cancer Cousin    Cancer Cousin        [name]   Anesthesia problems Neg Hx     Living situation: the patient lives with their family  Sexual Orientation: Straight  Relationship Status: single  Name of spouse / other: NA If a parent, number of children / ages:20yo [name], [place], 25yo Debbie [name], &  Debbie [name]: friends Adult Geophysicist/field seismologist Stress:   Not noted today; Pt is ready to change her current job & start fresh  Income/Employment/Disability: Employment; Pt works in the [employer] Service: No   Educational History: Education: high school diploma/GED  Religion/Sprituality/World View: Catholic-raised  Any cultural differences that may affect / interfere with treatment:  Not noted today  Recreation/Hobbies: music  Stressors: Loss of 2 Cousins in January 17, 2021, Auntie died in 20-Jun-2023, beloved pet dog died the day after her [name], death of Fr in [name] in [place] & death of older Str in [name]  in July 2023    Strengths: Supportive Relationships, Family, Friends, Spirituality, and Able to Communicate Effectively  Barriers:  Pt has tried to listen to her heart & after a dream one month ago when her Dad came to her in a dream, she feels the stance to move forward in life & start fresh. She wants to travel to [name] & visit.   Legal History: Pending legal issue / charges: The patient has no significant history of legal issues. History of legal issue / charges:  NA  Medical History/Surgical History: reviewed Past Medical History:  Diagnosis Date   Anemia    Dysmenorrhea    Headache(784.0)    Hypoglycemia    possible per pt- becomes shaky and weak when hungry    Past Surgical History:  Procedure Laterality Date   CESAREAN SECTION     X2   ROBOTIC ASSISTED TOTAL HYSTERECTOMY WITH BILATERAL SALPINGO OOPHERECTOMY Right 05/18/2015   Procedure: ROBOTIC ASSISTED LAPAROSCOPIC  TOTAL HYSTERECTOMY WITH RIGHT SALPINGECTOMY, LYSIS OF ADHESIONS, CYSTOSCOPY;  Surgeon: Lavonia Drafts, MD;  Location: [facility];  Service: Gynecology;  Laterality: Right;   TUBAL LIGATION      Medications: Current Outpatient Medications  Medication Sig Dispense Refill   albuterol (PROVENTIL) (2.5 MG/3ML) 0.083% nebulizer solution Take 3 mLs (2.5 mg total) by nebulization every 6 (six) hours as needed. 75 mL 0   albuterol (VENTOLIN HFA) 108 (90 Base) MCG/ACT inhaler INHALE 2 PUFFS BY MOUTH EVERY 6 HOURS AS NEEDED FOR WHEEZING OR SHORTNESS OF BREATH 9 g 0   ALPRAZolam (XANAX) 0.25 MG tablet Take 0.25 mg by mouth 2 (two) times daily. As needed (Patient not taking: Reported on 05/24/2022)     sertraline (ZOLOFT) 50 MG tablet Take 1 tablet (50 mg total) by mouth daily. (Patient not taking: Reported on 05/24/2022) 30 tablet 3   No current facility-administered medications for this visit.    Allergies  Allergen Reactions   Penicillins Other (See Comments)    Not sure if she has true allergy to Penicillin  but her father told her that she or one of her sisters had a reaction.    Diagnoses:  Adjustment disorder with depressed mood  Grief reaction  Plan of Care: ST: Pt will use a Notebook btwn sessions to record her thoughts/feelings & bring her issues to the therapy visit. Pt will try to give herself the time she needs to fully move through her grief & process the challenging Family issues now invl'g communication.   LT: Pt will address the Family dynamics she has been saddened by & use her childhood Hx to process these communication issues.    Donnetta Hutching, LMFT

## 2022-06-29 ENCOUNTER — Ambulatory Visit: Payer: BC Managed Care – PPO | Admitting: Behavioral Health

## 2022-07-10 ENCOUNTER — Encounter: Payer: Self-pay | Admitting: *Deleted

## 2022-07-26 ENCOUNTER — Ambulatory Visit: Payer: BC Managed Care – PPO | Admitting: Behavioral Health

## 2022-07-31 ENCOUNTER — Ambulatory Visit (INDEPENDENT_AMBULATORY_CARE_PROVIDER_SITE_OTHER): Payer: BC Managed Care – PPO | Admitting: Physician Assistant

## 2022-07-31 ENCOUNTER — Encounter: Payer: Self-pay | Admitting: Physician Assistant

## 2022-07-31 VITALS — BP 138/84 | HR 76 | Temp 98.0°F | Ht 61.0 in | Wt 173.8 lb

## 2022-07-31 DIAGNOSIS — I10 Essential (primary) hypertension: Secondary | ICD-10-CM

## 2022-07-31 NOTE — Progress Notes (Signed)
Subjective:    Patient ID: Debbie [name], female    DOB: Oct 30, 1970, 51 y.o.   MRN: [id number]  Chief Complaint  Patient presents with   Hypertension    Pt in for hypertension and bp check; pt is still checking bp at home and work and states it is all over the place;     Hypertension   Patient is in today for BP recheck. She has been checking at home, fluctuates often, but averages 130s/80s. No symptoms. Sometimes up and down with depression symptoms, but doing well at the time. Not taking any medications.  Past Medical History:  Diagnosis Date   Anemia    Dysmenorrhea    Headache(784.0)    Hypoglycemia    possible per pt- becomes shaky and weak when hungry    Past Surgical History:  Procedure Laterality Date   CESAREAN SECTION     X2   ROBOTIC ASSISTED TOTAL HYSTERECTOMY WITH BILATERAL SALPINGO OOPHERECTOMY Right 05/18/2015   Procedure: ROBOTIC ASSISTED LAPAROSCOPIC  TOTAL HYSTERECTOMY WITH RIGHT SALPINGECTOMY, LYSIS OF ADHESIONS, CYSTOSCOPY;  Surgeon: Lavonia Drafts, MD;  Location: [facility];  Service: Gynecology;  Laterality: Right;   TUBAL LIGATION      Family History  Problem Relation Age of Onset   Hypertension Mother    Diabetes Mother    Hypertension Father    Diabetes Father    Breast cancer Sister    Cancer Paternal Aunt 52       BREAST CANCER   Cancer Paternal Grandfather        prostate   [name] cancer Cousin    Cancer Cousin        [name]   Anesthesia problems Neg Hx     Social History   Tobacco Use   Smoking status: Never   Smokeless tobacco: Never  Vaping Use   Vaping Use: Never used  Substance Use Topics   Alcohol use: Yes    Comment: occ   Drug use: No     Allergies  Allergen Reactions   Penicillins Other (See Comments)    Not sure if she has true allergy to Penicillin but her father told her that she or one of her sisters had a reaction.    Review of Systems NEGATIVE UNLESS OTHERWISE INDICATED IN HPI       Objective:     BP 138/84 (BP Location: Left Arm)   Pulse 76   Temp 98 F (36.7 C) (Temporal)   Ht '5\' 1"'$  (1.549 m)   Wt 173 lb 12.8 oz (78.8 kg)   LMP  (LMP Unknown) Comment: pt on a pill to stop menses  SpO2 99%   BMI 32.84 kg/m   Wt Readings from Last 3 Encounters:  07/31/22 173 lb 12.8 oz (78.8 kg)  05/24/22 170 lb (77.1 kg)  03/27/22 171 lb (77.6 kg)    BP Readings from Last 3 Encounters:  07/31/22 138/84  05/24/22 (!) 144/88  03/27/22 128/86     Physical Exam Constitutional:      Appearance: Normal appearance.  Cardiovascular:     Rate and Rhythm: Normal rate and regular rhythm.     Pulses: Normal pulses.     Heart sounds: Normal heart sounds. No murmur heard. Pulmonary:     Effort: Pulmonary effort is normal.     Breath sounds: Normal breath sounds.  Neurological:     Mental Status: She is alert.  Psychiatric:        Mood and Affect: Mood  normal.        Behavior: Behavior normal.        Assessment & Plan:  Essential hypertension Assessment & Plan: Stable, to goal, managing without medication. Encouraged exercise, active lifestyle, good nutrition, AVS provided to help with keeping BP levels down.  Monitor at home Call if any changes       Return in about 10 months (around 06/01/2023) for CPE, fasting labs .      Debbie [name] M Debbie Kasper, PA-C

## 2022-07-31 NOTE — Assessment & Plan Note (Signed)
Stable, to goal, managing without medication. Encouraged exercise, active lifestyle, good nutrition, AVS provided to help with keeping BP levels down.  Monitor at home Call if any changes

## 2022-08-25 ENCOUNTER — Other Ambulatory Visit: Payer: Self-pay

## 2022-08-25 ENCOUNTER — Encounter (HOSPITAL_COMMUNITY): Payer: Self-pay

## 2022-08-25 ENCOUNTER — Telehealth: Payer: Self-pay | Admitting: Physician Assistant

## 2022-08-25 ENCOUNTER — Emergency Department (HOSPITAL_COMMUNITY)
Admission: EM | Admit: 2022-08-25 | Discharge: 2022-08-26 | Disposition: A | Discharge status: Home / Self Care | Payer: BC Managed Care – PPO | Attending: Emergency Medicine | Admitting: Emergency Medicine

## 2022-08-25 DIAGNOSIS — R200 Anesthesia of skin: Secondary | ICD-10-CM

## 2022-08-25 DIAGNOSIS — R202 Paresthesia of skin: Secondary | ICD-10-CM | POA: Diagnosis not present

## 2022-08-25 DIAGNOSIS — R072 Precordial pain: Secondary | ICD-10-CM | POA: Diagnosis not present

## 2022-08-25 DIAGNOSIS — R079 Chest pain, unspecified: Secondary | ICD-10-CM | POA: Diagnosis not present

## 2022-08-25 DIAGNOSIS — I10 Essential (primary) hypertension: Secondary | ICD-10-CM | POA: Diagnosis not present

## 2022-08-25 DIAGNOSIS — R0789 Other chest pain: Secondary | ICD-10-CM | POA: Diagnosis not present

## 2022-08-25 DIAGNOSIS — R5383 Other fatigue: Secondary | ICD-10-CM | POA: Diagnosis not present

## 2022-08-25 LAB — COMPREHENSIVE METABOLIC PANEL
ALT: 14 U/L (ref 0–44)
AST: 15 U/L (ref 15–41)
Albumin: 3.9 g/dL (ref 3.5–5.0)
Alkaline Phosphatase: 58 U/L (ref 38–126)
Anion gap: 6 (ref 5–15)
BUN: 15 mg/dL (ref 6–20)
CO2: 27 mmol/L (ref 22–32)
Calcium: 9.3 mg/dL (ref 8.9–10.3)
Chloride: 107 mmol/L (ref 98–111)
Creatinine, Ser: 0.49 mg/dL (ref 0.44–1.00)
GFR, Estimated: >60 mL/min (ref >60)
Glucose, Bld: 95 mg/dL (ref 70–99)
Potassium: 4.4 mmol/L (ref 3.5–5.1)
Sodium: 140 mmol/L (ref 135–145)
Total Bilirubin: 0.2 mg/dL — ABNORMAL LOW (ref 0.3–1.2)
Total Protein: 6.7 g/dL (ref 6.5–8.1)

## 2022-08-25 LAB — CBC
HCT: 40.3 % (ref 36.0–46.0)
Hemoglobin: 13.2 g/dL (ref 12.0–15.0)
MCH: 27.9 pg (ref 26.0–34.0)
MCHC: 32.8 g/dL (ref 30.0–36.0)
MCV: 85.2 fL (ref 80.0–100.0)
Platelets: 368 10*3/uL (ref 150–400)
RBC: 4.73 MIL/uL (ref 3.87–5.11)
RDW: 14.6 % (ref 11.5–15.5)
WBC: 10.3 10*3/uL (ref 4.0–10.5)
nRBC: 0 % (ref 0.0–0.2)

## 2022-08-25 LAB — URINALYSIS, ROUTINE W REFLEX MICROSCOPIC
Bacteria, UA: NONE SEEN
Bilirubin Urine: NEGATIVE
Glucose, UA: NEGATIVE mg/dL
Ketones, ur: NEGATIVE mg/dL
Leukocytes,Ua: NEGATIVE
Nitrite: NEGATIVE
Protein, ur: NEGATIVE mg/dL
Specific Gravity, Urine: 1.006 (ref 1.005–1.030)
pH: 7 (ref 5.0–8.0)

## 2022-08-25 LAB — D-DIMER, QUANTITATIVE: D-Dimer, Quant: <0.27 ug/mL-FEU (ref 0.00–0.50)

## 2022-08-25 LAB — LIPASE, BLOOD: Lipase: 37 U/L (ref 11–51)

## 2022-08-25 NOTE — ED Provider Triage Note (Signed)
Emergency Medicine Provider Triage Evaluation Note  Debbie [name] , a 51 y.o. female  was evaluated in triage.  Pt complains of right sided chest pain starting at 4am this morning. Worse with deep breathing. Also had some right neck and right shoulder pain associated. Noticed the backside of her right hand feels slightly numb. No hx of blood clots. No recent long travel or hospitalizations, not on OCPs.  Review of Systems  Positive: CP, SOB, headache, mild intermittent cough Negative: Abd pain, back pain, N/V  Physical Exam  BP (!) 175/95 (BP Location: Right Arm)   Pulse 83   Temp 98.4 F (36.9 C)   Resp 18   Ht '5\' 2"'$  (1.575 m)   Wt 78 kg   LMP  (LMP Unknown) Comment: pt on a pill to stop menses  SpO2 100%   BMI 31.46 kg/m  Gen:   Awake, no distress   Resp:  Normal effort  MSK:   Moves extremities without difficulty  Other:    Medical Decision Making  Medically screening exam initiated at 4:45 PM.  Appropriate orders placed.  Debbie [name] was informed that the remainder of the evaluation will be completed by another provider, this initial triage assessment does not replace that evaluation, and the importance of remaining in the ED until their evaluation is complete.  Workup initiated   Kateri Plummer, PA-C 08/25/22 1646

## 2022-08-25 NOTE — ED Triage Notes (Signed)
Pt reports feeling very tired, increased BP of 962 systolic associated with right sided chest pain that radiates to her right arm that started yesterday.

## 2022-08-25 NOTE — Telephone Encounter (Signed)
Please see pt call note, awaiting triage note

## 2022-08-25 NOTE — Telephone Encounter (Signed)
Pt sch fu for BP via mycahrt -  Patient was called:  Patient states she is having headaches and chest pain- reports high BP 140/90. Patient has been triaged. Awaiting notes.   Patients FU apt remains the same for 11/21 at 130pm .

## 2022-08-25 NOTE — ED Provider Notes (Signed)
[facility] Emergency Department Provider Note MRN:  [id number]  Arrival date & time: 08/26/22     Chief Complaint   Chest Pain   History of Present Illness   Debbie [name] is a 51 y.o. year-old female presents to the ED with chief complaint of right-sided arm numbness and tingling that extends into her right leg.  She also reports intermittent numbness and tingling to her right face.  She states that when she wakes up in the morning she feels out of breath.  She also complains of some right-sided chest pain.  She states that she has been having the symptoms for the past day or 2.  She denies weakness other than feeling fatigued.  She has been able to ambulate.  She denies fever, chills, or cough..  History provided by patient.   Review of Systems  Pertinent positive and negative review of systems noted in HPI.    Physical Exam   Vitals:   08/26/22 0230 08/26/22 0300  BP: 126/73 123/75  Pulse: 65 77  Resp: 15 17  Temp:    SpO2: 99% 99%    CONSTITUTIONAL:  non toxic-appearing, NAD NEURO:  Alert and oriented x 3, CN 3-12 intact, normal finger-nose, no pronator drift, subjective sensation deficit on the posterior right hand, 5/5 strength in upper and lower extremities EYES:  eyes equal and reactive ENT/NECK:  Supple, no stridor  CARDIO:  normal rate, regular rhythm, appears well-perfused  PULM:  No respiratory distress, CTAB GI/GU:  non-distended, non tender MSK/SPINE:  No gross deformities, no edema, moves all extremities  SKIN:  no rash, atraumatic   *Additional and/or pertinent findings included in MDM below  Diagnostic and Interventional Summary    EKG Interpretation  Date/Time:  Friday August 25 2022 16:23:15 EST Ventricular Rate:  79 PR Interval:  160 QRS Duration: 80 QT Interval:  368 QTC Calculation: 421 R Axis:   26 Text Interpretation: Normal sinus rhythm T wave abnormality, consider inferior ischemia  T wave changes new since  2022 Confirmed by Sherwood Gambler [phone]) on 08/25/2022 10:37:26 PM       Labs Reviewed  COMPREHENSIVE METABOLIC PANEL - Abnormal; Notable for the following components:      Result Value   Total Bilirubin 0.2 (*)    All other components within normal limits  URINALYSIS, ROUTINE W REFLEX MICROSCOPIC - Abnormal; Notable for the following components:   Color, Urine STRAW (*)    Hgb urine dipstick SMALL (*)    All other components within normal limits  LIPASE, BLOOD  CBC  D-DIMER, QUANTITATIVE  I-STAT BETA HCG BLOOD, ED ([facility], [facility], [facility] ONLY)  TROPONIN I (HIGH SENSITIVITY)  TROPONIN I (HIGH SENSITIVITY)    CT ANGIO HEAD NECK W WO CM  Final Result    DG Chest 2 View  Final Result      Medications  iohexol (OMNIPAQUE) 350 MG/ML injection 75 mL (75 mLs Intravenous Contrast Given 08/26/22 0213)     Procedures  /  Critical Care Procedures  ED Course and Medical Decision Making  I have reviewed the triage vital signs, the nursing notes, and pertinent available records from the EMR.  Social Determinants Affecting Complexity of Care: Patient has no clinically significant social determinants affecting this chief complaint..   ED Course:    Medical Decision Making Patient here with subjective paresthesias to right arm, sometimes involving the right leg.  She denies weakness/strength deficits. She does seem a bit anxious, could be anxiety driven, but  will check CT head and neck.    Labs ordered in triage are reassuring.  We discussed that given results and no focal weakness, less likely to be stroke.  Doubt ACS or PE.  Consider cervical radiculopathy or anxiety or other.  Will have patient follow-up with her PCP.  She understands and agrees with the plan.  Amount and/or Complexity of Data Reviewed Labs:     Details: No leukocytosis, no electrolyte derangement, d-dimer negative, doubt PE Radiology: ordered and independent interpretation performed.    Details: No ICH seen on head  CT  Risk Prescription drug management.     Consultants: No consultations were needed in caring for this patient.   Treatment and Plan: I considered admission due to patient's initial presentation, but after considering the examination and diagnostic results, patient will not require admission and can be discharged with outpatient follow-up.    Final Clinical Impressions(s) / ED Diagnoses     ICD-10-CM   1. Numbness and tingling in right hand  R20.0    R20.2     2. Precordial pain  R07.2       ED Discharge Orders     None         Discharge Instructions Discussed with and Provided to Patient:     Discharge Instructions      No certain cause of your symptoms was found, but it doesn't appear to be an emergent issue.  Please follow-up with your doctor on Monday.       Montine Circle, PA-C 08/26/22 0542    Merrily Pew, MD 08/26/22 [phone]

## 2022-08-25 NOTE — Telephone Encounter (Signed)
Patient Name: Debbie [name] Gender: Female DOB: 1971-09-13 Age: 51 Y 22 M 21 D Return Phone Number: [phone] (Primary), [phone] (Secondary), [phone] (Alternate) Address: City/ State/ Zip: [place]  [address] Client [facility] Client Site [place] [facility] Paediatric nurse, Freight forwarder- PA Contact Type Call Who Is Calling Patient / Member / Family / Caregiver Call Type Triage / Clinical Relationship To Patient Self Return Phone Number [phone] (Primary) Chief Complaint CHEST PAIN - pain, pressure, heaviness or tightness Reason for Call Symptomatic / Request for [place] states Raquel Sarna from [place] following up on a pt she has not heard back in 2 hours. Pt experiencing having headache and chest pain blood pressure of 140/90 Translation Yes Translation Language Spanish Nurse Assessment Nurse: Altamease Oiler, RN, Adriana Date/Time (Eastern Time): 08/25/2022 3:26:21 PM Confirm and document reason for call. If symptomatic, describe symptoms. ---pt states she has been monitoring her blood pressure. current blood pressure 142/90 at around 1300. 152/95. pt is not on blood pressure meds. pt reports fatigue Does the patient have any new or worsening symptoms? ---Yes Will a triage be completed? ---Yes Related visit to physician within the last 2 weeks? ---No Does the PT have any chronic conditions? (i.e. diabetes, asthma, this includes High risk factors for pregnancy, etc.) ---No Is the patient pregnant or possibly pregnant? (Ask all females between the ages of 19-55) ---No Is this a behavioral health or substance abuse call? ---No Guidelines Guideline Title Affirmed Question Affirmed Notes Nurse Date/Time (Eastern Time) Blood Pressure - High [1] Systolic BP >= 829 OR Diastolic >= 937 AND [1] cardiac (e.g., breathing Altamease Oiler, RN, Adriana 08/25/2022  3:29:39 PM   Guidelines Guideline Title Affirmed Question Affirmed Notes Nurse Date/Time (Eastern Time) difficulty, chest pain) or neurologic symptoms (e.g., new-onset blurred or double vision, unsteady gait) Disp. Time Eilene Ghazi Time) Disposition Final User 08/25/2022 3:08:32 PM Send to Urgent Elspeth Cho 08/25/2022 3:08:41 PM Send to Urgent Corliss Skains 08/25/2022 3:18:23 PM Attempt made - message left Tressia Danas, RN, [facility] 08/25/2022 3:19:59 PM Send To RN Personal Tressia Danas, RN, [facility] 08/25/2022 3:31:38 PM Go to ED Now Yes Altamease Oiler, RN, Adriana Final Disposition 08/25/2022 3:31:38 PM Go to ED Now Yes Altamease Oiler, RN, Adriana Caller Disagree/Comply Comply Caller Understands Yes PreDisposition Call Doctor Care Advice Given Per Guideline GO TO ED NOW: CARE ADVICE given per High Blood Pressure (Adult) guideline. NOTE TO TRIAGER - DRIVING: * Another adult should drive. Comments User: Kizzie Fantasia, RN Date/Time Eilene Ghazi Time): 08/25/2022 3:25:21 PM spoke to daughter on alternate number. i asked her to call pt and inform her i will be calling back. verbalizes understanding Referrals [facility] - ED

## 2022-08-26 ENCOUNTER — Emergency Department (HOSPITAL_COMMUNITY): Payer: BC Managed Care – PPO

## 2022-08-26 DIAGNOSIS — R079 Chest pain, unspecified: Secondary | ICD-10-CM | POA: Diagnosis not present

## 2022-08-26 DIAGNOSIS — R5383 Other fatigue: Secondary | ICD-10-CM | POA: Diagnosis not present

## 2022-08-26 DIAGNOSIS — I10 Essential (primary) hypertension: Secondary | ICD-10-CM | POA: Diagnosis not present

## 2022-08-26 LAB — TROPONIN I (HIGH SENSITIVITY): Troponin I (High Sensitivity): 3 ng/L (ref <18)

## 2022-08-26 MED ORDER — IOHEXOL 350 MG/ML SOLN
75.0000 mL | Freq: Once | INTRAVENOUS | Status: AC | PRN
  Administered 2022-08-26: 75 mL via INTRAVENOUS

## 2022-08-26 NOTE — ED Notes (Signed)
Pt to radiology.

## 2022-08-26 NOTE — ED Notes (Signed)
Pt ambulatory to restroom, tolerated well.

## 2022-08-26 NOTE — Discharge Instructions (Addendum)
No certain cause of your symptoms was found, but it doesn't appear to be an emergent issue.  Please follow-up with your doctor on Monday.

## 2022-08-28 ENCOUNTER — Ambulatory Visit: Payer: BC Managed Care – PPO | Admitting: Physician Assistant

## 2022-08-28 ENCOUNTER — Encounter: Payer: Self-pay | Admitting: Physician Assistant

## 2022-08-28 VITALS — BP 110/80 | HR 77 | Temp 97.5°F | Ht 62.0 in | Wt 173.0 lb

## 2022-08-28 DIAGNOSIS — I10 Essential (primary) hypertension: Secondary | ICD-10-CM

## 2022-08-28 DIAGNOSIS — M79601 Pain in right arm: Secondary | ICD-10-CM

## 2022-08-28 NOTE — Telephone Encounter (Signed)
Noted and agreed, thank you. 

## 2022-08-28 NOTE — Patient Instructions (Signed)
It was great to see you!  Your blood pressure goal is 140/90  Consider using your blood pressure machine from home at work to make sure you are getting accurate readings  If you develop chest pain, worsening symptoms, or other concerns, please call us  Take care,  Inda Coke PA-C

## 2022-08-28 NOTE — Progress Notes (Incomplete)
Debbie [name] is a 51 y.o. female here for a {New prob or follow up:31724}.  History of Present Illness:   No chief complaint on file.   HPI  Past Medical History:  Diagnosis Date   Anemia    Dysmenorrhea    Headache(784.0)    Hypoglycemia    possible per pt- becomes shaky and weak when hungry     Social History   Tobacco Use   Smoking status: Never   Smokeless tobacco: Never  Vaping Use   Vaping Use: Never used  Substance Use Topics   Alcohol use: Yes    Comment: occ   Drug use: No    Past Surgical History:  Procedure Laterality Date   CESAREAN SECTION     X2   ROBOTIC ASSISTED TOTAL HYSTERECTOMY WITH BILATERAL SALPINGO OOPHERECTOMY Right 05/18/2015   Procedure: ROBOTIC ASSISTED LAPAROSCOPIC  TOTAL HYSTERECTOMY WITH RIGHT SALPINGECTOMY, LYSIS OF ADHESIONS, CYSTOSCOPY;  Surgeon: Lavonia Drafts, MD;  Location: [facility];  Service: Gynecology;  Laterality: Right;   TUBAL LIGATION      Family History  Problem Relation Age of Onset   Hypertension Mother    Diabetes Mother    Hypertension Father    Diabetes Father    Breast cancer Sister    Cancer Paternal Aunt 27       BREAST CANCER   Cancer Paternal Grandfather        prostate   [name] cancer Cousin    Cancer Cousin        [name]   Anesthesia problems Neg Hx     Allergies  Allergen Reactions   Penicillins Other (See Comments)    Not sure if she has true allergy to Penicillin but her father told her that she or one of her sisters had a reaction.    Current Medications:   Current Outpatient Medications:    albuterol (PROVENTIL) (2.5 MG/3ML) 0.083% nebulizer solution, Take 3 mLs (2.5 mg total) by nebulization every 6 (six) hours as needed. (Patient not taking: Reported on 07/31/2022), Disp: 75 mL, Rfl: 0   albuterol (VENTOLIN HFA) 108 (90 Base) MCG/ACT inhaler, INHALE 2 PUFFS BY MOUTH EVERY 6 HOURS AS NEEDED FOR WHEEZING OR SHORTNESS OF BREATH (Patient not taking: Reported on  07/31/2022), Disp: 9 g, Rfl: 0   ALPRAZolam (XANAX) 0.25 MG tablet, Take 0.25 mg by mouth 2 (two) times daily. As needed (Patient not taking: Reported on 05/24/2022), Disp: , Rfl:    sertraline (ZOLOFT) 50 MG tablet, Take 1 tablet (50 mg total) by mouth daily. (Patient not taking: Reported on 05/24/2022), Disp: 30 tablet, Rfl: 3   Review of Systems:   ROS  Vitals:   There were no vitals filed for this visit.   There is no height or weight on file to calculate BMI.  Physical Exam:   Physical Exam  Assessment and Plan:   ***   I,Moesha Myer,acting as a scribe for Inda Coke, PA.,have documented all relevant documentation on the behalf of Inda Coke, PA,as directed by  Inda Coke, PA while in the presence of Inda Coke, [place].  ***

## 2022-08-28 NOTE — Progress Notes (Signed)
Debbie [name] is a 51 y.o. female here for a follow up of a pre-existing problem.  History of Present Illness:   Chief Complaint  Patient presents with   Follow-up    Pt here for ED f/u from 11/10. Numbness and Tingling right hand.    HPI  HTN She has a long history of HTN per patient Was on HCTZ 12.5 mg up until last November, was stopped due to normal readings by PCP She keeps a close eye on her BP at baseline On Friday (11/10), she endorsed HA, CP and BP of 142/90 -- she was sent to ER  At the ER she had a multitude of symptoms including SOB, R-sided arm numbness and tingling. She had thorough work-up of EKG, lipase, CBC, D-dimer, CMP. She had negative chest xray and CT angio head/neck.  She states that she had normal BP readings over the weekend. She typically checks her BP before she goes to work and if still elevated, she will check it at work. Sometimes she does not have time to check it at home, and can only check it at work. She checks it at the pharmacy at [facility] in a machine that you put your arm into. She is not sure the accuracy of this but does typically get more elevated readings with this.  Denies: caffeine, salt intake, current chest pain, exertional chest pain, LE swelling, DOE   R arm pain Developed R arm pain over the past two days. The numbness and tingling that she had resolved. Denies trauma to her arm, known injury. She has not taken anything for her symptoms. Denies issues with ROM.     Past Medical History:  Diagnosis Date   Anemia    Dysmenorrhea    Headache(784.0)    Hypoglycemia    possible per pt- becomes shaky and weak when hungry     Social History   Tobacco Use   Smoking status: Never   Smokeless tobacco: Never  Vaping Use   Vaping Use: Never used  Substance Use Topics   Alcohol use: Yes    Comment: occ   Drug use: No    Past Surgical History:  Procedure Laterality Date   CESAREAN SECTION     X2   ROBOTIC ASSISTED TOTAL  HYSTERECTOMY WITH BILATERAL SALPINGO OOPHERECTOMY Right 05/18/2015   Procedure: ROBOTIC ASSISTED LAPAROSCOPIC  TOTAL HYSTERECTOMY WITH RIGHT SALPINGECTOMY, LYSIS OF ADHESIONS, CYSTOSCOPY;  Surgeon: Lavonia Drafts, MD;  Location: [facility];  Service: Gynecology;  Laterality: Right;   TUBAL LIGATION      Family History  Problem Relation Age of Onset   Hypertension Mother    Diabetes Mother    Hypertension Father    Diabetes Father    Breast cancer Sister    Cancer Paternal Aunt 42       BREAST CANCER   Cancer Paternal Grandfather        prostate   [name] cancer Cousin    Cancer Cousin        [name]   Anesthesia problems Neg Hx     Allergies  Allergen Reactions   Penicillins Other (See Comments)    Not sure if she has true allergy to Penicillin but her father told her that she or one of her sisters had a reaction.    Current Medications:   Current Outpatient Medications:    albuterol (PROVENTIL) (2.5 MG/3ML) 0.083% nebulizer solution, Take 3 mLs (2.5 mg total) by nebulization every 6 (six) hours as needed.,  Disp: 75 mL, Rfl: 0   albuterol (VENTOLIN HFA) 108 (90 Base) MCG/ACT inhaler, INHALE 2 PUFFS BY MOUTH EVERY 6 HOURS AS NEEDED FOR WHEEZING OR SHORTNESS OF BREATH, Disp: 9 g, Rfl: 0   Review of Systems:   Review of Systems  Constitutional:  Negative for chills, fever, malaise/fatigue and weight loss.  HENT:  Negative for hearing loss, sinus pain and sore throat.   Respiratory:  Positive for shortness of breath. Negative for cough and hemoptysis.   Cardiovascular:  Negative for chest pain, palpitations, leg swelling and PND.  Gastrointestinal:  Negative for abdominal pain, constipation, diarrhea, heartburn, nausea and vomiting.  Genitourinary:  Negative for dysuria, frequency and urgency.  Musculoskeletal:  Negative for back pain, myalgias and neck pain.  Skin:  Negative for itching and rash.  Neurological:  Negative for dizziness, tingling, seizures and  headaches.  Endo/Heme/Allergies:  Negative for polydipsia.  Psychiatric/Behavioral:  Negative for depression. The patient is not nervous/anxious.     Vitals:   Vitals:   08/28/22 1311  BP: 110/80  Pulse: 77  Temp: (!) 97.5 F (36.4 C)  TempSrc: Temporal  SpO2: 98%  Weight: 173 lb (78.5 kg)  Height: '5\' 2"'$  (1.575 m)     Body mass index is 31.64 kg/m.  Physical Exam:   Physical Exam Vitals and nursing note reviewed.  Constitutional:      General: She is not in acute distress.    Appearance: Normal appearance. She is well-developed. She is not ill-appearing or toxic-appearing.  HENT:     Head: Normocephalic and atraumatic.  Eyes:     General: Lids are normal.     Extraocular Movements: Extraocular movements intact.     Conjunctiva/sclera: Conjunctivae normal.  Cardiovascular:     Rate and Rhythm: Normal rate and regular rhythm.     Pulses: Normal pulses.     Heart sounds: Normal heart sounds, S1 normal and S2 normal.  Pulmonary:     Effort: Pulmonary effort is normal.     Breath sounds: Normal breath sounds.  Musculoskeletal:        General: Normal range of motion.     Cervical back: Normal range of motion and neck supple.     Comments: TTP to mid humerus of R arm with no visible deformity Normal ROM of R arm  Skin:    General: Skin is warm and dry.  Neurological:     Mental Status: She is alert and oriented to person, place, and time.     GCS: GCS eye subscore is 4. GCS verbal subscore is 5. GCS motor subscore is 6.     Comments: Grip strength 5/5 b/l Normal perceived sensation to b/l arms/hands  Psychiatric:        Attention and Perception: Attention and perception normal.        Mood and Affect: Mood normal.        Speech: Speech normal.        Behavior: Behavior normal. Behavior is cooperative.        Thought Content: Thought content normal.        Judgment: Judgment normal.     Assessment and Plan:   Essential hypertension Normotensive in office No  evidence of end-organ damage Discussed JNC-8 guidelines of BP goal of < 140/90 Discussed future monitoring of BP with home device and to consider bringing home device to work in order to have more accurate readings at work, if she feels like she needs to continue to check this  regularly I do not feel like restarting her BP medication at this time would be beneficial She has follow-up with PCP on Thursday She declined further evaluation of this today  Right arm pain No red flags on exam Recommend close monitoring of symptoms and if persists > 2 weeks, to pursue imaging vs sports medicine referral Consider NSAIDs or tylenol  I,Moesha Myer,acting as a scribe for [employer], PA.,have documented all relevant documentation on the behalf of Inda Coke, PA,as directed by  Inda Coke, PA while in the presence of Inda Coke, [place].  I, Inda Coke, [place], have reviewed all documentation for this visit. The documentation on 08/28/22 for the exam, diagnosis, procedures, and orders are all accurate and complete.  Inda Coke PA-C

## 2022-08-31 ENCOUNTER — Encounter: Payer: Self-pay | Admitting: Physician Assistant

## 2022-08-31 ENCOUNTER — Ambulatory Visit: Payer: BC Managed Care – PPO | Admitting: Physician Assistant

## 2022-08-31 VITALS — BP 126/82 | HR 88 | Temp 97.8°F | Ht 62.0 in | Wt 172.4 lb

## 2022-08-31 DIAGNOSIS — F411 Generalized anxiety disorder: Secondary | ICD-10-CM

## 2022-08-31 DIAGNOSIS — I10 Essential (primary) hypertension: Secondary | ICD-10-CM | POA: Diagnosis not present

## 2022-08-31 DIAGNOSIS — R002 Palpitations: Secondary | ICD-10-CM

## 2022-08-31 MED ORDER — METOPROLOL TARTRATE 50 MG PO TABS: ORAL_TABLET | ORAL | 0 refills | Status: DC

## 2022-08-31 NOTE — Progress Notes (Signed)
Subjective:    Patient ID: Debbie [name], female    DOB: 1971-10-12, 51 y.o.   MRN: [id number]  Chief Complaint  Patient presents with   Hypertension    Pt in office for high bp; pt says bp been doing better, this am 131/89;     Hypertension   Patient is in today for recheck blood pressure.  Patient has a log of her blood pressure readings on her phone.  There is an obvious trend that her blood pressure readings are elevated when she is at work ranging from 140s to 160s over 90s.  Her blood pressure is normal on days she is not working.  She reports feeling some stress going into work.  She does feel her heart racing some days.  She denies any headache or chest pain.  No change in vision.  She is not taking any blood pressure medication at this time.  Blood pressure is normal in office today.  Past Medical History:  Diagnosis Date   Anemia    Dysmenorrhea    Headache(784.0)    Hypoglycemia    possible per pt- becomes shaky and weak when hungry    Past Surgical History:  Procedure Laterality Date   CESAREAN SECTION     X2   ROBOTIC ASSISTED TOTAL HYSTERECTOMY WITH BILATERAL SALPINGO OOPHERECTOMY Right 05/18/2015   Procedure: ROBOTIC ASSISTED LAPAROSCOPIC  TOTAL HYSTERECTOMY WITH RIGHT SALPINGECTOMY, LYSIS OF ADHESIONS, CYSTOSCOPY;  Surgeon: Lavonia Drafts, MD;  Location: [facility];  Service: Gynecology;  Laterality: Right;   TUBAL LIGATION      Family History  Problem Relation Age of Onset   Hypertension Mother    Diabetes Mother    Hypertension Father    Diabetes Father    Breast cancer Sister    Cancer Paternal Aunt 81       BREAST CANCER   Cancer Paternal Grandfather        prostate   [name] cancer Cousin    Cancer Cousin        [name]   Anesthesia problems Neg Hx     Social History   Tobacco Use   Smoking status: Never   Smokeless tobacco: Never  Vaping Use   Vaping Use: Never used  Substance Use Topics   Alcohol use: Yes    Comment:  occ   Drug use: No     Allergies  Allergen Reactions   Penicillins Other (See Comments)    Not sure if she has true allergy to Penicillin but her father told her that she or one of her sisters had a reaction.    Review of Systems NEGATIVE UNLESS OTHERWISE INDICATED IN HPI      Objective:     BP 126/82 (BP Location: Left Arm)   Pulse 88   Temp 97.8 F (36.6 C) (Temporal)   Ht '5\' 2"'$  (1.575 m)   Wt 172 lb 6.4 oz (78.2 kg)   LMP  (LMP Unknown) Comment: pt on a pill to stop menses  SpO2 98%   BMI 31.53 kg/m   Wt Readings from Last 3 Encounters:  08/31/22 172 lb 6.4 oz (78.2 kg)  08/28/22 173 lb (78.5 kg)  08/25/22 172 lb (78 kg)    BP Readings from Last 3 Encounters:  08/31/22 126/82  08/28/22 110/80  08/26/22 123/75     Physical Exam Constitutional:      Appearance: Normal appearance.  Cardiovascular:     Rate and Rhythm: Normal rate and regular rhythm.  Pulses: Normal pulses.     Heart sounds: No murmur heard. Pulmonary:     Effort: Pulmonary effort is normal.     Breath sounds: Normal breath sounds.  Neurological:     General: No focal deficit present.     Mental Status: She is alert and oriented to person, place, and time.  Psychiatric:        Mood and Affect: Mood normal.        Assessment & Plan:  Essential hypertension  Generalized anxiety disorder  Palpitations  Other orders -     Metoprolol Tartrate; Take one tablet approximately 30 minutes prior to your work shift.  Dispense: 30 tablet; Refill: 0   Had a good discussion today with the patient about her blood pressure readings and her symptoms.  Clearly work has an effect on increasing her symptoms and blood pressure.  We talked about options.  At this time will try to start on metoprolol tartrate 50 mg about 30 minutes prior to her work shift.  She will continue to log her blood pressure and heart rate.  She will let me know if this helps her symptoms.  She declined follow-up at this time  and said she would reach out via [place].  ER precautions discussed with patient.     Return if symptoms worsen or fail to improve.  This note was prepared with assistance of Systems analyst. Occasional wrong-word or sound-a-like substitutions may have occurred due to the inherent limitations of voice recognition software.     Olubunmi Rothenberger M Cheron Coryell, PA-C

## 2022-09-05 ENCOUNTER — Ambulatory Visit: Payer: BC Managed Care – PPO | Admitting: Physician Assistant

## 2022-09-27 ENCOUNTER — Other Ambulatory Visit: Payer: Self-pay | Admitting: Physician Assistant

## 2022-09-27 MED ORDER — METOPROLOL TARTRATE 50 MG PO TABS: ORAL_TABLET | ORAL | 0 refills | Status: DC

## 2022-10-26 ENCOUNTER — Other Ambulatory Visit: Payer: Self-pay | Admitting: Physician Assistant

## 2022-11-08 ENCOUNTER — Other Ambulatory Visit: Payer: Self-pay | Admitting: Physician Assistant

## 2022-11-08 DIAGNOSIS — Z1231 Encounter for screening mammogram for malignant neoplasm of breast: Secondary | ICD-10-CM

## 2022-11-22 ENCOUNTER — Encounter (INDEPENDENT_AMBULATORY_CARE_PROVIDER_SITE_OTHER): Payer: Self-pay

## 2022-12-29 ENCOUNTER — Ambulatory Visit
Admission: RE | Admit: 2022-12-29 | Discharge: 2022-12-29 | Disposition: A | Discharge status: Home / Self Care | Payer: BC Managed Care – PPO | Source: Ambulatory Visit

## 2022-12-29 DIAGNOSIS — Z1231 Encounter for screening mammogram for malignant neoplasm of breast: Secondary | ICD-10-CM | POA: Diagnosis not present

## 2023-01-05 ENCOUNTER — Ambulatory Visit: Payer: BC Managed Care – PPO | Admitting: Physician Assistant

## 2023-01-10 ENCOUNTER — Ambulatory Visit: Payer: BC Managed Care – PPO | Admitting: Physician Assistant

## 2023-01-10 VITALS — BP 140/90 | HR 80 | Temp 97.7°F | Resp 16 | Ht 62.0 in | Wt 176.6 lb

## 2023-01-10 DIAGNOSIS — I10 Essential (primary) hypertension: Secondary | ICD-10-CM

## 2023-01-10 DIAGNOSIS — M79602 Pain in left arm: Secondary | ICD-10-CM

## 2023-01-10 MED ORDER — METOPROLOL TARTRATE 50 MG PO TABS: ORAL_TABLET | ORAL | 1 refills | Status: DC

## 2023-01-10 NOTE — Patient Instructions (Signed)
It was great to see you!  Restart your metoprolol for your blood pressure  Keep an eye on your blood pressure and follow-up with Alyssa in 1-2 weeks to make sure you are on track  If blood pressure is consistently <150/90, you can take ibuprofen for your pain, otherwise stick to tylenol  A referral has been placed for you to see one of our fantastic providers at [facility]. Someone from their office will be in touch soon regarding scheduling your appointment.  Their location:  [place] [facility]  [address] on the 1st floor Phone number [phone] Fax [phone].   This location is across the street from the entrance to [employer] and in the same complex as the [facility]   Take care,  Inda Coke PA-C

## 2023-01-10 NOTE — Progress Notes (Signed)
Debbie [name] is a 52 y.o. female here for a new problem.  History of Present Illness:   Chief Complaint  Patient presents with   Arm Pain    Left arm, describes pain at constant shocking feeling, arm and hand feel very heavy Ongoing for 1 month    Hand Pain    Left Shoulder Pain Has had pain in her left shoulder and down her left arm for one month. Worse when lifting. 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 Feels like she does not have a strong grip in her hand  Hypertension Not taking her Lopressor due to running out without any refills. Blood pressure elevated at 161/103. Blood pressures measured at home are as follows: systolic as high as [id number] and diastolic as high as [id number]. Reports blood pressure running high for the past 2 days. Denies: HA, chest pain, LE swelling, SOB  Past Medical History:  Diagnosis Date   Anemia    Dysmenorrhea    Headache(784.0)    Hypoglycemia    possible per pt- becomes shaky and weak when hungry     Social History   Tobacco Use   Smoking status: Never   Smokeless tobacco: Never  Vaping Use   Vaping Use: Never used  Substance Use Topics   Alcohol use: Yes    Comment: occ   Drug use: No    Past Surgical History:  Procedure Laterality Date   CESAREAN SECTION     X2   ROBOTIC ASSISTED TOTAL HYSTERECTOMY WITH BILATERAL SALPINGO OOPHERECTOMY Right 05/18/2015   Procedure: ROBOTIC ASSISTED LAPAROSCOPIC  TOTAL HYSTERECTOMY WITH RIGHT SALPINGECTOMY, LYSIS OF ADHESIONS, CYSTOSCOPY;  Surgeon: Lavonia Drafts, MD;  Location: [facility];  Service: Gynecology;  Laterality: Right;   TUBAL LIGATION      Family History  Problem Relation Age of Onset   Hypertension Mother    Diabetes Mother    Hypertension Father    Diabetes Father    Breast cancer Sister    Cancer Paternal Aunt 31       BREAST CANCER    Cancer Paternal Grandfather        prostate   Colon cancer Cousin    Cancer Cousin        COLON.Marland Kitchen PATERNAL SIDE   Anesthesia problems Neg Hx     Allergies  Allergen Reactions   Penicillins Other (See Comments)    Not sure if she has true allergy to Penicillin but her father told her that she or one of her sisters had a reaction.    Current Medications:   Current Outpatient Medications:    albuterol (PROVENTIL) (2.5 MG/3ML) 0.083% nebulizer solution, Take 3 mLs (2.5 mg total) by nebulization every 6 (six) hours as needed. (Patient not taking: Reported on 01/10/2023), Disp: 75 mL, Rfl: 0   albuterol (VENTOLIN HFA) 108 (90 Base) MCG/ACT inhaler, INHALE 2 PUFFS BY MOUTH EVERY 6 HOURS AS NEEDED FOR WHEEZING OR SHORTNESS OF BREATH (Patient not taking: Reported on 01/10/2023), Disp: 9 g, Rfl: 0   metoprolol tartrate (LOPRESSOR) 50 MG tablet, TAKE 1 TABLET BY MOUTH ONCE DAILY APPROXIMATELY  30  MINUTES  PRIOR  TO  WORK  SHIFT, Disp: 30 tablet, Rfl: 1   Review of Systems:   Review of Systems  Constitutional:  Negative for fever and malaise/fatigue.  HENT:  Negative for congestion.   Eyes:  Negative for blurred vision.  Respiratory:  Negative for cough and shortness of breath.   Cardiovascular:  Negative for chest pain, palpitations and leg swelling.  Gastrointestinal:  Negative for vomiting.  Musculoskeletal:  Positive for joint pain (Left shoulder down left arm). Negative for back pain.  Skin:  Negative for rash.  Neurological:  Negative for loss of consciousness and headaches.    Vitals:   Vitals:   01/10/23 1137 01/10/23 1155  BP: (!) 161/103 (!) 140/90  Pulse: 80   Resp: 16   Temp: 97.7 F (36.5 C)   TempSrc: Temporal   SpO2: 100%   Weight: 176 lb 9.6 oz (80.1 kg)   Height: 5\' 2"  (1.575 m)      Body mass index is 32.3 kg/m.  Physical Exam:   Physical Exam Vitals and nursing note reviewed.  Constitutional:      General: She is not in acute distress.    Appearance:  She is well-developed. She is not ill-appearing or toxic-appearing.  Cardiovascular:     Rate and Rhythm: Normal rate and regular rhythm.     Pulses: Normal pulses.     Heart sounds: Normal heart sounds, S1 normal and S2 normal.  Pulmonary:     Effort: Pulmonary effort is normal.     Breath sounds: Normal breath sounds.  Musculoskeletal:     Comments: Left arm with tenderness to tricep/bicep area Pain elicited with resisted abduction and adduction Overall normal ROM  Skin:    General: Skin is warm and dry.  Neurological:     Mental Status: She is alert.     GCS: GCS eye subscore is 4. GCS verbal subscore is 5. GCS motor subscore is 6.     Comments: Grip strength 5/5  Psychiatric:        Speech: Speech normal.        Behavior: Behavior normal. Behavior is cooperative.     Assessment and Plan:   Left arm pain No red flags Suspect overuse injury to left upper arm/rotator cuff I recommend avoidance of repetitive tasks that are exacerbating her sx Note for light duty provided however will go ahead and refer to Sports Medicine so they can evaluate further and guide [place] on more specific lifting/work restrictions May continue tylenol If BP is consistently < 140/90, she can take ibuprofen   HTN Above goal On recheck however, much improved No obvious signs of end-organ damage on my exam Recommend restarting her metoprolol as prescribed and documenting BP, follow-up with PCP for further evaluation/mgmt   I,Alexander Ruley,acting as a scribe for [employer], PA.,have documented all relevant documentation on the behalf of Inda Coke, PA,as directed by  Inda Coke, PA while in the presence of Inda Coke, [place].  I, Inda Coke, [place], have reviewed all documentation for this visit. The documentation on 01/10/23 for the exam, diagnosis, procedures, and orders are all accurate and complete.   Inda Coke, PA-C

## 2023-01-18 NOTE — Progress Notes (Signed)
    Debbie [name] D.[name] [address] [place] [address] Phone: [phone]   Assessment and Plan:     1. Acute pain of left shoulder  -Acute, uncomplicated, initial sports medicine visit - Acute left shoulder pain with some qualities of subacromial bursitis and other qualities of biceps tendinitis.  Suspect likely flared by side sleeping versus physical activity at work - [employer] for rotator cuff - Start meloxicam 15 mg daily x2 weeks.  If still having pain after 2 weeks, complete 3rd-week of meloxicam. May use remaining meloxicam as needed once daily for pain control.  Do not to use additional NSAIDs while taking meloxicam.  May use Tylenol [phone] mg 2 to 3 times a day for breakthrough pain. - Recommend not lifting more than 15 pounds with left upper extremity for the next 4 weeks or until reevaluated.  Work note provided - No red flag symptoms on physical exam or HPI, so no imaging at today's visit  Pertinent previous records reviewed include none   Follow Up: 3 to 4 weeks for reevaluation.  If no improvement or worsening of symptoms, could consider physical therapy versus subacromial CSI   Subjective:   I, Debbie [name], am serving as a Neurosurgeon for Doctor Richardean Sale  Chief Complaint: left arm pain   HPI:   01/19/2023 Patient is a 52 year old female complaining of left arm pain. 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 Taking Tylenol with some relief.Feels like she does not have a strong grip in her hand, no numbness or tingling, decreased ROM ,no radiating pain   Relevant Historical Information: Hypertension  Additional pertinent review of systems negative.   Current Outpatient Medications:    albuterol (PROVENTIL) (2.5 MG/3ML) 0.083%  nebulizer solution, Take 3 mLs (2.5 mg total) by nebulization every 6 (six) hours as needed., Disp: 75 mL, Rfl: 0   albuterol (VENTOLIN HFA) 108 (90 Base) MCG/ACT inhaler, INHALE 2 PUFFS BY MOUTH EVERY 6 HOURS AS NEEDED FOR WHEEZING OR SHORTNESS OF BREATH, Disp: 9 g, Rfl: 0   meloxicam (MOBIC) 15 MG tablet, Take 1 tablet (15 mg total) by mouth daily., Disp: 30 tablet, Rfl: 0   metoprolol tartrate (LOPRESSOR) 50 MG tablet, TAKE 1 TABLET BY MOUTH ONCE DAILY APPROXIMATELY  30  MINUTES  PRIOR  TO  WORK  SHIFT, Disp: 30 tablet, Rfl: 1   Objective:     Vitals:   01/19/23 1035  BP: 138/88  Pulse: 87  SpO2: 97%  Weight: 177 lb (80.3 kg)  Height: 5\' 2"  (1.575 m)      Body mass index is 32.37 kg/m.    Physical Exam:    Gen: Appears well, nad, nontoxic and pleasant Neuro:sensation intact, strength is 5/5 with df/pf/inv/ev, muscle tone wnl Skin: no suspicious lesion or defmority Psych: A&O, appropriate mood and affect  Left shoulder:  No deformity, swelling or muscle wasting No scapular winging FF 180, abd 180, int 10, ext 90 NTTP over the [place], clavicle, ac, coracoid, biceps groove, humerus, deltoid, trapezius, cervical spine Positive O'Brien, Speed Neg neer, hawkins, empty can,  , crossarm, subscap liftoff,   Neg ant drawer, sulcus sign, apprehension Negative Spurling's test bilat FROM of neck    Electronically signed by:  Debbie [name] D.[name] 10:48 AM 01/19/23

## 2023-01-19 ENCOUNTER — Ambulatory Visit: Payer: BC Managed Care – PPO | Admitting: Sports Medicine

## 2023-01-19 VITALS — BP 138/88 | HR 87 | Ht 62.0 in | Wt 177.0 lb

## 2023-01-19 DIAGNOSIS — M25512 Pain in left shoulder: Secondary | ICD-10-CM | POA: Diagnosis not present

## 2023-01-19 MED ORDER — MELOXICAM 15 MG PO TABS: 15.0000 mg | ORAL_TABLET | Freq: Every day | ORAL | 0 refills | Status: DC

## 2023-01-19 NOTE — Patient Instructions (Addendum)
Good to see you  Shoulder HEP - Start meloxicam 15 mg daily x2 weeks.  If still having pain after 2 weeks, complete 3rd-week of meloxicam. May use remaining meloxicam as needed once daily for pain control.  Do not to use additional NSAIDs while taking meloxicam.  May use Tylenol [phone] mg 2 to 3 times a day for breakthrough pain. Work note provided no lifting more than 15 lbs with left arm 4 weeks  3-4 week follow up

## 2023-01-24 ENCOUNTER — Ambulatory Visit: Payer: BC Managed Care – PPO | Admitting: Physician Assistant

## 2023-02-06 ENCOUNTER — Ambulatory Visit: Payer: BC Managed Care – PPO | Admitting: Family

## 2023-02-06 ENCOUNTER — Encounter: Payer: Self-pay | Admitting: Family

## 2023-02-06 VITALS — BP 141/93 | HR 92 | Temp 98.0°F | Ht 62.0 in | Wt 176.6 lb

## 2023-02-06 DIAGNOSIS — U071 COVID-19: Secondary | ICD-10-CM

## 2023-02-06 LAB — POC COVID19 BINAXNOW: SARS Coronavirus 2 Ag: POSITIVE — AB

## 2023-02-06 MED ORDER — NIRMATRELVIR/RITONAVIR (PAXLOVID)TABLET: 3.0000 | ORAL_TABLET | Freq: Two times a day (BID) | ORAL | 0 refills | Status: AC

## 2023-02-06 NOTE — Progress Notes (Signed)
Patient ID: Debbie [name], female    DOB: September 21, 1971, 52 y.o.   MRN: [id number]  Chief Complaint  Patient presents with   Sinus Problem    Pt c/o cough, sore throat and fever on Saturday. Has tried dayquil/Nyquil and tylenol. Daughter positive for covid yesterday.     HPI:      URI sx:  Pt c/o cough, runny nose, congestion, sore throat and fever on Saturday. Has tried dayquil/Nyquil and tylenol. Daughter positive for covid yesterday. Denies body aches or chills.  Assessment & Plan:  1. COVID-19 - sending Paxlovid, advised on use, how to take, & SE. Advised of CDC guidelines for staying home x 5d from onset of sx and masking if out in public. OK to continue taking OTC sinus or pain meds. Encouraged to monitor & notify office of any worsening symptoms: increased shortness of breath, weakness, and signs of dehydration. Instructed to rest and hydrate well.   - POC COVID-19 - nirmatrelvir/ritonavir (PAXLOVID) 20 x 150 MG & 10 x  TABS; Take 3 tablets by mouth 2 (two) times daily for 5 days. (Take nirmatrelvir 150 mg two tablets twice daily for 5 days and ritonavir 100 mg one tablet twice daily for 5 days)  Dispense: 30 tablet; Refill: 0  Subjective:    Outpatient Medications Prior to Visit  Medication Sig Dispense Refill   albuterol (PROVENTIL) (2.5 MG/3ML) 0.083% nebulizer solution Take 3 mLs (2.5 mg total) by nebulization every 6 (six) hours as needed. 75 mL 0   albuterol (VENTOLIN HFA) 108 (90 Base) MCG/ACT inhaler INHALE 2 PUFFS BY MOUTH EVERY 6 HOURS AS NEEDED FOR WHEEZING OR SHORTNESS OF BREATH 9 g 0   meloxicam (MOBIC) 15 MG tablet Take 1 tablet (15 mg total) by mouth daily. 30 tablet 0   metoprolol tartrate (LOPRESSOR) 50 MG tablet TAKE 1 TABLET BY MOUTH ONCE DAILY APPROXIMATELY  30  MINUTES  PRIOR  TO  WORK  SHIFT 30 tablet 1   No facility-administered medications prior to visit.   Past Medical History:  Diagnosis Date   Anemia    Dysmenorrhea    Headache(784.0)     Hypoglycemia    possible per pt- becomes shaky and weak when hungry   Past Surgical History:  Procedure Laterality Date   CESAREAN SECTION     X2   ROBOTIC ASSISTED TOTAL HYSTERECTOMY WITH BILATERAL SALPINGO OOPHERECTOMY Right 05/18/2015   Procedure: ROBOTIC ASSISTED LAPAROSCOPIC  TOTAL HYSTERECTOMY WITH RIGHT SALPINGECTOMY, LYSIS OF ADHESIONS, CYSTOSCOPY;  Surgeon: Willodean Rosenthal, MD;  Location: [facility];  Service: Gynecology;  Laterality: Right;   TUBAL LIGATION     Allergies  Allergen Reactions   Penicillins Other (See Comments)    Not sure if she has true allergy to Penicillin but her father told her that she or one of her sisters had a reaction.      Objective:    Physical Exam Vitals and nursing note reviewed.  Constitutional:      Appearance: Normal appearance. She is ill-appearing.     Interventions: Face mask in place.  HENT:     Right Ear: Tympanic membrane and ear canal normal.     Left Ear: Tympanic membrane and ear canal normal.     Nose:     Right Sinus: No frontal sinus tenderness.     Left Sinus: No frontal sinus tenderness.     Mouth/Throat:     Mouth: Mucous membranes are moist.     Pharynx: Posterior oropharyngeal erythema present.  No pharyngeal swelling, oropharyngeal exudate or uvula swelling.     Tonsils: No tonsillar exudate or tonsillar abscesses.  Cardiovascular:     Rate and Rhythm: Normal rate and regular rhythm.  Pulmonary:     Effort: Pulmonary effort is normal.     Breath sounds: Normal breath sounds.  Musculoskeletal:        General: Normal range of motion.  Lymphadenopathy:     Head:     Right side of head: No preauricular or posterior auricular adenopathy.     Left side of head: No preauricular or posterior auricular adenopathy.     Cervical: No cervical adenopathy.  Skin:    General: Skin is warm and dry.  Neurological:     Mental Status: She is alert.  Psychiatric:        Mood and Affect: Mood normal.        Behavior:  Behavior normal.    BP (!) 141/93 (BP Location: Left Arm, Patient Position: Sitting, Cuff Size: Large)   Pulse 92   Temp 98 F (36.7 C) (Temporal)   Ht  (1.575 m)   Wt 176 lb 9.6 oz (80.1 kg)   LMP  (LMP Unknown) Comment: pt on a pill to stop menses  SpO2 99%   BMI 32.30 kg/m  Wt Readings from Last 3 Encounters:  02/06/23 176 lb 9.6 oz (80.1 kg)  01/19/23 177 lb (80.3 kg)  01/10/23 176 lb 9.6 oz (80.1 kg)      Dulce Sellar, NP

## 2023-02-12 ENCOUNTER — Other Ambulatory Visit: Payer: Self-pay | Admitting: Sports Medicine

## 2023-02-16 ENCOUNTER — Ambulatory Visit: Payer: BC Managed Care – PPO | Admitting: Physician Assistant

## 2023-04-05 ENCOUNTER — Other Ambulatory Visit: Payer: Self-pay | Admitting: Physician Assistant

## 2023-04-13 ENCOUNTER — Ambulatory Visit: Payer: BC Managed Care – PPO | Admitting: Physician Assistant

## 2023-04-17 ENCOUNTER — Ambulatory Visit: Payer: BC Managed Care – PPO | Admitting: Family

## 2023-04-17 ENCOUNTER — Encounter: Payer: Self-pay | Admitting: Family

## 2023-04-17 VITALS — BP 129/85 | HR 66 | Temp 97.0°F | Ht 62.0 in | Wt 180.4 lb

## 2023-04-17 DIAGNOSIS — M5416 Radiculopathy, lumbar region: Secondary | ICD-10-CM | POA: Diagnosis not present

## 2023-04-17 DIAGNOSIS — M545 Low back pain, unspecified: Secondary | ICD-10-CM

## 2023-04-17 LAB — POCT URINALYSIS DIPSTICK
Bilirubin, UA: NEGATIVE
Blood, UA: POSITIVE — AB
Glucose, UA: NEGATIVE
Ketones, UA: NEGATIVE
Leukocytes, UA: NEGATIVE
Nitrite, UA: NEGATIVE
Protein, UA: NEGATIVE
Spec Grav, UA: >=1.03 — AB (ref 1.010–1.025)
Urobilinogen, UA: 0.2 E.U./dL
pH, UA: 6 (ref 5.0–8.0)

## 2023-04-17 MED ORDER — MELOXICAM 15 MG PO TABS: 15.0000 mg | ORAL_TABLET | Freq: Every day | ORAL | 0 refills | Status: DC

## 2023-04-17 MED ORDER — METHOCARBAMOL 500 MG PO TABS: 500.0000 mg | ORAL_TABLET | Freq: Four times a day (QID) | ORAL | 0 refills | Status: DC

## 2023-04-17 NOTE — Progress Notes (Signed)
Patient ID: Debbie [name], female    DOB: [birth year], 52 y.o.   MRN: [id number]  Chief Complaint  Patient presents with   Back Pain    Pt c/o severe right sided back and hip pain radiating down leg for 3 days. Has tried tylenol which did not help. Also tried meloxicam which did help slightly 4 am this monring. Pt states she had a dark urine this week.     HPI:      Back pain:    Pt c/o  right sided low back pain that radiates to her right hip & into her right pelvis and top of right thigh for 3 days. Pt denies pain moving around her waist to pelvis, but feels the pain in all areas.  She denies any tingling or numbness.  Has tried tylenol which did not help. Also tried meloxicam which did help slightly during the night. Pt states she has had dark urine this week, but denies hematuria or any other urinary sx. 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 She lifts a lot of heavy things with her job as well. Denies any past back pain or injury.  Assessment & Plan:  1. Right lumbar radiculopathy sending another refill of her Mobic, advised to take with Robaxin daily for the next week, advised on use & SE. Advised on no heavy lifting, straining, etc any activity that can aggravate the pain for the next 2w. Continue to apply ice for up to 30 min 2-3x/day for the next few days. Handout provided for stretches & exercises to do when pain is better to help prevent future episodes. Advised to call back if pain worsens or does not improve.  - meloxicam (MOBIC) 15 MG tablet; Take 1 tablet (15 mg total) by mouth daily.  Dispense: 30 tablet; Refill: 0 - methocarbamol (ROBAXIN) 500 MG tablet; Take 1-2 tablets (500-1,000 mg total) by mouth 4 (four) times daily.  Dispense: 30 tablet; Refill: 0  2. Low back pain, unspecified back pain laterality, unspecified chronicity, unspecified whether sciatica  present UA neg, small blood noted, pt reports darker color urine at home, advised on drinking at least 2 L water every day.  - POCT Urinalysis Dipstick   Subjective:    Outpatient Medications Prior to Visit  Medication Sig Dispense Refill   albuterol (PROVENTIL) (2.5 MG/3ML) 0.083% nebulizer solution Take 3 mLs (2.5 mg total) by nebulization every 6 (six) hours as needed. 75 mL 0   albuterol (VENTOLIN HFA) 108 (90 Base) MCG/ACT inhaler INHALE 2 PUFFS BY MOUTH EVERY 6 HOURS AS NEEDED FOR WHEEZING OR SHORTNESS OF BREATH 9 g 0   meloxicam (MOBIC) 15 MG tablet Take 1 tablet (15 mg total) by mouth daily. 30 tablet 0   metoprolol tartrate (LOPRESSOR) 50 MG tablet TAKE 1 TABLET BY MOUTH ONCE DAILY APPROXIMATELY 30 MINUTES PRIOR TO WORK SHIFT 30 tablet 0   No facility-administered medications prior to visit.   Past Medical History:  Diagnosis Date   Anemia    Dysmenorrhea    Headache(784.0)    Hypoglycemia    possible per pt- becomes shaky and weak when hungry   Past Surgical History:  Procedure Laterality Date   CESAREAN SECTION     X2   ROBOTIC ASSISTED TOTAL HYSTERECTOMY WITH BILATERAL SALPINGO OOPHERECTOMY Right 05/18/2015   Procedure: ROBOTIC ASSISTED LAPAROSCOPIC  TOTAL HYSTERECTOMY  WITH RIGHT SALPINGECTOMY, LYSIS OF ADHESIONS, CYSTOSCOPY;  Surgeon: Willodean Rosenthal, MD;  Location: [facility];  Service: Gynecology;  Laterality: Right;   TUBAL LIGATION     Allergies  Allergen Reactions   Penicillins Other (See Comments)    Not sure if she has true allergy to Penicillin but her father told her that she or one of her sisters had a reaction.      Objective:    Physical Exam Vitals and nursing note reviewed.  Constitutional:      Appearance: Normal appearance.  Cardiovascular:     Rate and Rhythm: Normal rate and regular rhythm.  Pulmonary:     Effort: Pulmonary effort is normal.     Breath sounds: Normal breath sounds.  Musculoskeletal:     Right hip: No tenderness or  bony tenderness. Decreased range of motion.  Skin:    General: Skin is warm and dry.  Neurological:     Mental Status: She is alert.  Psychiatric:        Mood and Affect: Mood normal.        Behavior: Behavior normal.    BP 129/85   Pulse 66   Temp (!) 97 F (36.1 C) (Temporal)   Ht 5\' 2"  (1.575 m)   Wt 180 lb 6.4 oz (81.8 kg)   LMP  (LMP Unknown) Comment: pt on a pill to stop menses  SpO2 100%   BMI 33.00 kg/m  Wt Readings from Last 3 Encounters:  04/17/23 180 lb 6.4 oz (81.8 kg)  02/06/23 176 lb 9.6 oz (80.1 kg)  01/19/23 177 lb (80.3 kg)      Dulce Sellar, NP

## 2023-04-17 NOTE — Patient Instructions (Signed)
It was very nice to see you today!  I have sent another refill of Meloxicam & a muscle relaxer (Robaxin) to your pharmacy. Continue to take the Meloxicam daily for 1 week and the Robaxin up to 3 times per day as needed to help the pain. The Robaxin may cause drowsiness, you can take 1-2 pills at a time if not too drowsy.  To prevent future Sciatic episodes:  Try to lose some weight. Extra weight puts pressure on the sciatic nerve.  Stay active, walking, or other exercise, and perform the stretches demonstrated in the handouts attached 1-2 times per day. Do not sit for long periods in a chair or long car rides. Stand up every - 1hour, walk around and stretch.  Let us know if you are not feeling better in another 1-2 weeks.      PLEASE NOTE:  If you had any lab tests please let us know if you have not heard back within a few days. You may see your results on MyChart before we have a chance to review them but we will give you a call once they are reviewed by [place]. If we ordered any referrals today, please let us know if you have not heard from their office within the next week.

## 2023-04-23 ENCOUNTER — Ambulatory Visit: Payer: BC Managed Care – PPO | Admitting: Physician Assistant

## 2023-04-26 ENCOUNTER — Other Ambulatory Visit: Payer: Self-pay

## 2023-04-26 ENCOUNTER — Emergency Department (HOSPITAL_BASED_OUTPATIENT_CLINIC_OR_DEPARTMENT_OTHER)
Admission: EM | Admit: 2023-04-26 | Discharge: 2023-04-26 | Disposition: A | Discharge status: Home / Self Care | Payer: BC Managed Care – PPO | Attending: Emergency Medicine | Admitting: Emergency Medicine

## 2023-04-26 ENCOUNTER — Emergency Department (HOSPITAL_BASED_OUTPATIENT_CLINIC_OR_DEPARTMENT_OTHER): Payer: BC Managed Care – PPO

## 2023-04-26 ENCOUNTER — Encounter (HOSPITAL_BASED_OUTPATIENT_CLINIC_OR_DEPARTMENT_OTHER): Payer: Self-pay | Admitting: Emergency Medicine

## 2023-04-26 ENCOUNTER — Ambulatory Visit: Payer: BC Managed Care – PPO | Admitting: Physician Assistant

## 2023-04-26 VITALS — BP 130/84 | HR 82 | Temp 98.0°F | Ht 62.0 in | Wt 179.2 lb

## 2023-04-26 DIAGNOSIS — R110 Nausea: Secondary | ICD-10-CM | POA: Insufficient documentation

## 2023-04-26 DIAGNOSIS — R1031 Right lower quadrant pain: Secondary | ICD-10-CM

## 2023-04-26 DIAGNOSIS — Z79899 Other long term (current) drug therapy: Secondary | ICD-10-CM | POA: Insufficient documentation

## 2023-04-26 DIAGNOSIS — I10 Essential (primary) hypertension: Secondary | ICD-10-CM | POA: Diagnosis not present

## 2023-04-26 HISTORY — DX: Essential (primary) hypertension: I10

## 2023-04-26 LAB — COMPREHENSIVE METABOLIC PANEL
ALT: 11 U/L (ref 0–44)
AST: 14 U/L — ABNORMAL LOW (ref 15–41)
Albumin: 4.4 g/dL (ref 3.5–5.0)
Alkaline Phosphatase: 55 U/L (ref 38–126)
Anion gap: 6 (ref 5–15)
BUN: 15 mg/dL (ref 6–20)
CO2: 29 mmol/L (ref 22–32)
Calcium: 9.7 mg/dL (ref 8.9–10.3)
Chloride: 103 mmol/L (ref 98–111)
Creatinine, Ser: 0.69 mg/dL (ref 0.44–1.00)
GFR, Estimated: >60 mL/min (ref >60)
Glucose, Bld: 97 mg/dL (ref 70–99)
Potassium: 4.3 mmol/L (ref 3.5–5.1)
Sodium: 138 mmol/L (ref 135–145)
Total Bilirubin: 0.4 mg/dL (ref 0.3–1.2)
Total Protein: 7.3 g/dL (ref 6.5–8.1)

## 2023-04-26 LAB — POC URINALSYSI DIPSTICK (AUTOMATED)
Bilirubin, UA: NEGATIVE
Blood, UA: POSITIVE
Glucose, UA: NEGATIVE
Ketones, UA: NEGATIVE
Leukocytes, UA: NEGATIVE
Nitrite, UA: NEGATIVE
Protein, UA: NEGATIVE
Spec Grav, UA: 1.015 (ref 1.010–1.025)
Urobilinogen, UA: 0.2 E.U./dL
pH, UA: 7 (ref 5.0–8.0)

## 2023-04-26 LAB — CBC
HCT: 41 % (ref 36.0–46.0)
Hemoglobin: 13.2 g/dL (ref 12.0–15.0)
MCH: 27.1 pg (ref 26.0–34.0)
MCHC: 32.2 g/dL (ref 30.0–36.0)
MCV: 84.2 fL (ref 80.0–100.0)
Platelets: 337 10*3/uL (ref 150–400)
RBC: 4.87 MIL/uL (ref 3.87–5.11)
RDW: 14.5 % (ref 11.5–15.5)
WBC: 11.4 10*3/uL — ABNORMAL HIGH (ref 4.0–10.5)
nRBC: 0 % (ref 0.0–0.2)

## 2023-04-26 LAB — LIPASE, BLOOD: Lipase: 19 U/L (ref 11–51)

## 2023-04-26 MED ORDER — ONDANSETRON HCL 4 MG/2ML IJ SOLN
4.0000 mg | Freq: Once | INTRAMUSCULAR | Status: AC | PRN
  Administered 2023-04-26: 4 mg via INTRAVENOUS
  Filled 2023-04-26: qty 2

## 2023-04-26 MED ORDER — SODIUM CHLORIDE 0.9 % IV BOLUS
1000.0000 mL | Freq: Once | INTRAVENOUS | Status: AC
  Administered 2023-04-26: 1000 mL via INTRAVENOUS

## 2023-04-26 MED ORDER — IOHEXOL 300 MG/ML  SOLN
100.0000 mL | Freq: Once | INTRAMUSCULAR | Status: AC | PRN
  Administered 2023-04-26: 85 mL via INTRAVENOUS

## 2023-04-26 MED ORDER — METHOCARBAMOL 500 MG PO TABS: 500.0000 mg | ORAL_TABLET | Freq: Two times a day (BID) | ORAL | 0 refills | Status: DC

## 2023-04-26 MED ORDER — ONDANSETRON 4 MG PO TBDP: 4.0000 mg | ORAL_TABLET | Freq: Three times a day (TID) | ORAL | 0 refills | Status: DC | PRN

## 2023-04-26 MED ORDER — MORPHINE SULFATE (PF) 4 MG/ML IV SOLN
4.0000 mg | Freq: Once | INTRAVENOUS | Status: AC
  Administered 2023-04-26: 4 mg via INTRAVENOUS
  Filled 2023-04-26: qty 1

## 2023-04-26 NOTE — ED Notes (Signed)
Pt verbalized understanding of d/c instructions, meds, and followup care. Denies questions. VSS, no distress noted. Steady gait to exit with all belongings.  ?

## 2023-04-26 NOTE — Progress Notes (Signed)
Subjective:    Patient ID: Debbie [name], female    DOB: Jul 10, 1971, 52 y.o.   MRN: [id number]  Chief Complaint  Patient presents with   Flank Pain    Pt in office with back pain and flank pain for the past week, pt states she has been using Meloxicam for pain but doesn't really help; pain feels like menstrual pain, no pain or burning when urinating    Flank Pain   Patient is in today for pain.  6 days ago started with pain in low back Next day started wrapping around to front of her abdomen, fronts of legs and pelvic area. Feels like cramping pain, but doesn't have periods. Some nausea.   One day her urine looked brown and had a different odor. No issues since then.  No fever or chills. Not sick feeling overall. Bowels normal.  Pain is progressively worsening daily. Meloxicam is helping minimally.   Past Medical History:  Diagnosis Date   Anemia    Dysmenorrhea    Headache(784.0)    Hypoglycemia    possible per pt- becomes shaky and weak when hungry    Past Surgical History:  Procedure Laterality Date   CESAREAN SECTION     X2   ROBOTIC ASSISTED TOTAL HYSTERECTOMY WITH BILATERAL SALPINGO OOPHERECTOMY Right 05/18/2015   Procedure: ROBOTIC ASSISTED LAPAROSCOPIC  TOTAL HYSTERECTOMY WITH RIGHT SALPINGECTOMY, LYSIS OF ADHESIONS, CYSTOSCOPY;  Surgeon: Willodean Rosenthal, MD;  Location: [facility];  Service: Gynecology;  Laterality: Right;   TUBAL LIGATION      Family History  Problem Relation Age of Onset   Hypertension Mother    Diabetes Mother    Hypertension Father    Diabetes Father    Breast cancer Sister    Cancer Paternal Aunt 44       BREAST CANCER   Cancer Paternal Grandfather        prostate   Colon cancer Cousin    Cancer Cousin        COLON.Marland Kitchen PATERNAL SIDE   Anesthesia problems Neg Hx     Social History   Tobacco Use   Smoking status: Never   Smokeless tobacco: Never  Vaping Use   Vaping status: Never Used  Substance Use Topics    Alcohol use: Yes    Comment: occ   Drug use: No     Allergies  Allergen Reactions   Penicillins Other (See Comments)    Not sure if she has true allergy to Penicillin but her father told her that she or one of her sisters had a reaction.    Review of Systems  Genitourinary:  Positive for flank pain.   NEGATIVE UNLESS OTHERWISE INDICATED IN HPI      Objective:     BP 130/84 (BP Location: Left Arm)   Pulse 82   Temp 98 F (36.7 C) (Temporal)   Ht 5\' 2"  (1.575 m)   Wt 179 lb 3.2 oz (81.3 kg)   LMP  (LMP Unknown) Comment: pt on a pill to stop menses  SpO2 99%   BMI 32.78 kg/m   Wt Readings from Last 3 Encounters:  04/26/23 179 lb 3.2 oz (81.3 kg)  04/17/23 180 lb 6.4 oz (81.8 kg)  02/06/23 176 lb 9.6 oz (80.1 kg)    BP Readings from Last 3 Encounters:  04/26/23 130/84  04/17/23 129/85  02/07/23 (!) 141/93     Physical Exam Vitals and nursing note reviewed.  Constitutional:      General: She  is not in acute distress.    Appearance: Normal appearance. She is not ill-appearing.  HENT:     Head: Normocephalic and atraumatic.  Cardiovascular:     Rate and Rhythm: Normal rate and regular rhythm.     Pulses: Normal pulses.     Heart sounds: Normal heart sounds.  Pulmonary:     Effort: Pulmonary effort is normal.     Breath sounds: Normal breath sounds.  Abdominal:     General: Abdomen is flat. Bowel sounds are normal.     Palpations: Abdomen is soft. There is no mass.     Tenderness: There is abdominal tenderness (RLQ). There is guarding. There is no right CVA tenderness or left CVA tenderness.  Skin:    General: Skin is warm and dry.  Neurological:     General: No focal deficit present.     Mental Status: She is alert.  Psychiatric:        Mood and Affect: Mood normal.        Assessment & Plan:  Right lower quadrant abdominal pain -     POCT Urinalysis Dipstick (Automated)   Very TTP to RLQ of abdomen on exam. UA +blood, otherwise negative. Ddx (not  an exhaustive list) includes appendicitis, colitis, cystitis, renal stone, pyelonephritis, etc. Recommend pt have further work-up with ED at this time. Directed her to [facility]. She is stable for discharge, will drive herself there now.    Reginaldo Hazard M Shelvy Perazzo, PA-C

## 2023-04-26 NOTE — ED Triage Notes (Signed)
Pt via pov from pcp with lower abdominal and flank pain (bilateral). Pt 's pcp sent her to rule out appendicitis. Pt endorses nausea, states she was told there was some blood in her urine. Pt alert & oriented, nad noted.

## 2023-04-26 NOTE — Patient Instructions (Signed)
Your abdominal pain needs further work-up with our wonderful emergency department team.  Please go now to:  [address], [place], [place] [address]

## 2023-04-26 NOTE — Discharge Instructions (Signed)
As we discussed, your workup in the ER today was reassuring for acute findings.  Laboratory evaluation and CT imaging did not reveal any emergent concerns.  Your symptoms could be muscular in nature and therefore I have given you a prescription for Robaxin which is a muscle relaxer for you to take as prescribed as needed.  Do not drive or operate heavy machinery while taking this medication as it can be sedating.  I have also given you a prescription for Zofran to cover for any residual nausea.  Follow-up with your primary care doctor at your earliest convenience.  Return if development of any new or worsening symptoms.

## 2023-04-26 NOTE — ED Provider Notes (Signed)
[place] EM[facility]GENC[facility] DEPARTMENT AT [facility] Provider Note   CSN: [id number] Arrival date & time: 04/26/23  1401     History  Chief Complaint  Patient presents with   Abdominal Pain    Debbie [name] is a 52 y.o. female.  Patient with history of hypertension presents today with complaints of abdominal pain.  She states that same began initially 1 week ago and has been persistent since then.  She states the pain is in her right lower quadrant and radiates to her right back.  She notes some nausea without vomiting or diarrhea.  Denies any history of similar symptoms previously.  She does note that she had a rectus sheath mass removed in 2023 and has not had any complications from this since then.  She denies any fevers or chills.  No urinary symptoms.  She has been taking meloxicam with some improvement.  She went to her PCP earlier today and was sent here for a CT scan to rule out appendicitis.   The history is provided by the patient. No language interpreter was used.  Abdominal Pain Associated symptoms: nausea        Home Medications Prior to Admission medications   Medication Sig Start Date End Date Taking? Authorizing Provider  albuterol (VENTOLIN HFA) 108 (90 Base) MCG/ACT inhaler INHALE 2 PUFFS BY MOUTH EVERY 6 HOURS AS NEEDED FOR WHEEZING OR SHORTNESS OF BREATH 05/10/22   Allwardt, Alyssa M, PA-C  meloxicam (MOBIC) 15 MG tablet Take 1 tablet (15 mg total) by mouth daily. 04/17/23   Dulce Sellar, NP  methocarbamol (ROBAXIN) 500 MG tablet Take 1-2 tablets (500-1,000 mg total) by mouth 4 (four) times daily. 04/17/23   Dulce Sellar, NP  metoprolol tartrate (LOPRESSOR) 50 MG tablet TAKE 1 TABLET BY MOUTH ONCE DAILY APPROXIMATELY 30 MINUTES PRIOR TO WORK SHIFT 04/05/23   Jarold Motto, PA      Allergies    Penicillins    Review of Systems   Review of Systems  Gastrointestinal:  Positive for abdominal pain and nausea.  All other systems reviewed  and are negative.   Physical Exam Updated Vital Signs BP (!) 133/92   Pulse 75   Temp 98.6 F (37 C) (Oral)   Resp 16   Ht 5\' 1"  (1.549 m)   Wt 77.1 kg   LMP  (LMP Unknown) Comment: pt on a pill to stop menses  SpO2 100%   BMI 32.12 kg/m  Physical Exam Vitals and nursing note reviewed.  Constitutional:      General: She is not in acute distress.    Appearance: Normal appearance. She is normal weight. She is not ill-appearing, toxic-appearing or diaphoretic.  HENT:     Head: Normocephalic and atraumatic.  Cardiovascular:     Rate and Rhythm: Normal rate.  Pulmonary:     Effort: Pulmonary effort is normal. No respiratory distress.  Abdominal:     General: Abdomen is flat.     Palpations: Abdomen is soft.     Tenderness: There is abdominal tenderness in the right lower quadrant.  Musculoskeletal:        General: Normal range of motion.     Cervical back: Normal range of motion.  Skin:    General: Skin is warm and dry.  Neurological:     General: No focal deficit present.     Mental Status: She is alert.  Psychiatric:        Mood and Affect: Mood normal.  Behavior: Behavior normal.     ED Results / Procedures / Treatments   Labs (all labs ordered are listed, but only abnormal results are displayed) Labs Reviewed  COMPREHENSIVE METABOLIC PANEL - Abnormal; Notable for the following components:      Result Value   AST 14 (*)    All other components within normal limits  CBC - Abnormal; Notable for the following components:   WBC 11.4 (*)    All other components within normal limits  LIPASE, BLOOD    EKG None  Radiology CT ABDOMEN PELVIS W CONTRAST  Result Date: 04/26/2023 CLINICAL DATA:  Right lower quadrant pain EXAM: CT ABDOMEN AND PELVIS WITH CONTRAST TECHNIQUE: Multidetector CT imaging of the abdomen and pelvis was performed using the standard protocol following bolus administration of intravenous contrast. RADIATION DOSE REDUCTION: This exam was  performed according to the departmental dose-optimization program which includes automated exposure control, adjustment of the mA and/or kV according to patient size and/or use of iterative reconstruction technique. CONTRAST:  85mL OMNIPAQUE IOHEXOL 300 MG/ML  SOLN COMPARISON:  09/02/2021 FINDINGS: Lower chest: No acute abnormality Hepatobiliary: No focal hepatic abnormality. Gallbladder unremarkable. Pancreas: No focal abnormality or ductal dilatation. Spleen: No focal abnormality.  Normal size. Adrenals/Urinary Tract: No adrenal abnormality. No focal renal abnormality. No stones or hydronephrosis. Urinary bladder is unremarkable. Stomach/Bowel: Normal appendix. Stomach, large and small bowel grossly unremarkable. Vascular/Lymphatic: No evidence of aneurysm or adenopathy. Reproductive: Prior hysterectomy.  No adnexal masses. Other: No free fluid or free air. Musculoskeletal: No acute bony abnormality. IMPRESSION: No acute findings in the abdomen or pelvis. Normal appendix. Electronically Signed   By: Charlett Nose M.D.   On: 04/26/2023 20:11    Procedures Procedures    Medications Ordered in ED Medications  ondansetron (ZOFRAN) injection 4 mg (4 mg Intravenous Given 04/26/23 1455)  sodium chloride 0.9 % bolus 1,000 mL (1,000 mLs Intravenous New Bag/Given 04/26/23 1948)  morphine (PF) 4 MG/ML injection 4 mg (4 mg Intravenous Given 04/26/23 1946)  iohexol (OMNIPAQUE) 300 MG/ML solution 100 mL (85 mLs Intravenous Contrast Given 04/26/23 2000)    ED Course/ Medical Decision Making/ A&P                             Medical Decision Making Amount and/or Complexity of Data Reviewed Labs: ordered. Radiology: ordered.  Risk Prescription drug management.   This patient is a 52 y.o. female who presents to the ED for concern of abdominal pain, this involves an extensive number of treatment options, and is a complaint that carries with it a high risk of complications and morbidity. The emergent differential  diagnosis prior to evaluation includes, but is not limited to,  AAA, gastroenteritis, appendicitis, Bowel obstruction, Bowel perforation. Gastroparesis, DKA, Hernia, Inflammatory bowel disease, pancreatitis, volvulus.   This is not an exhaustive differential.   Past Medical History / Co-morbidities / Social History: history of hypertension  Additional history: Chart reviewed. Pertinent results include: Patient went to her primary doctor earlier today and was sent here for evaluation to rule out appendicitis.  Physical Exam: Physical exam performed. The pertinent findings include: RLQ TTP, right flank TTP  Lab Tests: I ordered, and personally interpreted labs.  The pertinent results include:  UA obtained with pcp, positive for blood, noninfectious.  WBC 11.4.   Imaging Studies: I ordered imaging studies including Ct abdomen pelvis. I independently visualized and interpreted imaging which showed   No acute findings in  the abdomen or pelvis.   Normal appendix.  I agree with the radiologist interpretation.   Medications: I ordered medication including morphine, zofran, fluids  for pain, nausea, dehydration. Reevaluation of the patient after these medicines showed that the patient resolved. I have reviewed the patients home medicines and have made adjustments as needed.   Disposition: After consideration of the diagnostic results and the patients response to treatment, I feel that emergency department workup does not suggest an emergent condition requiring admission or immediate intervention beyond what has been performed at this time. The plan is: Discharge with Robaxin and Zofran and close outpatient follow-up. Patient is nontoxic, nonseptic appearing, in no apparent distress.  Patient's pain and other symptoms adequately managed in emergency department.  Fluid bolus given.  Labs, imaging and vitals reviewed.  Patient does not meet the SIRS or Sepsis criteria.  On repeat exam patient does  not have a surgical abdomin and there are no peritoneal signs.  No indication of appendicitis, bowel obstruction, bowel perforation, cholecystitis, diverticulitis, PID or ectopic pregnancy. Symptoms could be muscular therefore will send for robaxin.  Patient advised not to drive or operate heavy machinery while taking this medication as it can be sedating.  Patient discharged home with symptomatic treatment and given strict instructions for follow-up with their primary care physician.  I have also discussed reasons to return immediately to the ER.  Patient expresses understanding and agrees with plan.  Patient discharged in stable condition.  Final Clinical Impression(s) / ED Diagnoses Final diagnoses:  Right lower quadrant abdominal pain    Rx / DC Orders ED Discharge Orders          Ordered    methocarbamol (ROBAXIN) 500 MG tablet  2 times daily        04/26/23 2106    ondansetron (ZOFRAN-ODT) 4 MG disintegrating tablet  Every 8 hours PRN        04/26/23 2106          An After Visit Summary was printed and given to the patient.     Vear Clock 04/26/23 2107    Cathren Laine, MD 04/26/23 2340

## 2023-05-02 ENCOUNTER — Ambulatory Visit: Payer: BC Managed Care – PPO | Admitting: Physician Assistant

## 2023-05-07 ENCOUNTER — Other Ambulatory Visit: Payer: Self-pay | Admitting: Physician Assistant

## 2023-05-11 ENCOUNTER — Other Ambulatory Visit: Payer: Self-pay | Admitting: Family

## 2023-05-11 DIAGNOSIS — M5416 Radiculopathy, lumbar region: Secondary | ICD-10-CM

## 2023-06-03 ENCOUNTER — Other Ambulatory Visit: Payer: Self-pay | Admitting: Physician Assistant

## 2023-06-04 ENCOUNTER — Ambulatory Visit: Payer: Self-pay

## 2023-06-04 NOTE — Telephone Encounter (Signed)
    Chief Complaint: Lower abdominal pain, seen in ED 04/26/23. Asking to be worked in as a new pt. Symptoms: Pain 4/10. "Pain is better, the medicine they gave me helps." Frequency: July Pertinent Negatives: Patient denies  Disposition: [] ED /[] Urgent Care (no appt availability in office) / [] Appointment(In office/virtual)/ []  [facility]/ [] Home Care/ [] Refused Recommended Disposition /[] [facility]/ [x]  Follow-up with PCP Additional Notes: Please advise pt.Next New Patient appointment is December. Will go to ED for worsening of symptoms.  Reason for Disposition  Abdominal pain is a chronic symptom (recurrent or ongoing AND present > 4 weeks)  Answer Assessment - Initial Assessment Questions 1. LOCATION: "Where does it hurt?"      Right side , lower 2. RADIATION: "Does the pain shoot anywhere else?" (e.g., chest, back)     Into back 3. ONSET: "When did the pain begin?" (e.g., minutes, hours or days ago)      1 month ago  ED 04/26/23 4. SUDDEN: "Gradual or sudden onset?"     Gradual 5. PATTERN "Does the pain come and go, or is it constant?"    - If it comes and goes: "How long does it last?" "Do you have pain now?"     (Note: Comes and goes means the pain is intermittent. It goes away completely between bouts.)    - If constant: "Is it getting better, staying the same, or getting worse?"      (Note: Constant means the pain never goes away completely; most serious pain is constant and gets worse.)      Comes and goes 6. SEVERITY: "How bad is the pain?"  (e.g., Scale 1-10; mild, moderate, or severe)    - MILD (1-3): Doesn't interfere with normal activities, abdomen soft and not tender to touch.     - MODERATE (4-7): Interferes with normal activities or awakens from sleep, abdomen tender to touch.     - SEVERE (8-10): Excruciating pain, doubled over, unable to do any normal activities.       Now - 4 7. RECURRENT SYMPTOM: "Have you ever had this type of stomach pain  before?" If Yes, ask: "When was the last time?" and "What happened that time?"      No 8. CAUSE: "What do you think is causing the stomach pain?"     Unsure 9. RELIEVING/AGGRAVATING FACTORS: "What makes it better or worse?" (e.g., antacids, bending or twisting motion, bowel movement)     Pills from ED 10. OTHER SYMPTOMS: "Do you have any other symptoms?" (e.g., back pain, diarrhea, fever, urination pain, vomiting)       Urinary frequency 11. PREGNANCY: "Is there any chance you are pregnant?" "When was your last menstrual period?"       No  Protocols used: Abdominal Pain - [facility]

## 2023-06-12 DIAGNOSIS — R109 Unspecified abdominal pain: Secondary | ICD-10-CM | POA: Diagnosis not present

## 2023-06-12 DIAGNOSIS — I10 Essential (primary) hypertension: Secondary | ICD-10-CM | POA: Diagnosis not present

## 2023-06-12 DIAGNOSIS — R7303 Prediabetes: Secondary | ICD-10-CM | POA: Diagnosis not present

## 2023-06-12 DIAGNOSIS — R232 Flushing: Secondary | ICD-10-CM | POA: Diagnosis not present

## 2023-06-12 DIAGNOSIS — R351 Nocturia: Secondary | ICD-10-CM | POA: Diagnosis not present

## 2023-06-12 DIAGNOSIS — E782 Mixed hyperlipidemia: Secondary | ICD-10-CM | POA: Diagnosis not present

## 2023-06-12 DIAGNOSIS — E559 Vitamin D deficiency, unspecified: Secondary | ICD-10-CM | POA: Diagnosis not present

## 2023-06-26 ENCOUNTER — Ambulatory Visit: Payer: BC Managed Care – PPO | Admitting: Physician Assistant

## 2023-06-26 DIAGNOSIS — K581 Irritable bowel syndrome with constipation: Secondary | ICD-10-CM | POA: Diagnosis not present

## 2023-06-26 DIAGNOSIS — E559 Vitamin D deficiency, unspecified: Secondary | ICD-10-CM | POA: Diagnosis not present

## 2023-06-26 DIAGNOSIS — E782 Mixed hyperlipidemia: Secondary | ICD-10-CM | POA: Diagnosis not present

## 2023-06-26 DIAGNOSIS — I10 Essential (primary) hypertension: Secondary | ICD-10-CM | POA: Diagnosis not present

## 2023-07-26 ENCOUNTER — Other Ambulatory Visit: Payer: Self-pay | Admitting: Physician Assistant

## 2023-07-26 MED ORDER — METOPROLOL TARTRATE 50 MG PO TABS: ORAL_TABLET | ORAL | 0 refills | Status: DC

## 2023-07-28 ENCOUNTER — Emergency Department (HOSPITAL_COMMUNITY): Payer: BC Managed Care – PPO

## 2023-07-28 ENCOUNTER — Emergency Department (HOSPITAL_COMMUNITY)
Admission: EM | Admit: 2023-07-28 | Discharge: 2023-07-28 | Disposition: A | Discharge status: Home / Self Care | Payer: BC Managed Care – PPO | Attending: Emergency Medicine | Admitting: Emergency Medicine

## 2023-07-28 ENCOUNTER — Encounter (HOSPITAL_COMMUNITY): Payer: Self-pay

## 2023-07-28 DIAGNOSIS — M542 Cervicalgia: Secondary | ICD-10-CM | POA: Diagnosis not present

## 2023-07-28 DIAGNOSIS — S4991XA Unspecified injury of right shoulder and upper arm, initial encounter: Secondary | ICD-10-CM | POA: Diagnosis not present

## 2023-07-28 DIAGNOSIS — S161XXA Strain of muscle, fascia and tendon at neck level, initial encounter: Secondary | ICD-10-CM | POA: Diagnosis not present

## 2023-07-28 DIAGNOSIS — Y92410 Unspecified street and highway as the place of occurrence of the external cause: Secondary | ICD-10-CM | POA: Diagnosis not present

## 2023-07-28 DIAGNOSIS — M79631 Pain in right forearm: Secondary | ICD-10-CM | POA: Diagnosis not present

## 2023-07-28 DIAGNOSIS — S59911A Unspecified injury of right forearm, initial encounter: Secondary | ICD-10-CM | POA: Diagnosis not present

## 2023-07-28 DIAGNOSIS — M79601 Pain in right arm: Secondary | ICD-10-CM | POA: Diagnosis not present

## 2023-07-28 DIAGNOSIS — S299XXA Unspecified injury of thorax, initial encounter: Secondary | ICD-10-CM | POA: Diagnosis not present

## 2023-07-28 MED ORDER — METHOCARBAMOL 500 MG PO TABS: 500.0000 mg | ORAL_TABLET | Freq: Three times a day (TID) | ORAL | 0 refills | Status: DC | PRN

## 2023-07-28 MED ORDER — IBUPROFEN 800 MG PO TABS
800.0000 mg | ORAL_TABLET | Freq: Once | ORAL | Status: AC
  Administered 2023-07-28: 800 mg via ORAL
  Filled 2023-07-28: qty 1

## 2023-07-28 MED ORDER — IBUPROFEN 800 MG PO TABS: 800.0000 mg | ORAL_TABLET | Freq: Four times a day (QID) | ORAL | 0 refills | Status: DC | PRN

## 2023-07-28 MED ORDER — ACETAMINOPHEN 500 MG PO TABS
1000.0000 mg | ORAL_TABLET | Freq: Once | ORAL | Status: AC
  Administered 2023-07-28: 1000 mg via ORAL
  Filled 2023-07-28: qty 2
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
## 2023-07-28 NOTE — ED Notes (Signed)
Patient transported to X-ray 
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
## 2023-07-30 ENCOUNTER — Other Ambulatory Visit: Payer: Self-pay | Admitting: Physician Assistant

## 2023-08-24 ENCOUNTER — Ambulatory Visit: Payer: BC Managed Care – PPO | Admitting: Physician Assistant

## 2023-08-24 ENCOUNTER — Encounter: Payer: Self-pay | Admitting: Physician Assistant

## 2023-08-24 VITALS — BP 136/87 | HR 79 | Temp 97.8°F | Ht 62.0 in | Wt 174.8 lb

## 2023-08-24 DIAGNOSIS — R002 Palpitations: Secondary | ICD-10-CM | POA: Diagnosis not present

## 2023-08-24 DIAGNOSIS — Z23 Encounter for immunization: Secondary | ICD-10-CM

## 2023-08-24 DIAGNOSIS — F419 Anxiety disorder, unspecified: Secondary | ICD-10-CM

## 2023-08-24 DIAGNOSIS — R350 Frequency of micturition: Secondary | ICD-10-CM | POA: Diagnosis not present

## 2023-08-24 LAB — POCT URINALYSIS DIPSTICK
Bilirubin, UA: NEGATIVE
Blood, UA: NEGATIVE
Glucose, UA: NEGATIVE
Ketones, UA: NEGATIVE
Leukocytes, UA: NEGATIVE
Nitrite, UA: NEGATIVE
Protein, UA: NEGATIVE
Spec Grav, UA: 1.01 (ref 1.010–1.025)
Urobilinogen, UA: 0.2 U/dL
pH, UA: 6 (ref 5.0–8.0)

## 2023-08-24 MED ORDER — METOPROLOL TARTRATE 50 MG PO TABS
ORAL_TABLET | ORAL | 3 refills | Status: DC
Start: 2023-08-24 — End: 2024-08-11

## 2023-08-24 MED ORDER — ALPRAZOLAM 0.25 MG PO TABS
0.2500 mg | ORAL_TABLET | Freq: Two times a day (BID) | ORAL | 1 refills | Status: DC | PRN
Start: 2023-08-24 — End: 2024-02-28
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
## 2023-08-24 NOTE — Patient Instructions (Signed)
VISIT SUMMARY:  During today's visit, we discussed your increased frequency of urination, feelings of being 'hyper' with elevated heart rates, and anxiety. We also addressed your need for a shingles vaccination. Your blood pressure was well-controlled during the visit, and we have made plans for further evaluation and management of your symptoms.  YOUR PLAN:  -POLYURIA: Polyuria means frequent urination. We did not find any signs of infection or diabetes in your urine test. We will schedule a physical and annual exam with fasting labs in the next few weeks to further evaluate this issue.  -HYPERTENSION/TACHYCARDIA: Hypertension means high blood pressure, and tachycardia means a fast heart rate. Your blood pressure was well-controlled today. We will refill your Metoprolol 50mg  prescription, which you should take 30 minutes before work.  -ANXIETY: Anxiety is a feeling of worry or fear. You reported occasional severe anxiety and a recent panic attack. We will prescribe a low-dose Xanax for you to use as needed during severe anxiety episodes.  -SHINGLES VACCINATION: Shingles is a viral infection that causes a painful rash. You will receive the first dose of the shingles vaccine today. Be prepared for potential side effects with the second dose.  INSTRUCTIONS:  Please return for your physical and annual exam in the next few weeks. We will conduct fasting labs at that time to further evaluate your symptoms.

## 2023-10-26 ENCOUNTER — Encounter: Payer: BC Managed Care – PPO | Admitting: Physician Assistant

## 2023-11-05 ENCOUNTER — Ambulatory Visit: Payer: BC Managed Care – PPO | Admitting: Physician Assistant

## 2023-11-05 VITALS — BP 132/76 | HR 62 | Temp 97.8°F | Ht 62.0 in | Wt 186.8 lb

## 2023-11-05 DIAGNOSIS — M2550 Pain in unspecified joint: Secondary | ICD-10-CM

## 2023-11-05 DIAGNOSIS — J4520 Mild intermittent asthma, uncomplicated: Secondary | ICD-10-CM | POA: Diagnosis not present

## 2023-11-05 MED ORDER — MELOXICAM 15 MG PO TABS
ORAL_TABLET | ORAL | 0 refills | Status: DC
Start: 2023-11-05 — End: 2024-02-28

## 2023-11-05 MED ORDER — ALBUTEROL SULFATE HFA 108 (90 BASE) MCG/ACT IN AERS
2.0000 | INHALATION_SPRAY | RESPIRATORY_TRACT | 0 refills | Status: AC | PRN
Start: 2023-11-05 — End: ?

## 2023-11-05 NOTE — Progress Notes (Signed)
Patient ID: Debbie [name], female    DOB: 05-20-1971, 53 y.o.   MRN: [id number]   Assessment & Plan:  Mild intermittent asthma without complication -     Albuterol Sulfate HFA; Inhale 2 puffs into the lungs every 4 (four) hours as needed for wheezing or shortness of breath.  Dispense: 9 g; Refill: 0  Polyarthralgia -     Meloxicam; Take 1 tab po daily as needed for arthritis pain flare.  Dispense: 30 tablet; Refill: 0    Assessment and Plan    Arthritis Intermittent knee and shoulder pain, managed with Meloxicam as needed. No current pain. Morning hand swelling noted by patient. -Continue Meloxicam as needed for arthritis pain flares. Pt aware of risks vs benefits and possible adverse reactions/ -Consider follow-up if pain persists for 5-7 days despite medication use.  Asthma Recent episode of throat pain and coughing, improved with use of an inhaler. -Refill inhaler prescription. -Continue to monitor symptoms and use inhaler as needed.  General Health Maintenance Annual physical due. -Schedule annual physical for April 2025.         Return in about 3 months (around 02/03/2024) for physical, fasting labs .    Subjective:    Chief Complaint  Patient presents with   Knee Pain    Pt in office c/o left knee pain and swelling for 3 days; denies fall or injury; Has happened in the past but got better; pt also request refill for inhaler due to cough    HPI Discussed the use of AI scribe software for clinical note transcription with the patient, who gave verbal consent to proceed.  History of Present Illness   The patient, with a history of arthritis, presents with intermittent left knee and shoulder pain. The knee pain has been a chronic issue, but has recently worsened. The shoulder pain started about two weeks ago. Both pains are managed with meloxicam, which the patient takes as needed for severe pain. The patient reports that the medication is effective and  the pain usually resolves within two days of taking the medication.  The patient also reports morning swelling and soreness in her fingers, which she attributes to arthritis. She has a prescription for an inhaler, which she used for a week when she had a sore throat and cough two weeks ago. The inhaler seemed to alleviate her symptoms. The patient does not use the inhaler regularly, but would like a refill for future use.  The patient also mentions that she has been feeling better overall, both physically and emotionally, compared to the previous year. She attributes this improvement to not paying attention to stressors at work and in her personal life.    Past Medical History:  Diagnosis Date   Anemia    Dysmenorrhea    Headache(784.0)    Hypertension    Hypoglycemia    possible per pt- becomes shaky and weak when hungry    Past Surgical History:  Procedure Laterality Date   ABDOMINAL HYSTERECTOMY     CESAREAN SECTION     X2   ROBOTIC ASSISTED TOTAL HYSTERECTOMY WITH BILATERAL SALPINGO OOPHERECTOMY Right 05/18/2015   Procedure: ROBOTIC ASSISTED LAPAROSCOPIC  TOTAL HYSTERECTOMY WITH RIGHT SALPINGECTOMY, LYSIS OF ADHESIONS, CYSTOSCOPY;  Surgeon: Willodean Rosenthal, MD;  Location: [facility];  Service: Gynecology;  Laterality: Right;   TUBAL LIGATION      Family History  Problem Relation Age of Onset   Hypertension Mother    Diabetes Mother    Hypertension Father  Diabetes Father    Breast cancer Sister    Cancer Paternal Aunt 27       BREAST CANCER   Cancer Paternal Grandfather        prostate   Colon cancer Cousin    Cancer Cousin        COLON.Marland Kitchen PATERNAL SIDE   Anesthesia problems Neg Hx     Social History   Tobacco Use   Smoking status: Never   Smokeless tobacco: Never  Vaping Use   Vaping status: Never Used  Substance Use Topics   Alcohol use: Yes    Comment: occ   Drug use: No     Allergies  Allergen Reactions   Penicillins Other (See Comments)    Not  sure if she has true allergy to Penicillin but her father told her that she or one of her sisters had a reaction.    Review of Systems NEGATIVE UNLESS OTHERWISE INDICATED IN HPI      Objective:     BP 132/76 (BP Location: Left Arm, Patient Position: Sitting, Cuff Size: Large)   Pulse 62   Temp 97.8 F (36.6 C) (Temporal)   Ht 5\' 2"  (1.575 m)   Wt 186 lb 12.8 oz (84.7 kg)   LMP  (LMP Unknown) Comment: pt on a pill to stop menses  SpO2 100%   BMI 34.17 kg/m   Wt Readings from Last 3 Encounters:  11/05/23 186 lb 12.8 oz (84.7 kg)  08/24/23 174 lb 12.8 oz (79.3 kg)  07/28/23 170 lb 6.7 oz (77.3 kg)    BP Readings from Last 3 Encounters:  11/05/23 132/76  08/24/23 136/87  07/28/23 (!) 154/105     Physical Exam Vitals and nursing note reviewed.  Constitutional:      Appearance: Normal appearance.  Cardiovascular:     Rate and Rhythm: Normal rate and regular rhythm.     Pulses: Normal pulses.     Heart sounds: Normal heart sounds. No murmur heard. Pulmonary:     Effort: Pulmonary effort is normal.     Breath sounds: Normal breath sounds.  Musculoskeletal:        General: No swelling or tenderness. Normal range of motion.     Right lower leg: No edema.     Left lower leg: No edema.  Skin:    General: Skin is warm.     Findings: No rash.  Neurological:     General: No focal deficit present.     Mental Status: She is alert and oriented to person, place, and time.  Psychiatric:        Mood and Affect: Mood normal.      Benna Arno M Guillaume Weninger, PA-C

## 2023-11-16 IMAGING — US US ABDOMEN LIMITED
1 series · 8 of 8 positions shown · non-contrast
Comparison: CT abdomen and pelvis 09/02/2021.

CLINICAL DATA: Abdominal mass.

EXAM:
ULTRASOUND ABDOMEN LIMITED

[Series 1: us abdomen limited · 0.23mm/px · 8 of 8 slices shown]
[im 1/8]
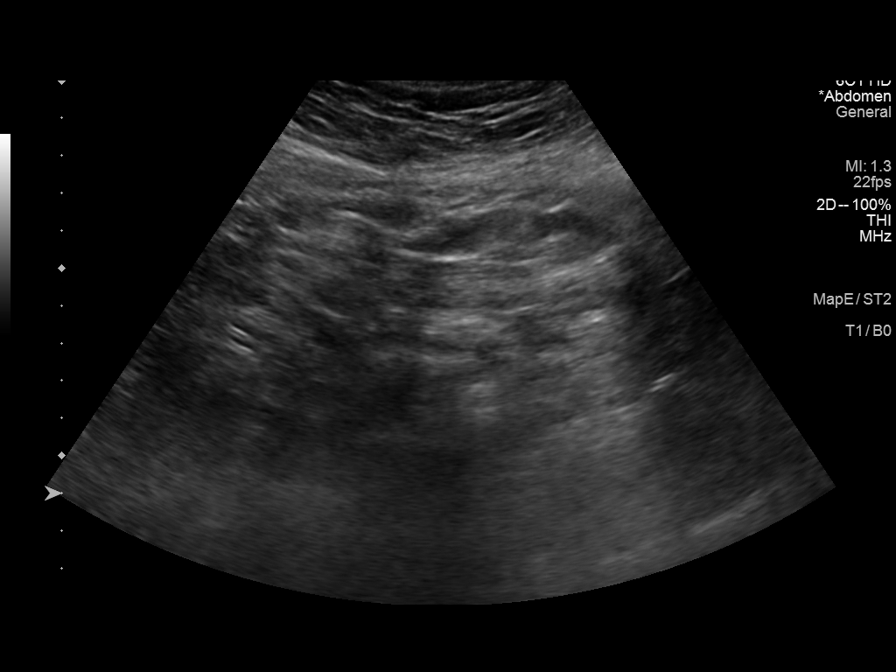
[im 2/8]
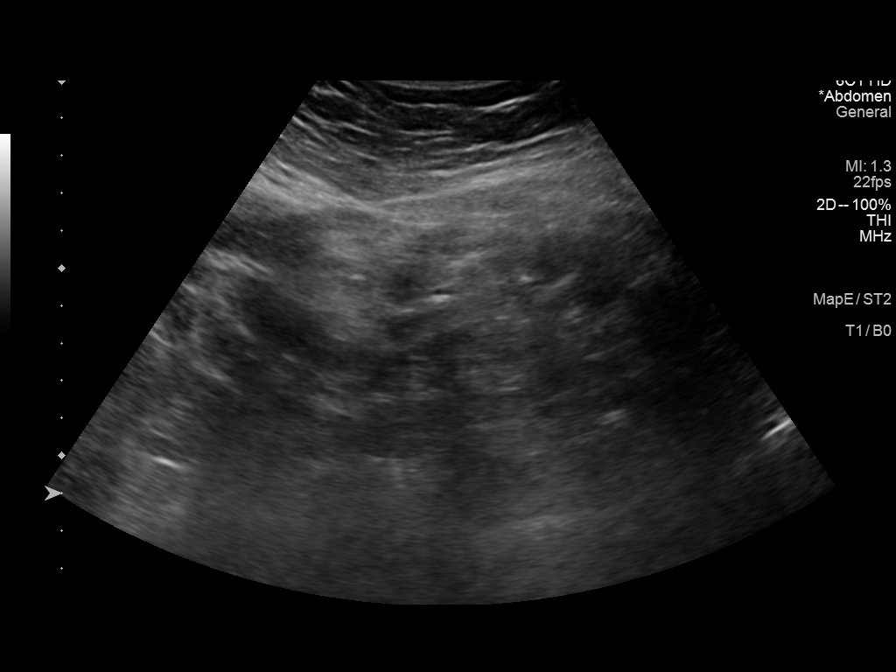
[im 3/8]
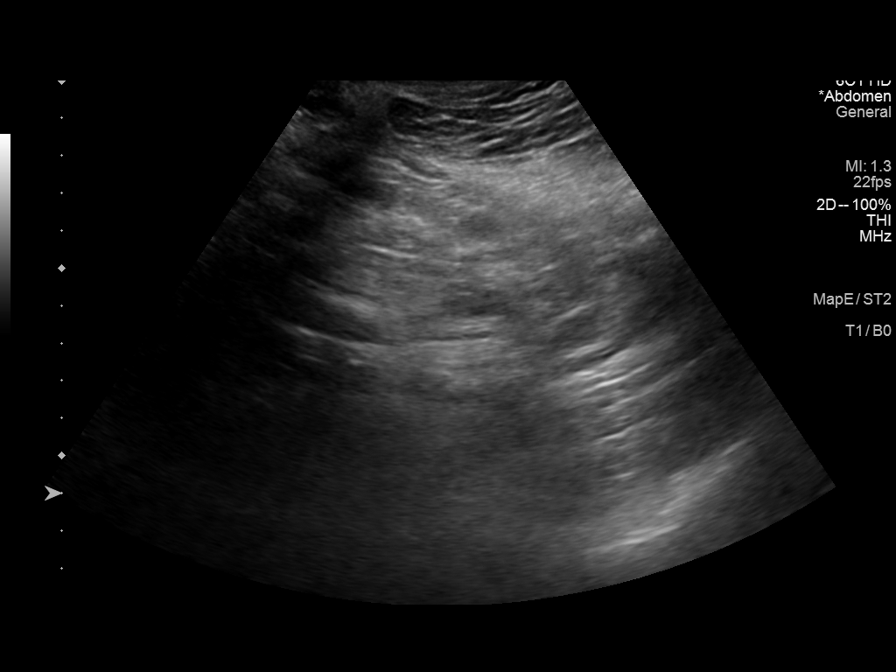
[im 4/8]
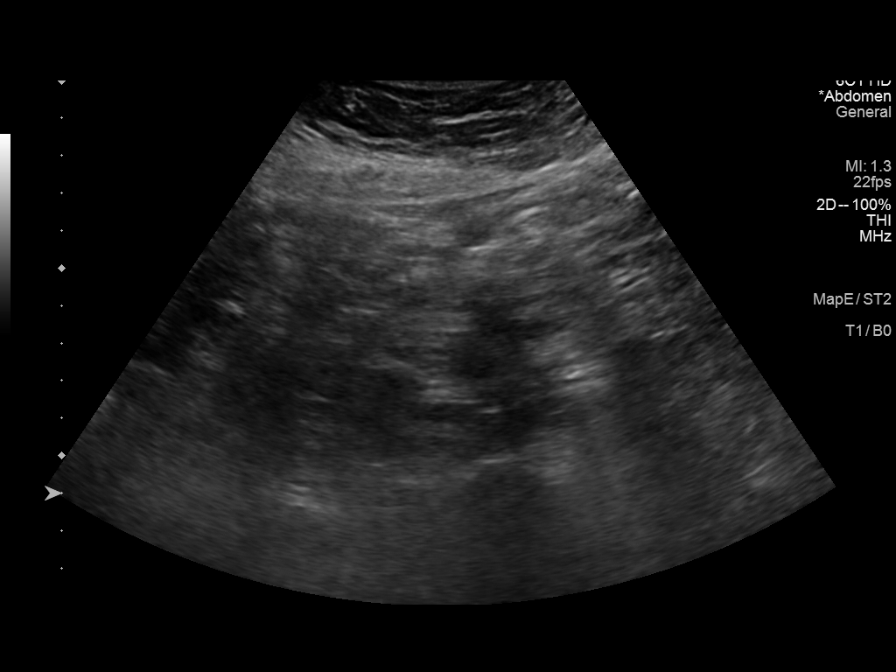
[im 5/8]
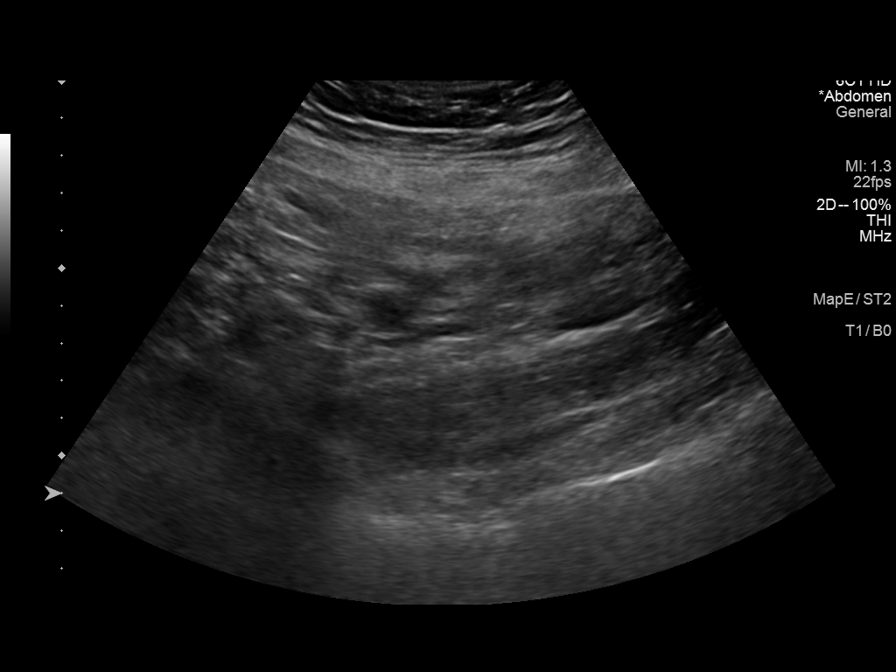
[im 6/8]
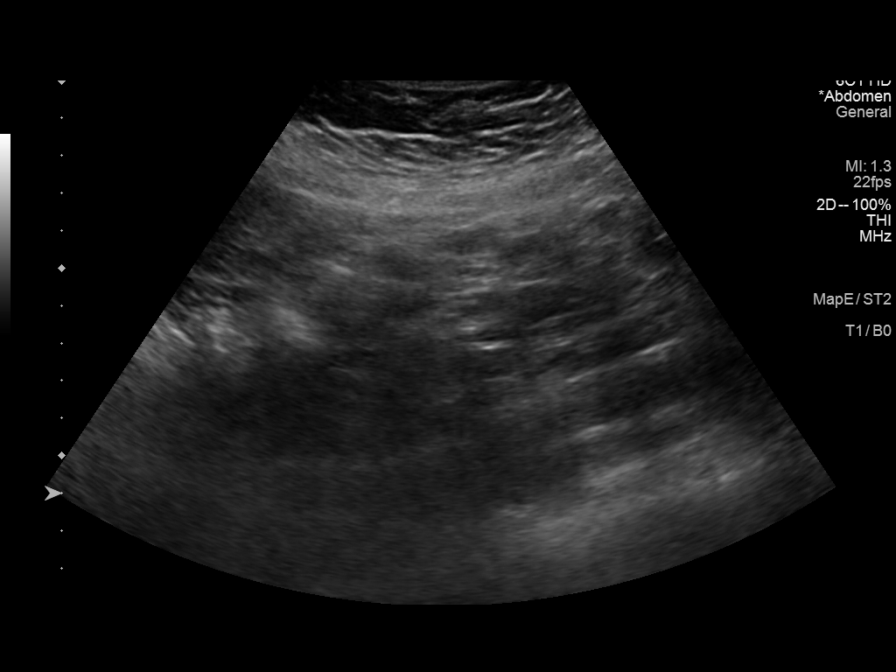
[im 7/8]
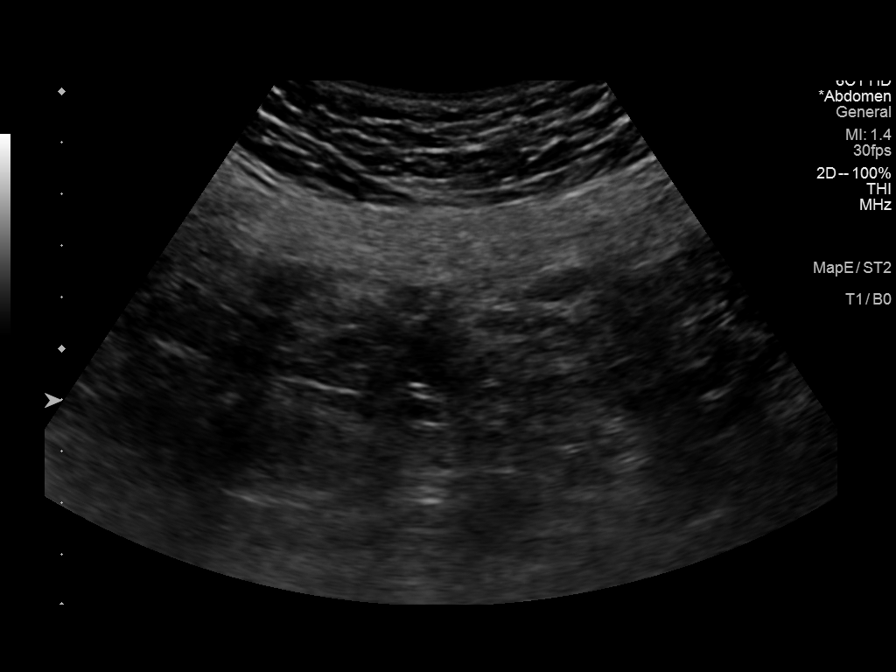
[im 8/8]
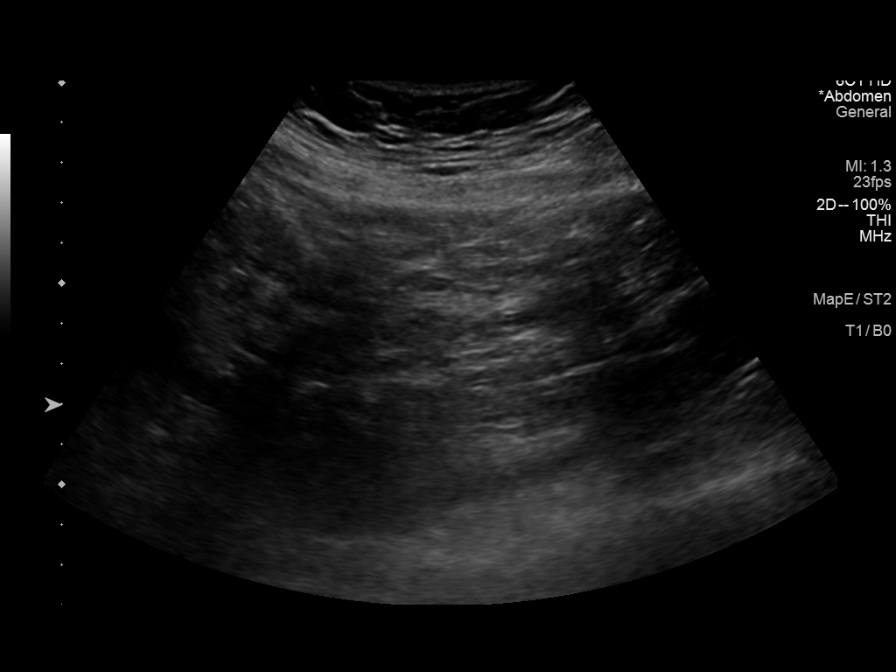

[8 of 8 positions shown; findings below may reference images not displayed]

FINDINGS: There is no evidence for solid or cystic mass, abnormal vascular
flow or hernia identified within the abdominal wall at the region in
question.
IMPRESSION: Negative.

## 2023-12-28 ENCOUNTER — Other Ambulatory Visit: Payer: Self-pay | Admitting: Physician Assistant

## 2023-12-28 DIAGNOSIS — Z0000 Encounter for general adult medical examination without abnormal findings: Secondary | ICD-10-CM

## 2024-01-01 ENCOUNTER — Ambulatory Visit
Admission: RE | Admit: 2024-01-01 | Discharge: 2024-01-01 | Disposition: A | Discharge status: Home / Self Care | Source: Ambulatory Visit | Attending: Physician Assistant

## 2024-01-01 DIAGNOSIS — Z0000 Encounter for general adult medical examination without abnormal findings: Secondary | ICD-10-CM

## 2024-01-02 ENCOUNTER — Ambulatory Visit: Admitting: Physician Assistant

## 2024-01-02 ENCOUNTER — Encounter: Payer: Self-pay | Admitting: Physician Assistant

## 2024-01-02 ENCOUNTER — Ambulatory Visit: Payer: Self-pay | Admitting: Physician Assistant

## 2024-01-02 VITALS — BP 120/86 | HR 68 | Temp 98.1°F | Ht 62.0 in | Wt 176.4 lb

## 2024-01-02 DIAGNOSIS — R051 Acute cough: Secondary | ICD-10-CM | POA: Diagnosis not present

## 2024-01-02 LAB — POC INFLUENZA A&B (BINAX/QUICKVUE)
Influenza A, POC: NEGATIVE
Influenza B, POC: NEGATIVE

## 2024-01-02 LAB — POC COVID19 BINAXNOW: SARS Coronavirus 2 Ag: NEGATIVE

## 2024-01-02 LAB — POCT RAPID STREP A (OFFICE): Rapid Strep A Screen: NEGATIVE

## 2024-01-02 MED ORDER — AZITHROMYCIN 250 MG PO TABS: ORAL_TABLET | ORAL | 0 refills | Status: AC

## 2024-01-02 MED ORDER — PREDNISONE 20 MG PO TABS: 20.0000 mg | ORAL_TABLET | Freq: Two times a day (BID) | ORAL | 0 refills | Status: DC

## 2024-01-02 NOTE — Telephone Encounter (Signed)
 Chief Complaint: cough Symptoms: sore throat, cough, SOB and chest pain after coughing episodes, body aches, chills Frequency: since 12/28/23 Pertinent Negatives: Patient denies fever, recent travel. Disposition: [] ED /[] Urgent Care (no appt availability in office) / [x] Appointment(In office/virtual)/ []  [facility]/ [] Home Care/ [] Refused Recommended Disposition /[] [place] [facility]/ []  Follow-up with PCP Additional Notes: Triage limited, patient declined Spanish interpreter, has cough drop in her mouth and background noise due to she is on her work head set. Patient states symptoms started on Friday with a sore throat and coughing. She states she had difficulty sleeping last night with sweats. Patient states she feels the cough has worsened today compared to Friday. Patient agreeable to acute visit today with available provider, discussed arriving 15 minutes early for check in. Patient verbalizes understanding to call back for worsening or new symptoms.  Copied from CRM [id number]. Topic: Clinical - Red Word Triage >> Jan 02, 2024  7:51 AM Theodis Sato wrote: Red Word that prompted transfer to Nurse Triage: Shortness of breath *Declined spanish interpreter.* Reason for Disposition  SEVERE coughing spells (e.g., whooping sound after coughing, vomiting after coughing)  Answer Assessment - Initial Assessment Questions 1. ONSET: "When did the cough begin?"      Friday 12/28/23, worsened.  2. SEVERITY: "How bad is the cough today?"      5/10, she states she is using cough drops and having to drink water all the time. She states her face turns after coughing spells.  3. SPUTUM: "Describe the color of your sputum" (none, dry cough; clear, white, yellow, green)     Dry, denies coughing up mucus.  4. HEMOPTYSIS: "Are you coughing up any blood?" If so ask: "How much?" (flecks, streaks, tablespoons, etc.)     Denies.  5. DIFFICULTY BREATHING: "Are you having difficulty breathing?" If  Yes, ask: "How bad is it?" (e.g., mild, moderate, severe)    - MILD: No SOB at rest, mild SOB with walking, speaks normally in sentences, can lie down, no retractions, pulse < 100.    - MODERATE: SOB at rest, SOB with minimal exertion and prefers to sit, cannot lie down flat, speaks in phrases, mild retractions, audible wheezing, pulse 100-120.    - SEVERE: Very SOB at rest, speaks in single words, struggling to breathe, sitting hunched forward, retractions, pulse > 120      SOB after coughing fits.  6. FEVER: "Do you have a fever?" If Yes, ask: "What is your temperature, how was it measured, and when did it start?"     Denies.  7. CARDIAC HISTORY: "Do you have any history of heart disease?" (e.g., heart attack, congestive heart failure)      Denies.  8. LUNG HISTORY: "Do you have any history of lung disease?"  (e.g., pulmonary embolus, asthma, emphysema)     Patient initially answered no to this question then later states she has asthma.  9. PE RISK FACTORS: "Do you have a history of blood clots?" (or: recent major surgery, recent prolonged travel, bedridden)     N/A.  10. OTHER SYMPTOMS: "Do you have any other symptoms?" (e.g., runny nose, wheezing, chest pain)       Sore throat, vomiting mucus x1 yesterday  11. PREGNANCY: "Is there any chance you are pregnant?" "When was your last menstrual period?"       N/A.  12. TRAVEL: "Have you traveled out of the country in the last month?" (e.g., travel history, exposures)       Declines  travel. She states her granddaughter was sick recently, unsure what she had.  Protocols used: Cough - Acute Non-Productive-A-AH

## 2024-01-02 NOTE — Progress Notes (Signed)
 Debbie [name] is a 53 y.o. female here for a new problem.  History of Present Illness:   Chief Complaint  Patient presents with   Cough    Pt c/o cough, sore throat, chills, body aches, started on Friday.   Cough  Patient complains of a cough and SOB that has persisted since this past Friday. Associated symptoms include sore throat, body aches, chills, sputum production and ear pain. Patient reports a history of asthma. She has been using her albuterol as needed. Most recent use was at 7 am this morning.   Denies any fever. No recent travel but does states that her granddaughter is currently sick with a viral sickness.  Covid, strep, and flu test in office today are negative.  Past Medical History:  Diagnosis Date   Anemia    Dysmenorrhea    Headache(784.0)    Hypertension    Hypoglycemia    possible per pt- becomes shaky and weak when hungry     Social History   Tobacco Use   Smoking status: Never   Smokeless tobacco: Never  Vaping Use   Vaping status: Never Used  Substance Use Topics   Alcohol use: Yes    Comment: occ   Drug use: No    Past Surgical History:  Procedure Laterality Date   ABDOMINAL HYSTERECTOMY     CESAREAN SECTION     X2   ROBOTIC ASSISTED TOTAL HYSTERECTOMY WITH BILATERAL SALPINGO OOPHERECTOMY Right 05/18/2015   Procedure: ROBOTIC ASSISTED LAPAROSCOPIC  TOTAL HYSTERECTOMY WITH RIGHT SALPINGECTOMY, LYSIS OF ADHESIONS, CYSTOSCOPY;  Surgeon: Willodean Rosenthal, MD;  Location: [facility];  Service: Gynecology;  Laterality: Right;   TUBAL LIGATION      Family History  Problem Relation Age of Onset   Hypertension Mother    Diabetes Mother    Hypertension Father    Diabetes Father    Breast cancer Sister 15   Breast cancer Paternal Aunt 34   Prostate cancer Paternal Grandfather    Colon cancer Cousin        paternal   Anesthesia problems Neg Hx     Allergies  Allergen Reactions   Penicillins Other (See Comments)    Not sure if  she has true allergy to Penicillin but her father told her that she or one of her sisters had a reaction.    Current Medications:   Current Outpatient Medications:    albuterol (VENTOLIN HFA) 108 (90 Base) MCG/ACT inhaler, Inhale 2 puffs into the lungs every 4 (four) hours as needed for wheezing or shortness of breath., Disp: 9 g, Rfl: 0   ALPRAZolam (XANAX) 0.25 MG tablet, Take 1 tablet (0.25 mg total) by mouth 2 (two) times daily as needed for anxiety., Disp: 30 tablet, Rfl: 1   meloxicam (MOBIC) 15 MG tablet, Take 1 tab po daily as needed for arthritis pain flare., Disp: 30 tablet, Rfl: 0   metoprolol tartrate (LOPRESSOR) 50 MG tablet, TAKE 1 TABLET BY MOUTH ONCE DAILY 30  MINUTES  PRIOR  TO  WORK  SHIFT, Disp: 90 tablet, Rfl: 3   Review of Systems:   Review of Systems  Constitutional:  Positive for chills and malaise/fatigue. Negative for fever.  HENT:  Positive for sore throat.   Respiratory:  Positive for cough, sputum production and shortness of breath.     Vitals:   Vitals:   01/02/24 1041  BP: 120/86  Pulse: 68  Temp: 98.1 F (36.7 C)  TempSrc: Temporal  SpO2: 99%  Weight: 176  lb 6.1 oz (80 kg)  Height: 5\' 2"  (1.575 m)     Body mass index is 32.26 kg/m.  Physical Exam:   Physical Exam Vitals and nursing note reviewed.  Constitutional:      General: She is not in acute distress.    Appearance: She is well-developed. She is not ill-appearing or toxic-appearing.  HENT:     Head: Normocephalic and atraumatic.     Right Ear: Tympanic membrane, ear canal and external ear normal. Tympanic membrane is not erythematous, retracted or bulging.     Left Ear: Tympanic membrane, ear canal and external ear normal. Tympanic membrane is not erythematous, retracted or bulging.     Nose: Nose normal.     Right Sinus: No maxillary sinus tenderness or frontal sinus tenderness.     Left Sinus: No maxillary sinus tenderness or frontal sinus tenderness.     Mouth/Throat:      Pharynx: Uvula midline. No posterior oropharyngeal erythema.  Eyes:     General: Lids are normal.     Conjunctiva/sclera: Conjunctivae normal.  Neck:     Trachea: Trachea normal.  Cardiovascular:     Rate and Rhythm: Normal rate and regular rhythm.     Heart sounds: Normal heart sounds, S1 normal and S2 normal.  Pulmonary:     Effort: Pulmonary effort is normal.     Breath sounds: Wheezing (very mild) present. No decreased breath sounds, rhonchi or rales.  Lymphadenopathy:     Cervical: No cervical adenopathy.  Skin:    General: Skin is warm and dry.  Neurological:     Mental Status: She is alert.  Psychiatric:        Speech: Speech normal.        Behavior: Behavior normal. Behavior is cooperative.    Results for orders placed or performed in visit on 01/02/24  POC Influenza A&B(BINAX/QUICKVUE)  Result Value Ref Range   Influenza A, POC Negative Negative   Influenza B, POC Negative Negative  POCT rapid strep A  Result Value Ref Range   Rapid Strep A Screen Negative Negative  POC COVID-19  Result Value Ref Range   SARS Coronavirus 2 Ag Negative Negative     Assessment and Plan:   Acute cough No red flags on exam.   Suspect viral upper respiratory infection (URI) with asthma exacerbation Start oral prednisone for asthma Discussed progression of most viral illness, and recommended supportive care at this point in time. I did however provide pocket rx for oral azithromycin should symptoms not improve as anticipated. Discussed over the counter supportive care options, with recommendations to push fluids and rest. Reviewed return precautions including new/worsening fever, SOB, new/worsening cough or other concerns.  Recommended need to self-quarantine and practice social distancing until symptoms resolve. Discussed current recommendations for COVID testing. I recommend that patient follow-up if symptoms worsen or persist despite treatment x 7-10 days, sooner if  needed.    Jarold Motto, PA-C  I,Safa M Kadhim,acting as a scribe for Jarold Motto, PA.,have documented all relevant documentation on the behalf of Jarold Motto, PA,as directed by  Jarold Motto, PA while in the presence of Jarold Motto, [place].   I, Jarold Motto, [place], have reviewed all documentation for this visit. The documentation on 01/02/24 for the exam, diagnosis, procedures, and orders are all accurate and complete.

## 2024-01-02 NOTE — Telephone Encounter (Signed)
 Noted.

## 2024-01-03 ENCOUNTER — Encounter: Payer: Self-pay | Admitting: Physician Assistant

## 2024-01-08 ENCOUNTER — Ambulatory Visit: Admitting: Physician Assistant

## 2024-01-13 IMAGING — MG MM DIGITAL SCREENING BILAT W/ TOMO AND CAD
8 series · 8 of 24 positions shown · non-contrast
Comparison: Previous exam(s).

CLINICAL DATA: Screening.

EXAM:
DIGITAL SCREENING BILATERAL MAMMOGRAM WITH TOMOSYNTHESIS AND CAD
TECHNIQUE: Bilateral screening digital craniocaudal and mediolateral oblique
mammograms were obtained. Bilateral screening digital breast
tomosynthesis was performed. The images were evaluated with
computer-aided detection.

[L CC synth-2D]
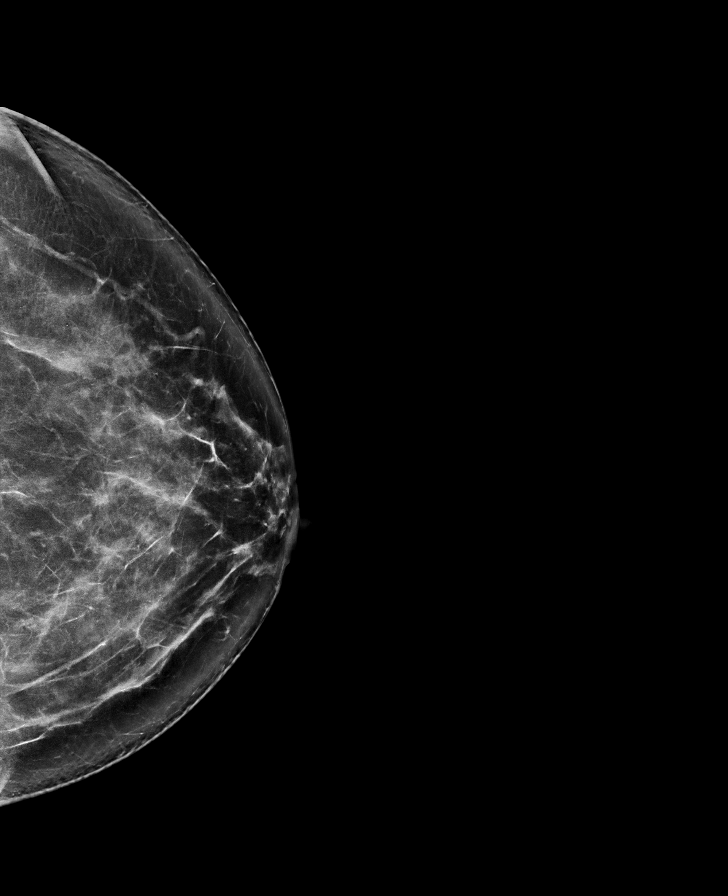

[R CC synth-2D]
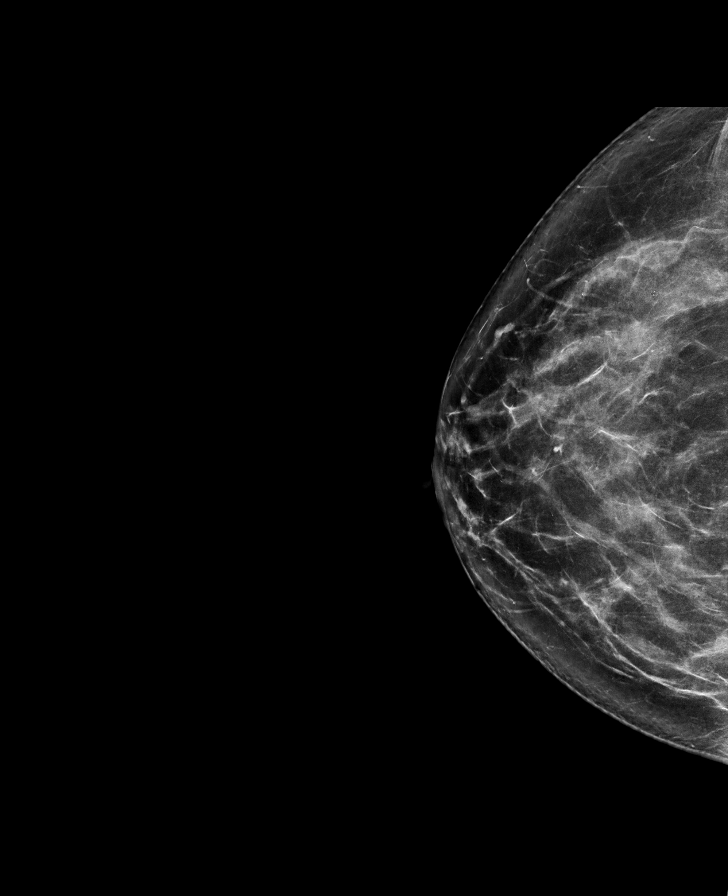

[R MLO synth-2D]
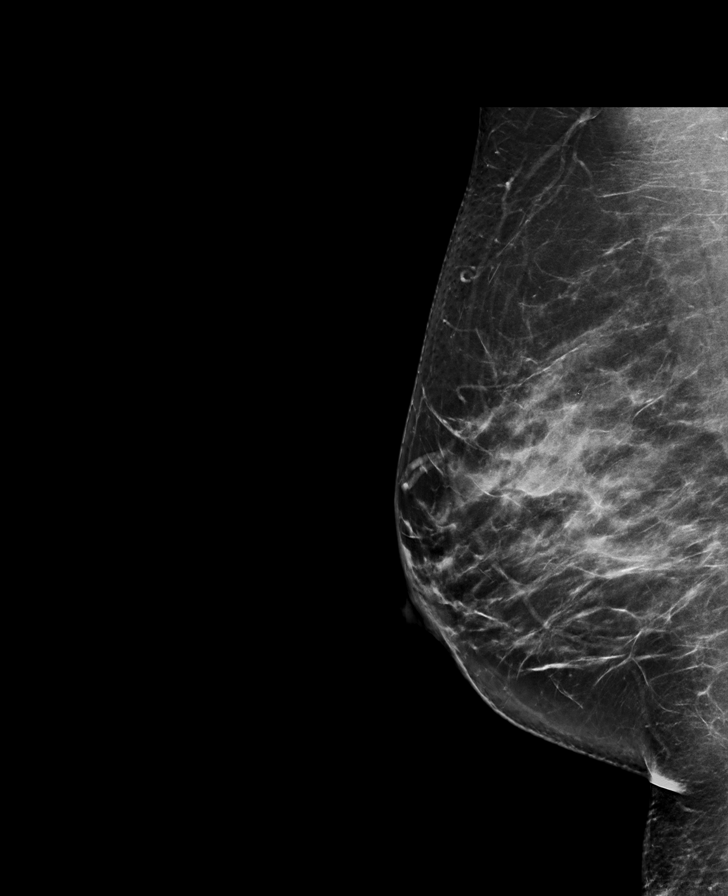

[L MLO synth-2D]
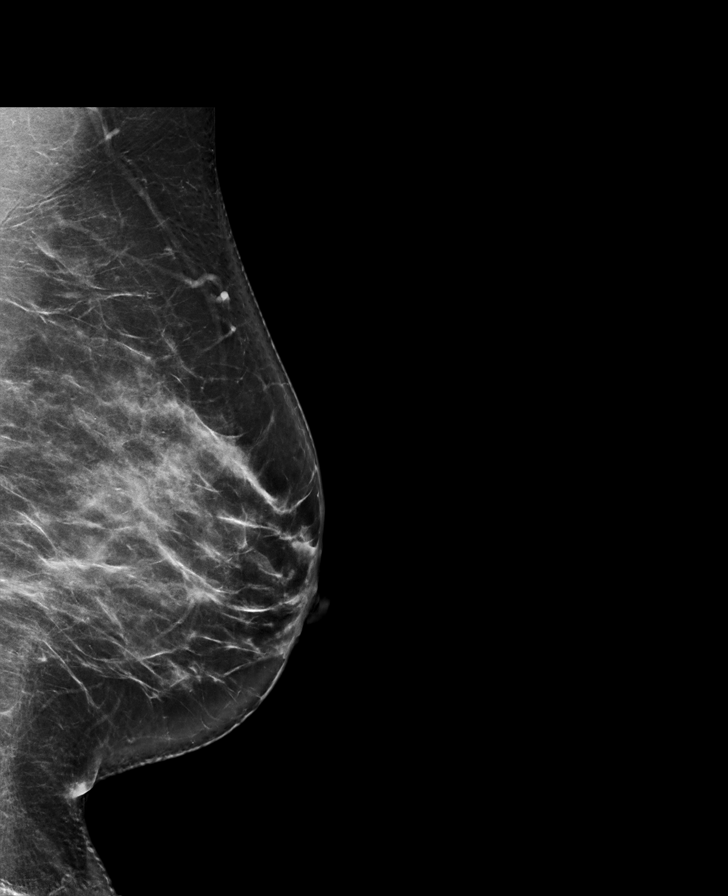

[R CC tomo · tomo slice 41/82.0]
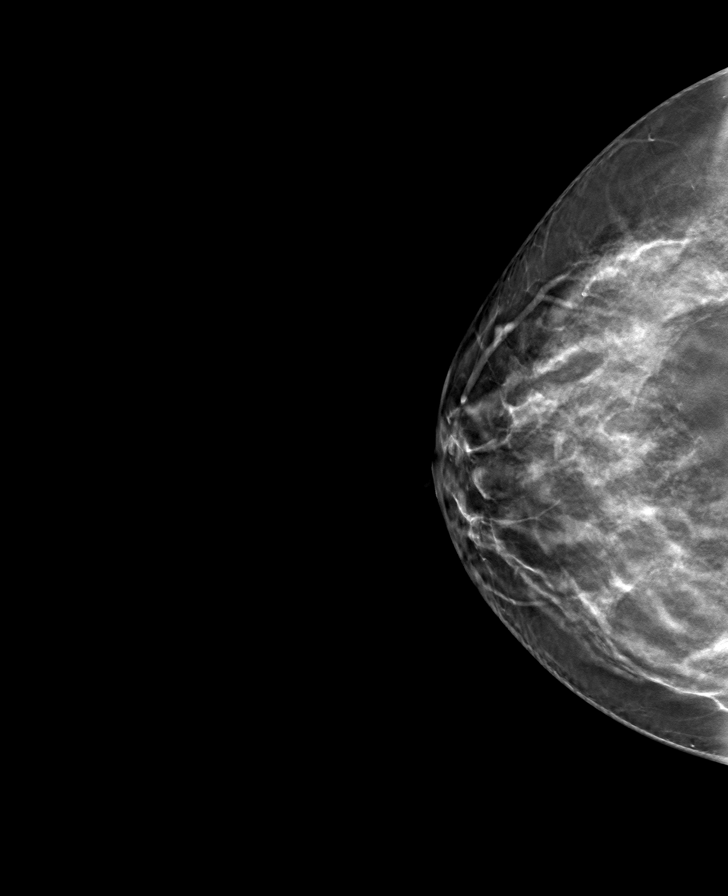

[L MLO tomo · tomo slice 45/89.0]
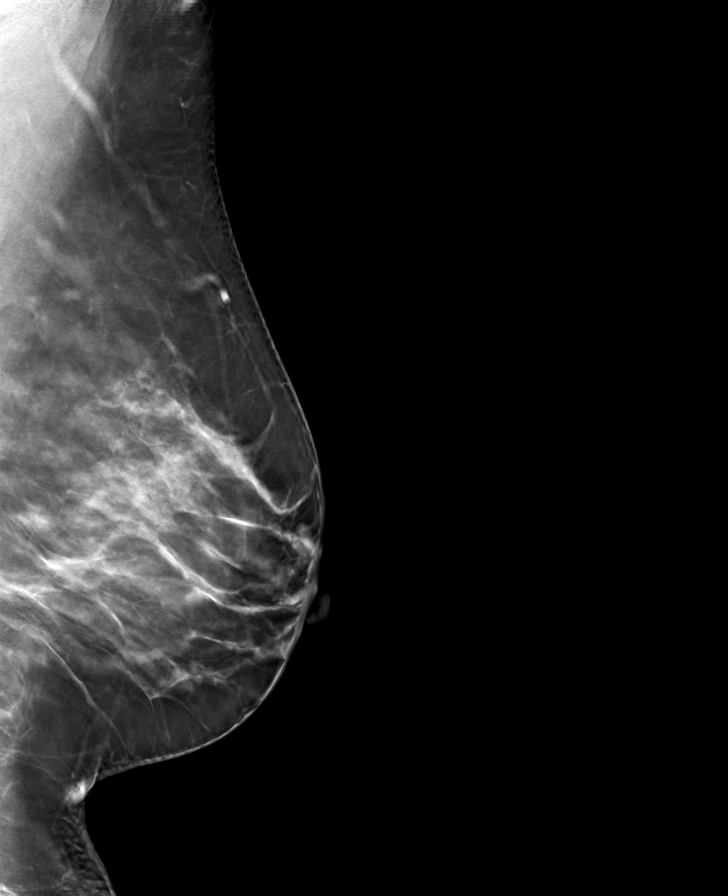

[L CC tomo · tomo slice 43/85.0]
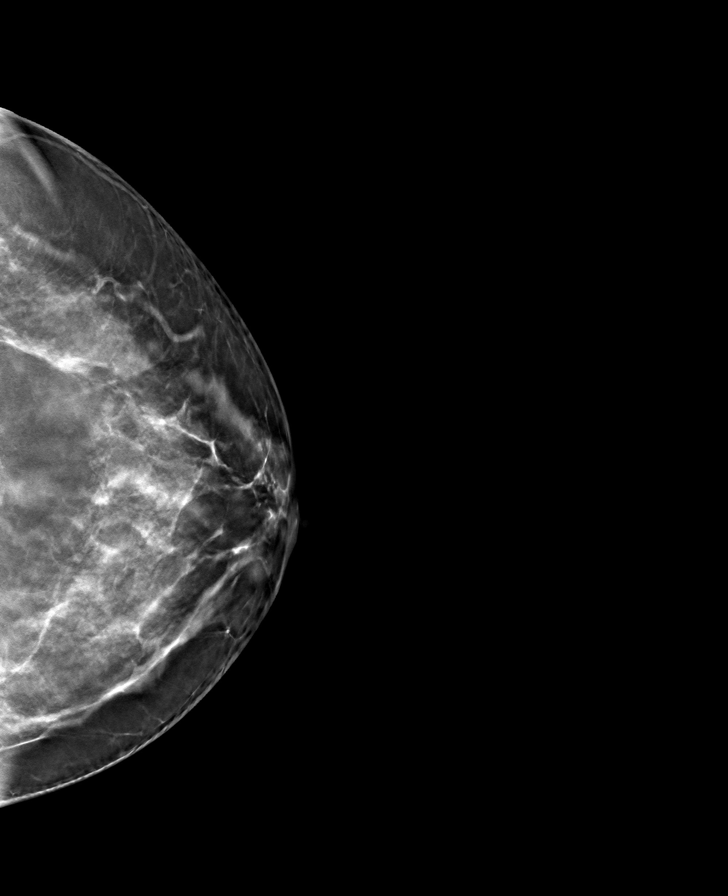

[R MLO tomo · tomo slice 45/89.0]
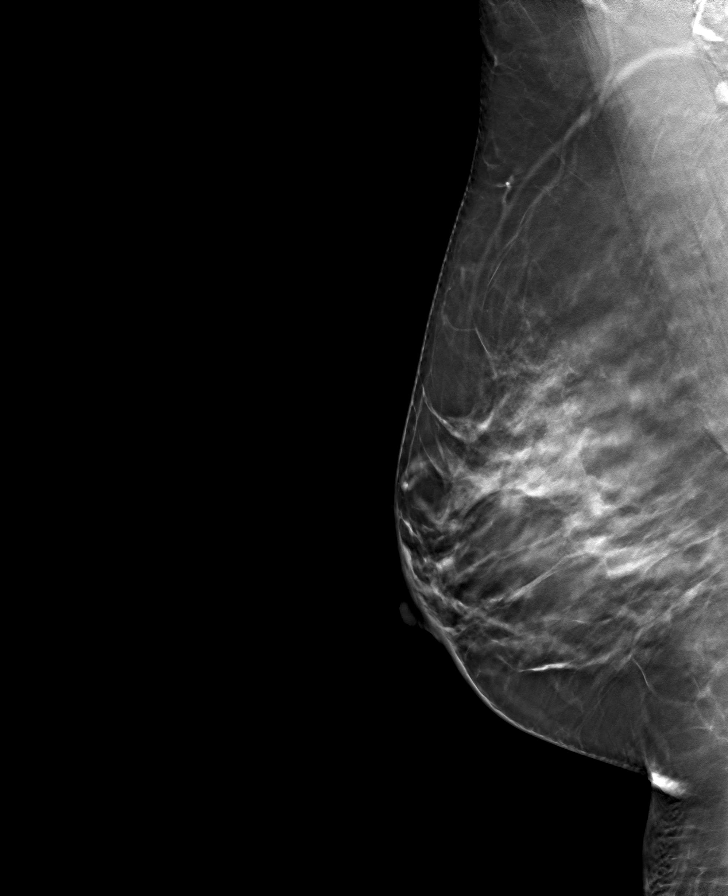

[8 of 24 positions shown; findings below may reference images not displayed]

ACR Breast Density Category c: The breast tissue is heterogeneously
dense, which may obscure small masses.
FINDINGS: There are no findings suspicious for malignancy.
IMPRESSION: No mammographic evidence of malignancy. A result letter of this
screening mammogram will be mailed directly to the patient.

RECOMMENDATION:
Screening mammogram in one year. (Code:Q3-W-BC3)

BI-RADS CATEGORY  1: Negative.

## 2024-01-19 ENCOUNTER — Emergency Department (HOSPITAL_COMMUNITY)
Admission: EM | Admit: 2024-01-19 | Discharge: 2024-01-19 | Disposition: A | Discharge status: Home / Self Care | Attending: Emergency Medicine | Admitting: Emergency Medicine

## 2024-01-19 ENCOUNTER — Encounter (HOSPITAL_COMMUNITY): Payer: Self-pay | Admitting: Emergency Medicine

## 2024-01-19 ENCOUNTER — Other Ambulatory Visit: Payer: Self-pay

## 2024-01-19 ENCOUNTER — Emergency Department (HOSPITAL_COMMUNITY)

## 2024-01-19 DIAGNOSIS — D72829 Elevated white blood cell count, unspecified: Secondary | ICD-10-CM | POA: Diagnosis not present

## 2024-01-19 DIAGNOSIS — R052 Subacute cough: Secondary | ICD-10-CM | POA: Insufficient documentation

## 2024-01-19 DIAGNOSIS — R0602 Shortness of breath: Secondary | ICD-10-CM | POA: Diagnosis not present

## 2024-01-19 DIAGNOSIS — R079 Chest pain, unspecified: Secondary | ICD-10-CM | POA: Diagnosis not present

## 2024-01-19 DIAGNOSIS — R059 Cough, unspecified: Secondary | ICD-10-CM | POA: Diagnosis present

## 2024-01-19 LAB — CBC
HCT: 41.3 % (ref 36.0–46.0)
Hemoglobin: 13.3 g/dL (ref 12.0–15.0)
MCH: 27.5 pg (ref 26.0–34.0)
MCHC: 32.2 g/dL (ref 30.0–36.0)
MCV: 85.3 fL (ref 80.0–100.0)
Platelets: 339 10*3/uL (ref 150–400)
RBC: 4.84 MIL/uL (ref 3.87–5.11)
RDW: 15.3 % (ref 11.5–15.5)
WBC: 11.1 10*3/uL — ABNORMAL HIGH (ref 4.0–10.5)
nRBC: 0 % (ref 0.0–0.2)

## 2024-01-19 LAB — TROPONIN I (HIGH SENSITIVITY): Troponin I (High Sensitivity): 4 ng/L (ref <18)

## 2024-01-19 LAB — RESP PANEL BY RT-PCR (RSV, FLU A&B, COVID)  RVPGX2
Influenza A by PCR: NEGATIVE
Influenza B by PCR: NEGATIVE
Resp Syncytial Virus by PCR: NEGATIVE
SARS Coronavirus 2 by RT PCR: NEGATIVE

## 2024-01-19 LAB — D-DIMER, QUANTITATIVE: D-Dimer, Quant: 0.41 ug{FEU}/mL (ref 0.00–0.50)

## 2024-01-19 LAB — BASIC METABOLIC PANEL WITH GFR
Anion gap: 9 (ref 5–15)
BUN: 10 mg/dL (ref 6–20)
CO2: 25 mmol/L (ref 22–32)
Calcium: 9 mg/dL (ref 8.9–10.3)
Chloride: 101 mmol/L (ref 98–111)
Creatinine, Ser: 0.63 mg/dL (ref 0.44–1.00)
GFR, Estimated: >60 mL/min (ref >60)
Glucose, Bld: 98 mg/dL (ref 70–99)
Potassium: 3.9 mmol/L (ref 3.5–5.1)
Sodium: 135 mmol/L (ref 135–145)

## 2024-01-19 LAB — BRAIN NATRIURETIC PEPTIDE: B Natriuretic Peptide: 17.7 pg/mL (ref 0.0–100.0)

## 2024-01-19 MED ORDER — PANTOPRAZOLE SODIUM 40 MG IV SOLR
40.0000 mg | Freq: Once | INTRAVENOUS | Status: AC
  Administered 2024-01-19: 40 mg via INTRAVENOUS
  Filled 2024-01-19: qty 10

## 2024-01-19 MED ORDER — ALUM & MAG HYDROXIDE-SIMETH 200-200-20 MG/5ML PO SUSP
30.0000 mL | Freq: Once | ORAL | Status: AC
  Administered 2024-01-19: 30 mL via ORAL
  Filled 2024-01-19: qty 30

## 2024-01-19 MED ORDER — LIDOCAINE VISCOUS HCL 2 % MT SOLN
15.0000 mL | Freq: Once | OROMUCOSAL | Status: AC
  Administered 2024-01-19: 15 mL via ORAL
  Filled 2024-01-19: qty 15

## 2024-01-19 MED ORDER — BENZONATATE 100 MG PO CAPS: 100.0000 mg | ORAL_CAPSULE | Freq: Three times a day (TID) | ORAL | 0 refills | Status: DC | PRN

## 2024-01-19 NOTE — ED Triage Notes (Signed)
 Pt endorses cough for a month. Also having central CP and SOB. Pt given inhaler by PCP with no relief.

## 2024-01-19 NOTE — Discharge Instructions (Addendum)
 Your workup today is reassuring. It is very unlikely that your pain is due to an issue in your heart.  Your cardiac enzyme (troponin) was normal today. Your EKG which is a measure of the heart's electrical activity and rhythm is normal today. These would both show abnormalities if you were having a heart attack.  Your chest x-ray is normal today.  It does not show any signs of pneumonia.  Your COVID, flu, and RSV test are negative.  Your blood counts and electrolytes today are normal.  Your kidney function is normal.  Your lab work does not show any signs of a blood clot.  You have been prescribed Tessalon Perles to take up to every 8 hours as needed for cough.  Please follow-up with your PCP if your symptoms are not starting to improve within the next week.   Return to the ER if you have any shortness of breath, difficulty breathing, worsening chest pain, dizziness, jaw pain, left arm or shoulder pain, abdominal pain, unexplained fever, any other new or concerning symptoms.

## 2024-01-19 NOTE — ED Provider Notes (Signed)
 [place] EMERGENCY DEPARTMENT AT [facility] Provider Note   CSN: [id number] Arrival date & time: 01/19/24  1506     History  Chief Complaint  Patient presents with   Chest Pain   Cough    Debbie [name] is a 53 y.o. female with history of hypertension presents with concern for ongoing dry cough for the past month as well as some intermittent shortness of breath and pain in the center of her chest. States the chest pain worsens with inspiration. Chest pain is nonexertional. States she was seen by her PCP for these concerns and was prescribed an albuterol inhaler which has not helped her symptoms.  She denies any fever, chills, sore throat.  Denies any leg pain or swelling, recent periods of ambulation, recent surgeries, or any history of a blood clot.   Chest Pain Associated symptoms: cough   Cough Associated symptoms: chest pain        Home Medications Prior to Admission medications   Medication Sig Start Date End Date Taking? Authorizing Provider  benzonatate (TESSALON) 100 MG capsule Take 1 capsule (100 mg total) by mouth 3 (three) times daily as needed for cough. 01/19/24  Yes Arabella Merles, PA-C  albuterol (VENTOLIN HFA) 108 (90 Base) MCG/ACT inhaler Inhale 2 puffs into the lungs every 4 (four) hours as needed for wheezing or shortness of breath. 11/05/23   Allwardt, Crist Infante, PA-C  ALPRAZolam (XANAX) 0.25 MG tablet Take 1 tablet (0.25 mg total) by mouth 2 (two) times daily as needed for anxiety. 08/24/23   Allwardt, Crist Infante, PA-C  meloxicam (MOBIC) 15 MG tablet Take 1 tab po daily as needed for arthritis pain flare. 11/05/23   Allwardt, Crist Infante, PA-C  metoprolol tartrate (LOPRESSOR) 50 MG tablet TAKE 1 TABLET BY MOUTH ONCE DAILY 30  MINUTES  PRIOR  TO  WORK  SHIFT 08/24/23   Allwardt, Crist Infante, PA-C  predniSONE (DELTASONE) 20 MG tablet Take 1 tablet (20 mg total) by mouth 2 (two) times daily with a meal. 01/02/24   Jarold Motto, PA      Allergies     Penicillins    Review of Systems   Review of Systems  Respiratory:  Positive for cough.   Cardiovascular:  Positive for chest pain.    Physical Exam Updated Vital Signs BP 130/64   Pulse (!) 109   Temp (!) 100.4 F (38 C) (Oral)   Resp 18   Ht 5\' 1"  (1.549 m)   Wt 80 kg   LMP  (LMP Unknown) Comment: pt on a pill to stop menses  SpO2 100%   BMI 33.32 kg/m  Physical Exam Vitals and nursing note reviewed.  Constitutional:      General: She is not in acute distress.    Appearance: She is well-developed.  HENT:     Head: Normocephalic and atraumatic.  Eyes:     Conjunctiva/sclera: Conjunctivae normal.  Cardiovascular:     Rate and Rhythm: Normal rate and regular rhythm.     Heart sounds: No murmur heard.    Comments: Radial pulse 2+ bilaterally Pulmonary:     Effort: Pulmonary effort is normal. No respiratory distress.     Breath sounds: Normal breath sounds.  Abdominal:     Palpations: Abdomen is soft.     Tenderness: There is no abdominal tenderness.  Musculoskeletal:        General: No swelling.     Cervical back: Neck supple.     Right lower leg:  No edema.     Left lower leg: No edema.     Comments: No tenderness palpation of the chest wall  No tenderness to palpation of the calves bilaterally  Skin:    General: Skin is warm and dry.     Capillary Refill: Capillary refill takes less than 2 seconds.  Neurological:     Mental Status: She is alert.  Psychiatric:        Mood and Affect: Mood normal.     ED Results / Procedures / Treatments   Labs (all labs ordered are listed, but only abnormal results are displayed) Labs Reviewed  CBC - Abnormal; Notable for the following components:      Result Value   WBC 11.1 (*)    All other components within normal limits  RESP PANEL BY RT-PCR (RSV, FLU A&B, COVID)  [id number]  BASIC METABOLIC PANEL WITH GFR  BRAIN NATRIURETIC PEPTIDE  D-DIMER, QUANTITATIVE  TROPONIN I (HIGH SENSITIVITY)    EKG EKG  Interpretation Date/Time:  Saturday January 19 2024 15:13:06 EDT Ventricular Rate:  109 PR Interval:  154 QRS Duration:  66 QT Interval:  314 QTC Calculation: 422 R Axis:   41  Text Interpretation: Sinus tachycardia Otherwise normal ECG downsloping st segment in lead III seen on prior No significant change since last tracing Confirmed by Melene Plan [phone]) on 01/19/2024 3:39:13 PM  Radiology DG Chest 2 View Result Date: 01/19/2024 CLINICAL DATA:  Cough, chest pain, and shortness of breath. EXAM: CHEST - 2 VIEW COMPARISON:  07/28/2023 FINDINGS: The heart size and mediastinal contours are within normal limits. Both lungs are clear. The visualized skeletal structures are unremarkable. IMPRESSION: No active cardiopulmonary disease. Electronically Signed   By: Danae Orleans M.D.   On: 01/19/2024 15:49    Procedures Procedures    Medications Ordered in ED Medications  alum & mag hydroxide-simeth (MAALOX/MYLANTA) 200-200-20 MG/5ML suspension 30 mL (30 mLs Oral Given 01/19/24 1824)    And  lidocaine (XYLOCAINE) 2 % viscous mouth solution 15 mL (15 mLs Oral Given 01/19/24 1824)  pantoprazole (PROTONIX) injection 40 mg (40 mg Intravenous Given 01/19/24 1826)    ED Course/ Medical Decision Making/ A&P                                 Medical Decision Making Amount and/or Complexity of Data Reviewed Labs: ordered. Radiology: ordered.     Differential diagnosis includes but is not limited to ACS, arrhythmia, aortic aneurysm, pericarditis, myocarditis, pericardial effusion, cardiac tamponade, musculoskeletal pain, GERD, Boerhaave's syndrome, DVT/PE, pneumonia, pleural effusion   ED Course:  Upon initial evaluation, patient well-appearing, stable vital signs beside a slightly elevated blood pressure 141/91.  States her chest pain, shortness of breath, and dry cough has been ongoing for the past month.  Lungs sound clear to auscultation on my exam.  She is breathing comfortably on room air and  talking in full sentences.   Labs Ordered: I Ordered, and personally interpreted labs.  The pertinent results include:   CBC with leukocytosis of 11.1, no anemia BMP within normal limits Troponin of 4 D-dimer within normal limits BNP within normal limits COVID, flu, RSV negative  Imaging Studies ordered: I ordered imaging studies including chest x-ray I independently visualized the imaging with scope of interpretation limited to determining acute life threatening conditions related to emergency care. Imaging showed no acute abnormality I agree with the radiologist interpretation   Cardiac  Monitoring: / EKG: The patient was maintained on a cardiac monitor.  I personally viewed and interpreted the cardiac monitored which showed an underlying rhythm of: Sinus tachycardia on EKG, Normal sinus rhythm on monitor. No ST changes   Medications Given: Maalox and Protonix given for pain   Upon re-evaluation, patient still well-appearing with stable vital signs yes.   Low concern for ACS at this time given pain has been ongoing for a month and troponin was 4, pain non-exertional, and EKG with normal sinus rhythm and no ST changes.  BNP within normal limits, low concern for CHF.  Chest x-ray without any acute abnormality. No concern for DVT or PE at this time given d-dimer within normal limits  Given cough ongoing for a month, suspect she may have had a viral URI with bronchitis.  She had already been placed on a course of prednisone recently by her PCP for symptoms.  She said this helped her symptoms, but then cough worsened after she stopped this medication.  At this time, I do not want to have her on a prolonged course of prednisone.  Will prescribe Tessalon Perles for cough and have her follow-up with PCP if symptoms not improving within the next week.    Impression: Cough  Disposition:  The patient was discharged home with instructions to take [facility] as needed for cough.   Follow-up with PCP within the next week if symptoms not improving. Return precautions given.    Record Review: External records from outside source obtained and reviewed including PCP's visit from 01/02/2024 where she was seen for her cough and at that time was prescribed prednisone for asthma     This chart was dictated using voice recognition software, Dragon. Despite the best efforts of this provider to proofread and correct errors, errors may still occur which can change documentation meaning.          Final Clinical Impression(s) / ED Diagnoses Final diagnoses:  Subacute cough    Rx / DC Orders ED Discharge Orders          Ordered    benzonatate (TESSALON) 100 MG capsule  3 times daily PRN        01/19/24 1829              Arabella Merles, PA-C 01/19/24 1837    Melene Plan, DO 01/19/24 1959

## 2024-01-29 ENCOUNTER — Ambulatory Visit: Admitting: Physician Assistant

## 2024-01-31 ENCOUNTER — Encounter: Payer: BC Managed Care – PPO | Admitting: Physician Assistant

## 2024-02-05 ENCOUNTER — Ambulatory Visit: Admitting: Physician Assistant

## 2024-02-27 ENCOUNTER — Encounter: Payer: BC Managed Care – PPO | Admitting: Physician Assistant

## 2024-02-28 ENCOUNTER — Ambulatory Visit: Admitting: Physician Assistant

## 2024-02-28 VITALS — BP 124/78 | HR 76 | Temp 98.6°F | Ht 61.0 in | Wt 177.2 lb

## 2024-02-28 DIAGNOSIS — I10 Essential (primary) hypertension: Secondary | ICD-10-CM | POA: Diagnosis not present

## 2024-02-28 DIAGNOSIS — R350 Frequency of micturition: Secondary | ICD-10-CM

## 2024-02-28 DIAGNOSIS — Z23 Encounter for immunization: Secondary | ICD-10-CM

## 2024-02-28 DIAGNOSIS — K5909 Other constipation: Secondary | ICD-10-CM

## 2024-02-28 NOTE — Patient Instructions (Signed)
  VISIT SUMMARY: During your visit, we discussed your recent issues with constipation and frequent urination. We also reviewed your well-controlled hypertension.  YOUR PLAN: CONSTIPATION: You have been experiencing chronic constipation, which has worsened over the past few weeks. -Use Miralax  for a cleanout by mixing 2-3 capfuls in Gatorade over the weekend if needed. -Take a daily stool softener for the next couple of weeks. -Adjust Miralax  to half to a full capful daily or every other day. -Consider adding Metamucil fiber supplement every other day. -Follow dietary recommendations to help with bowel regularity.  FREQUENT URINATION: You have been experiencing frequent urination, which may be related to your constipation. -Focus on managing constipation to reduce bladder pressure. -Monitor your urinary symptoms and report any changes.  HYPERTENSION: Your high blood pressure is well-controlled with your current medication. -Continue taking metoprolol  as prescribed. -Keep managing stress and maintain healthy lifestyle choices.                      Contains text generated by Abridge.                                 Contains text generated by Abridge.

## 2024-02-28 NOTE — Progress Notes (Signed)
 4   Patient ID: Debbie [name], female    DOB: 04-15-1971, 53 y.o.   MRN: [id number]   Assessment & Plan:   Chronic constipation  Frequent urination  Immunization due -     Varicella-zoster vaccine IM  Essential hypertension    Constipation Chronic constipation with recent exacerbation over the past 2-3 weeks. Bowel movements occur every 4 days, requiring Dulcolax. No blood in stool. Recent colonoscopy in June 2023 showed one small polyp, no diverticulosis. No nutritional or metabolic issues identified. Possible post-menopausal changes, stress, and dietary factors may contribute to symptoms. Discussed fiber intake and stress management. - Recommend Miralax  cleanout with 2-3 capfuls mixed in Gatorade over the weekend if not effective. - Advise daily stool softener for the next couple of weeks. - Suggest adjusting Miralax  to half to full capful daily or every other day. - Consider adding Metamucil fiber supplement every other day. - Provide dietary recommendations to aid bowel regularity.  Frequent urination Increased urinary frequency possibly related to constipation due to pressure on the bladder. No urinary tract infection or acute issues. Anti-bladder medication not advised due to potential constipation exacerbation. - Focus on managing constipation to alleviate bladder pressure. - Monitor urinary symptoms and report changes.  Hypertension Hypertension well-controlled with metoprolol . Blood pressure readings are within normal range. She reports feeling well and less stressed. Continued metoprolol  use advised for blood pressure control and stress management. - Continue metoprolol  as prescribed. - Encourage ongoing stress management and healthy lifestyle choices.        Return if symptoms worsen or fail to improve.    Subjective:    Chief Complaint  Patient presents with   Constipation    Pt seen in office today for constipation and urgency to urinate; pt  stated she feels bloated and uncomfortable.     Constipation   Discussed the use of AI scribe software for clinical note transcription with the patient, who gave verbal consent to proceed.  History of Present Illness Debbie [name] is a 53 year old female who presents with constipation and frequent urination.  She has been experiencing constipation for the past two to three weeks, requiring the use of Dulcolax to have a bowel movement. Without the medication, she is unable to have a bowel movement. Her stools are normal in appearance when she takes the medication, but without it, she experiences no bowel movement. She has tried increasing her water  intake and reducing her food intake to manage the symptoms.  She reports frequent urination, needing to urinate every two minutes at night, waking up four to five times. This has been ongoing for about a month. She drinks about two to three bottles of water  a day and has reduced her intake of juice and soda, opting for water  instead. She consumes one cup of caffeine in the morning.  Her past medical history includes a hysteroscopy and a CT scan in July of the previous year, which showed no concerning findings. She had a colon cancer screening in June 2023, which revealed one small polyp but no signs of diverticulosis. She is currently taking metoprolol  for high blood pressure, which she reports is well-controlled. She has also tried fiber supplements but is unsure of their effectiveness. Her daughter advised that fiber might cause more constipation, so she has been cautious with its use.  No blood in the stool or dark black stools. No significant changes in diet or travel that could have caused the symptoms.     Past Medical History:  Diagnosis Date   Anemia    Dysmenorrhea    Headache(784.0)    Hypertension    Hypoglycemia    possible per pt- becomes shaky and weak when hungry    Past Surgical History:  Procedure Laterality Date    ABDOMINAL HYSTERECTOMY     CESAREAN SECTION     X2   ROBOTIC ASSISTED TOTAL HYSTERECTOMY WITH BILATERAL SALPINGO OOPHERECTOMY Right 05/18/2015   Procedure: ROBOTIC ASSISTED LAPAROSCOPIC  TOTAL HYSTERECTOMY WITH RIGHT SALPINGECTOMY, LYSIS OF ADHESIONS, CYSTOSCOPY;  Surgeon: Lenord Radon, MD;  Location: [facility];  Service: Gynecology;  Laterality: Right;   TUBAL LIGATION      Family History  Problem Relation Age of Onset   Hypertension Mother    Diabetes Mother    Hypertension Father    Diabetes Father    Breast cancer Sister 40   Breast cancer Paternal Aunt 59   Prostate cancer Paternal Grandfather    Colon cancer Cousin        paternal   Anesthesia problems Neg Hx     Social History   Tobacco Use   Smoking status: Never   Smokeless tobacco: Never  Vaping Use   Vaping status: Never Used  Substance Use Topics   Alcohol use: Yes    Comment: occ   Drug use: No     Allergies  Allergen Reactions   Penicillins Other (See Comments)    Not sure if she has true allergy to Penicillin but her father told her that she or one of her sisters had a reaction.    Review of Systems  Gastrointestinal:  Positive for constipation.   NEGATIVE UNLESS OTHERWISE INDICATED IN HPI      Objective:     BP 124/78 (BP Location: Left Arm, Patient Position: Sitting, Cuff Size: Normal)   Pulse 76   Temp 98.6 F (37 C) (Temporal)   Ht 5\' 1"  (1.549 m)   Wt 177 lb 3.2 oz (80.4 kg)   LMP  (LMP Unknown) Comment: pt on a pill to stop menses  SpO2 99%   BMI 33.48 kg/m   Wt Readings from Last 3 Encounters:  02/28/24 177 lb 3.2 oz (80.4 kg)  01/19/24 176 lb 5.9 oz (80 kg)  01/02/24 176 lb 6.1 oz (80 kg)    BP Readings from Last 3 Encounters:  02/28/24 124/78  01/19/24 130/64  01/02/24 120/86     Physical Exam Vitals and nursing note reviewed.  Constitutional:      Appearance: Normal appearance.  Eyes:     Extraocular Movements: Extraocular movements intact.      Conjunctiva/sclera: Conjunctivae normal.     Pupils: Pupils are equal, round, and reactive to light.  Cardiovascular:     Rate and Rhythm: Normal rate and regular rhythm.     Pulses: Normal pulses.     Heart sounds: Normal heart sounds. No murmur heard. Pulmonary:     Effort: Pulmonary effort is normal.     Breath sounds: Normal breath sounds.  Abdominal:     General: Abdomen is flat. Bowel sounds are normal.     Palpations: Abdomen is soft. There is no mass.     Tenderness: There is no abdominal tenderness. There is no right CVA tenderness, left CVA tenderness or guarding.  Neurological:     Mental Status: She is alert.  Psychiatric:        Mood and Affect: Mood normal.             Deleta Felix  Aashvi Rezabek, PA-C

## 2024-04-08 ENCOUNTER — Encounter: Payer: Self-pay | Admitting: Physician Assistant

## 2024-04-09 ENCOUNTER — Ambulatory Visit: Payer: Self-pay

## 2024-04-09 ENCOUNTER — Other Ambulatory Visit: Payer: Self-pay

## 2024-04-09 ENCOUNTER — Ambulatory Visit: Admitting: Family

## 2024-04-09 VITALS — BP 116/84 | HR 100 | Temp 98.1°F | Ht 62.0 in | Wt 173.2 lb

## 2024-04-09 DIAGNOSIS — R1084 Generalized abdominal pain: Secondary | ICD-10-CM

## 2024-04-09 DIAGNOSIS — K5909 Other constipation: Secondary | ICD-10-CM | POA: Diagnosis not present

## 2024-04-09 LAB — CBC WITH DIFFERENTIAL/PLATELET
Basophils Absolute: 0.1 10*3/uL (ref 0.0–0.1)
Basophils Relative: 0.4 % (ref 0.0–3.0)
Eosinophils Absolute: 0 10*3/uL (ref 0.0–0.7)
Eosinophils Relative: 0.1 % (ref 0.0–5.0)
HCT: 42.4 % (ref 36.0–46.0)
Hemoglobin: 13.7 g/dL (ref 12.0–15.0)
Lymphocytes Relative: 10.8 % — ABNORMAL LOW (ref 12.0–46.0)
Lymphs Abs: 1.6 10*3/uL (ref 0.7–4.0)
MCHC: 32.4 g/dL (ref 30.0–36.0)
MCV: 82.7 fl (ref 78.0–100.0)
Monocytes Absolute: 0.6 10*3/uL (ref 0.1–1.0)
Monocytes Relative: 3.9 % (ref 3.0–12.0)
Neutro Abs: 12.5 10*3/uL — ABNORMAL HIGH (ref 1.4–7.7)
Neutrophils Relative %: 84.8 % — ABNORMAL HIGH (ref 43.0–77.0)
Platelets: 377 10*3/uL (ref 150.0–400.0)
RBC: 5.12 Mil/uL — ABNORMAL HIGH (ref 3.87–5.11)
RDW: 14.5 % (ref 11.5–15.5)
WBC: 14.7 10*3/uL — ABNORMAL HIGH (ref 4.0–10.5)

## 2024-04-09 MED ORDER — MAGNESIUM CITRATE PO SOLN: 1.0000 | Freq: Once | ORAL | 0 refills | Status: AC

## 2024-04-09 NOTE — Patient Instructions (Addendum)
  Fue muy agradable verte hoy!   TU PLAN:   -ESTREIMIENTO: Tienes estreimiento crnico con dolor abdominal severo y sospecha de impactacin fecal, lo que significa que hay una masa dura de heces bloqueando tus intestinos.  Te han recetado citrato de magnesio, bebe medio bote para Corporate investment banker, lo que debera funcionar dentro de 24 horas, pero puede causar diarrea.  Si no hay movimiento intestinal, termina el bote al da siguiente.  Debes comenzar a tomar suplementos de fibra diarios como Benefiber, Citrucel o MiraLAX  de venta libre para asegurar un movimiento intestinal diario.  Bebe al menos cinco botellas de agua de 16.9 onzas al da y Biomedical engineer ejercicio cardiovascular regularmente.  Se ha solicitado un anlisis de sangre (CBC) para verificar si hay infeccin.  Por favor, llama antes del viernes si no hay mejora en tus intestinos   NOTA: Si tuviste algn anlisis de laboratorio, hznoslo saber si no has recibido [employer]. Puedes ver tus [employer] antes de que tengamos la oportunidad de revisarlos, pero te llamaremos una vez que los [place].

## 2024-04-09 NOTE — Telephone Encounter (Signed)
 FYI Only or Action Required?: FYI only for provider.  Patient was last seen in primary care on 01/02/2024 by Job Lukes, PA. Called Nurse Triage reporting Abdominal Pain. Symptoms began several weeks ago. Interventions attempted: OTC medications: Milk of Mag, Miralax , enema. Symptoms are: gradually worsening.  Triage Disposition: See PCP When Office is Open (Within 3 Days)  Patient/caregiver understands and will follow disposition?: Yes              Copied from CRM [id number]. Topic: Clinical - Red Word Triage >> Apr 09, 2024  8:25 AM Aleatha C wrote: Red Word that prompted transfer to Nurse Triage: Patient states she is severe pain , from being constipated for ongoing two weeks Reason for Disposition  Unable to have a bowel movement (BM) without laxative or enema  Answer Assessment - Initial Assessment Questions 1. STOOL PATTERN OR FREQUENCY: How often do you have a bowel movement (BM)?  (Normal range: 3 times a day to every 3 days)  When was your last BM?       More than 2 weeks, has BM 1 or 2 a week with medicine   2. STRAINING: Do you have to strain to have a BM?      Yes  3. RECTAL PAIN: Does your rectum hurt when the stool comes out? If Yes, ask: Do you have hemorrhoids? How bad is the pain?  (Scale 1-10; or mild, moderate, severe)     Yes  4. STOOL COMPOSITION: Are the stools hard?      Yes, very hard  5. BLOOD ON STOOLS: Has there been any blood on the toilet tissue or on the surface of the BM? If Yes, ask: When was the last time?     No  6. CHRONIC CONSTIPATION: Is this a new problem for you?  If No, ask: How long have you had this problem? (days, weeks, months)      No  7. CHANGES IN DIET OR HYDRATION: Have there been any recent changes in your diet? How much fluids are you drinking on a daily basis?  How much have you had to drink today?     Feels dehydrated  8. MEDICINES: Have you been taking any new medicines? Are you taking  any narcotic pain medicines? (e.g., Dilaudid , morphine , Percocet, Vicodin)     No;   9. LAXATIVES: Have you been using any stool softeners, laxatives, or enemas?  If Yes, ask What, how often, and when was the last time?     Enema and Milk of magnesium   10. ACTIVITY:  How much walking do you do every day?  Has your activity level decreased in the past week?        Walks at work, but otherwise sedentary  11. CAUSE: What do you think is causing the constipation?        Unsure  12. OTHER SYMPTOMS: Do you have any other symptoms? (e.g., abdomen pain, bloating, fever, vomiting)       Nausea and feverish yesterday  13. MEDICAL HISTORY: Do you have a history of hemorrhoids, rectal fissures, or rectal surgery or rectal abscess?         No  14. PREGNANCY: Is there any chance you are pregnant? When was your last menstrual period?       No  Protocols used: Constipation-A-AH

## 2024-04-09 NOTE — Telephone Encounter (Signed)
 Appt today

## 2024-04-09 NOTE — Progress Notes (Signed)
 Patient ID: Debbie [name], female    DOB: [birth year], 53 y.o.   MRN: [id number]  Chief Complaint  Patient presents with   Constipation    Started about a month ago, and yesterday it got worst. Trying OTC Magnessium, Stool softner and Tylenol  for pain. None is helping.   Discussed the use of AI scribe software for clinical note transcription with the patient, who gave verbal consent to proceed. *Due to language barrier, an interpreter was present during the history-taking and subsequent discussion (and for all of the physical exam) with this patient.  History of Present Illness Ellana [name] is a 53 year old female who presents with constipation and abdominal pain.  She has experienced constipation and abdominal pain for the past month. Initially, over-the-counter treatments like Milk of Magnesia and stool softeners were effective, but they no longer provide relief. She has not had a bowel movement for a week. The abdominal pain is diffuse across the lower abdomen. She experiences nausea and has not eaten since yesterday due to a sensation of impending vomiting. An enema today provided some relief after about an hour, but she still feels pain and describes the stool as hard and in three large pieces.  She feels tired and shaky, with a sensation of impending syncope earlier today. She experienced fever, feeling hot with chills, and taking Tylenol  and water  for relief, but did not measure her temperature.  She is hydrating and drinking water . She has no history of diverticulitis and has not been on a daily laxative regimen.  Assessment & Plan Constipation Chronic constipation with exacerbation, severe abdominal pain, and suspected fecal impaction. Limited response to OTC treatments. Symptoms include nausea, fatigue, shaking, and fever. - Prescribed magnesium  citrate, half a bottle, expect results within 24 hours, finish dose if no BM.  Informed about potential diarrhea. - Advised  daily fiber supplements: Benefiber, Citrucel, or MiraLAX . - Encouraged hydration: at least five 16.9-ounce water  bottles daily. - Recommended regular exercise. - Ordered CBC to check for infection. - Instructed to call by Friday if no improvement.    Subjective:    Outpatient Medications Prior to Visit  Medication Sig Dispense Refill   albuterol  (VENTOLIN  HFA) 108 (90 Base) MCG/ACT inhaler Inhale 2 puffs into the lungs every 4 (four) hours as needed for wheezing or shortness of breath. 9 g 0   metoprolol  tartrate (LOPRESSOR ) 50 MG tablet TAKE 1 TABLET BY MOUTH ONCE DAILY 30  MINUTES  PRIOR  TO  WORK  SHIFT 90 tablet 3   No facility-administered medications prior to visit.   Past Medical History:  Diagnosis Date   Anemia    Dysmenorrhea    Headache(784.0)    Hypertension    Hypoglycemia    possible per pt- becomes shaky and weak when hungry   Past Surgical History:  Procedure Laterality Date   ABDOMINAL HYSTERECTOMY     CESAREAN SECTION     X2   ROBOTIC ASSISTED TOTAL HYSTERECTOMY WITH BILATERAL SALPINGO OOPHERECTOMY Right 05/18/2015   Procedure: ROBOTIC ASSISTED LAPAROSCOPIC  TOTAL HYSTERECTOMY WITH RIGHT SALPINGECTOMY, LYSIS OF ADHESIONS, CYSTOSCOPY;  Surgeon: Elveria Mungo, MD;  Location: [facility];  Service: Gynecology;  Laterality: Right;   TUBAL LIGATION     Allergies  Allergen Reactions   Penicillins Other (See Comments)    Not sure if she has true allergy to Penicillin but her father told her that she or one of her sisters had a reaction.      Objective:  Physical Exam Vitals and nursing note reviewed.  Constitutional:      Appearance: Normal appearance. She is obese.   Cardiovascular:     Rate and Rhythm: Normal rate and regular rhythm.  Pulmonary:     Effort: Pulmonary effort is normal.     Breath sounds: Normal breath sounds.  Abdominal:     General: There is no distension.     Palpations: Abdomen is soft.     Tenderness: There is generalized  abdominal tenderness.   Musculoskeletal:        General: Normal range of motion.   Skin:    General: Skin is warm and dry.   Neurological:     Mental Status: She is alert.   Psychiatric:        Mood and Affect: Mood normal.        Behavior: Behavior normal.    BP 116/84   Pulse 100   Temp 98.1 F (36.7 C) (Temporal)   Ht 5' 2 (1.575 m)   Wt 173 lb 3.2 oz (78.6 kg)   LMP  (LMP Unknown) Comment: pt on a pill to stop menses  SpO2 98%   BMI 31.68 kg/m  Wt Readings from Last 3 Encounters:  04/09/24 173 lb 3.2 oz (78.6 kg)  02/28/24 177 lb 3.2 oz (80.4 kg)  01/19/24 176 lb 5.9 oz (80 kg)      Debbie Krabbe, NP

## 2024-04-09 NOTE — Telephone Encounter (Signed)
 Please see pt msg and advise next steps; not seeing any referral recommendations from OV notes 03/30/24

## 2024-04-11 ENCOUNTER — Ambulatory Visit: Payer: Self-pay | Admitting: Family

## 2024-04-11 ENCOUNTER — Telehealth: Payer: Self-pay | Admitting: Physician Assistant

## 2024-04-11 DIAGNOSIS — K659 Peritonitis, unspecified: Secondary | ICD-10-CM

## 2024-04-11 MED ORDER — AMOXICILLIN-POT CLAVULANATE 875-125 MG PO TABS: 1.0000 | ORAL_TABLET | Freq: Two times a day (BID) | ORAL | 0 refills | Status: DC

## 2024-04-11 MED ORDER — CIPROFLOXACIN HCL 500 MG PO TABS: 500.0000 mg | ORAL_TABLET | Freq: Two times a day (BID) | ORAL | 0 refills | Status: AC

## 2024-04-11 MED ORDER — METRONIDAZOLE 500 MG PO TABS: 500.0000 mg | ORAL_TABLET | Freq: Two times a day (BID) | ORAL | 0 refills | Status: AC

## 2024-04-11 NOTE — Telephone Encounter (Signed)
 Called and spoke to [place] at the pharmacy; amoxicillin was cancelled due to allergy; metronidazole  and Cipro sent to pharmacy and pharmacist confirmed receiving prescriptions. Nothing further needed at this time.

## 2024-04-11 NOTE — Telephone Encounter (Unsigned)
 Copied from CRM [id number]. Topic: Clinical - Medication Question >> Apr 11, 2024  9:28 AM Annabella RAMAN wrote: Reason for CRM: Amoxicillin   [facility] need clarification on medication  [phone] [place]

## 2024-04-23 ENCOUNTER — Ambulatory Visit
Admission: RE | Admit: 2024-04-23 | Discharge: 2024-04-23 | Disposition: A | Discharge status: Home / Self Care | Source: Ambulatory Visit | Attending: Physician Assistant | Admitting: Physician Assistant

## 2024-04-23 ENCOUNTER — Ambulatory Visit: Payer: Self-pay | Admitting: Physician Assistant

## 2024-04-23 ENCOUNTER — Ambulatory Visit: Admitting: Physician Assistant

## 2024-04-23 VITALS — BP 118/76 | HR 72 | Temp 98.4°F | Ht 62.0 in | Wt 168.8 lb

## 2024-04-23 DIAGNOSIS — R195 Other fecal abnormalities: Secondary | ICD-10-CM | POA: Diagnosis not present

## 2024-04-23 DIAGNOSIS — R1013 Epigastric pain: Secondary | ICD-10-CM

## 2024-04-23 DIAGNOSIS — R350 Frequency of micturition: Secondary | ICD-10-CM | POA: Diagnosis not present

## 2024-04-23 DIAGNOSIS — D72829 Elevated white blood cell count, unspecified: Secondary | ICD-10-CM

## 2024-04-23 LAB — CBC WITH DIFFERENTIAL/PLATELET
Basophils Absolute: 0.1 K/uL (ref 0.0–0.1)
Basophils Relative: 0.8 % (ref 0.0–3.0)
Eosinophils Absolute: 0.1 K/uL (ref 0.0–0.7)
Eosinophils Relative: 0.9 % (ref 0.0–5.0)
HCT: 42 % (ref 36.0–46.0)
Hemoglobin: 13.6 g/dL (ref 12.0–15.0)
Lymphocytes Relative: 32 % (ref 12.0–46.0)
Lymphs Abs: 2.4 K/uL (ref 0.7–4.0)
MCHC: 32.5 g/dL (ref 30.0–36.0)
MCV: 83 fl (ref 78.0–100.0)
Monocytes Absolute: 0.5 K/uL (ref 0.1–1.0)
Monocytes Relative: 6.2 % (ref 3.0–12.0)
Neutro Abs: 4.5 K/uL (ref 1.4–7.7)
Neutrophils Relative %: 60.1 % (ref 43.0–77.0)
Platelets: 390 K/uL (ref 150.0–400.0)
RBC: 5.06 Mil/uL (ref 3.87–5.11)
RDW: 14 % (ref 11.5–15.5)
WBC: 7.5 K/uL (ref 4.0–10.5)

## 2024-04-23 LAB — POC URINALSYSI DIPSTICK (AUTOMATED)
Bilirubin, UA: NEGATIVE
Blood, UA: NEGATIVE
Glucose, UA: NEGATIVE
Ketones, UA: NEGATIVE
Leukocytes, UA: NEGATIVE
Nitrite, UA: NEGATIVE
Protein, UA: NEGATIVE
Spec Grav, UA: 1.015 (ref 1.010–1.025)
Urobilinogen, UA: 0.2 U/dL
pH, UA: 6 (ref 5.0–8.0)

## 2024-04-23 LAB — COMPREHENSIVE METABOLIC PANEL WITH GFR
ALT: 18 U/L (ref 0–35)
AST: 21 U/L (ref 0–37)
Albumin: 4.8 g/dL (ref 3.5–5.2)
Alkaline Phosphatase: 53 U/L (ref 39–117)
BUN: 10 mg/dL (ref 6–23)
CO2: 32 meq/L (ref 19–32)
Calcium: 9.4 mg/dL (ref 8.4–10.5)
Chloride: 101 meq/L (ref 96–112)
Creatinine, Ser: 0.54 mg/dL (ref 0.40–1.20)
GFR: 105.14 mL/min (ref >60.00)
Glucose, Bld: 100 mg/dL — ABNORMAL HIGH (ref 70–99)
Potassium: 3.7 meq/L (ref 3.5–5.1)
Sodium: 140 meq/L (ref 135–145)
Total Bilirubin: 0.5 mg/dL (ref 0.2–1.2)
Total Protein: 7.7 g/dL (ref 6.0–8.3)

## 2024-04-23 LAB — LIPASE: Lipase: 20 U/L (ref 11.0–59.0)

## 2024-04-23 MED ORDER — IOPAMIDOL (ISOVUE-300) INJECTION 61%
100.0000 mL | Freq: Once | INTRAVENOUS | Status: AC | PRN
  Administered 2024-04-23: 100 mL via INTRAVENOUS

## 2024-04-23 NOTE — Progress Notes (Signed)
 Patient ID: Debbie [name], female    DOB: June 14, 1971, 53 y.o.   MRN: [id number]   Assessment & Plan:  Epigastric abdominal pain -     CT ABDOMEN PELVIS W CONTRAST; Future -     CBC with Differential/Platelet -     Comprehensive metabolic panel with GFR -     Lipase -     POCT Urinalysis Dipstick (Automated)  Leukocytosis, unspecified type -     CT ABDOMEN PELVIS W CONTRAST; Future -     CBC with Differential/Platelet -     Comprehensive metabolic panel with GFR -     Lipase -     POCT Urinalysis Dipstick (Automated)  Change in stool -     CT ABDOMEN PELVIS W CONTRAST; Future -     CBC with Differential/Platelet -     Comprehensive metabolic panel with GFR -     Lipase -     POCT Urinalysis Dipstick (Automated)  Frequent urination -     CBC with Differential/Platelet -     Comprehensive metabolic panel with GFR -     Lipase -     POCT Urinalysis Dipstick (Automated)    Assessment & Plan Abdominal Pain Severe epigastric abdominal pain persists despite metronidazole  and ciprofloxacin  for suspected abdominal infection. Symptoms include changes in stool consistency, nausea, fatigue, and weight loss. Elevated WBC count (14.7) from June 25th suggests possible infection or inflammation. CT abdomen and pelvis is indicated to evaluate the source of pain. - Order stat CT abdomen and pelvis with contrast - Order stat CBC, CMP, lipase, and point of care urine - Advise ER visit if symptoms worsen - Instruct to keep cell phone close for communication regarding test results - Provide contact information for [facility] for follow-up scheduling  Anxiety High anxiety levels, potentially exacerbated by recent personal stressors, including the death of her children's father. Anxiety may contribute to malaise and affect gastrointestinal symptoms.  Urinary Symptoms Reports sensation of heat during urination without significant burning, indicating possible mild urinary  tract irritation or infection. Point of care urine test was normal.      No follow-ups on file.    Subjective:    Chief Complaint  Patient presents with   Constipation    Pt in office due to issues with chronic constipation; referral placed for patient to [facility]; PT also states she is in stomach pain to where she can't eat for about a week.    HPI Discussed the use of AI scribe software for clinical note transcription with the patient, who gave verbal consent to proceed.  History of Present Illness Debbie [name] is a 53 year old female with chronic constipation who presents with severe abdominal pain and inability to eat.  She experiences severe abdominal pain and an inability to eat, stating that she can't even eat. She attempted to eat two meats yesterday, the first in a week, but continues to struggle with food intake. Coffee and other foods taste different to her.  Her bowel movements are different, though not constipated or diarrheal. She mentions that her stool was black once or twice after taking a pill, but is now a normal color. She has been using the bathroom regularly, including this morning.  She experiences significant fatigue, describing herself as 'super tired.' She also reports nausea and shakiness, but no fever. She has lost five pounds since her last visit.  She has a history of chronic constipation with a previous exacerbation of  severe abdominal pain, which did not respond to over-the-counter treatments. She was previously prescribed Benefiber, Citrucel, Miralax , metronidazole , and ciprofloxacin . She tried Miralax  but noted that it caused her to go to the bathroom every time she tried to eat.  She reports increased anxiety, which she attributes to her current symptoms and the recent death of her children's father last month. She feels overwhelmed by the combination of physical pain and emotional stress.  She reports some urinary symptoms, noting a  slight burning sensation when urinating today, but not previously. She also mentions feeling chills, though she is uncertain if this is due to anxiety.     Past Medical History:  Diagnosis Date   Allergy Penicilina   Anemia    Anxiety 2021   Asthma    Depression    Dysmenorrhea    Headache(784.0)    Hypertension    Hypoglycemia    possible per pt- becomes shaky and weak when hungry    Past Surgical History:  Procedure Laterality Date   ABDOMINAL HYSTERECTOMY     CESAREAN SECTION     X2   EYE SURGERY     ROBOTIC ASSISTED TOTAL HYSTERECTOMY WITH BILATERAL SALPINGO OOPHERECTOMY Right 05/18/2015   Procedure: ROBOTIC ASSISTED LAPAROSCOPIC  TOTAL HYSTERECTOMY WITH RIGHT SALPINGECTOMY, LYSIS OF ADHESIONS, CYSTOSCOPY;  Surgeon: Elveria Mungo, MD;  Location: [facility];  Service: Gynecology;  Laterality: Right;   TUBAL LIGATION      Family History  Problem Relation Age of Onset   Hypertension Mother    Diabetes Mother    Asthma Mother    Hypertension Father    Diabetes Father    Cancer Father    Stroke Father    Breast cancer Sister 43   Breast cancer Paternal Aunt 70   Prostate cancer Paternal Grandfather    Cancer Paternal Grandfather    Colon cancer Cousin        paternal   Diabetes Sister    Anesthesia problems Neg Hx     Social History   Tobacco Use   Smoking status: Never   Smokeless tobacco: Never  Vaping Use   Vaping status: Never Used  Substance Use Topics   Alcohol use: Yes    Comment: occ   Drug use: No     Allergies  Allergen Reactions   Penicillins Other (See Comments)    Not sure if she has true allergy to Penicillin but her father told her that she or one of her sisters had a reaction.    Review of Systems NEGATIVE UNLESS OTHERWISE INDICATED IN HPI      Objective:     BP 118/76 (BP Location: Left Arm, Patient Position: Sitting, Cuff Size: Normal)   Pulse 72   Temp 98.4 F (36.9 C) (Temporal)   Ht 5' 2 (1.575 m)   Wt 168 lb  12.8 oz (76.6 kg)   LMP  (LMP Unknown) Comment: pt on a pill to stop menses  SpO2 95%   BMI 30.87 kg/m   Wt Readings from Last 3 Encounters:  04/23/24 168 lb 12.8 oz (76.6 kg)  04/09/24 173 lb 3.2 oz (78.6 kg)  02/28/24 177 lb 3.2 oz (80.4 kg)    BP Readings from Last 3 Encounters:  04/23/24 118/76  04/09/24 116/84  02/28/24 124/78     Physical Exam Vitals and nursing note reviewed.  Constitutional:      General: She is not in acute distress.    Appearance: Normal appearance. She is not ill-appearing.  HENT:     Head: Normocephalic and atraumatic.  Eyes:     Extraocular Movements: Extraocular movements intact.     Conjunctiva/sclera: Conjunctivae normal.     Pupils: Pupils are equal, round, and reactive to light.  Cardiovascular:     Rate and Rhythm: Normal rate and regular rhythm.     Pulses: Normal pulses.     Heart sounds: Normal heart sounds.  Pulmonary:     Effort: Pulmonary effort is normal.     Breath sounds: Normal breath sounds.  Abdominal:     General: Abdomen is flat. Bowel sounds are normal. There is distension.     Palpations: Abdomen is soft. There is no mass.     Tenderness: There is abdominal tenderness (epigastric mostly, some diffuse pain). There is no right CVA tenderness, left CVA tenderness, guarding or rebound.     Hernia: No hernia is present.  Skin:    General: Skin is warm and dry.     Findings: No rash.  Neurological:     General: No focal deficit present.     Mental Status: She is alert.     Motor: No weakness.  Psychiatric:        Mood and Affect: Mood normal.             Debbie Stites M Miabella Shannahan, PA-C

## 2024-04-28 ENCOUNTER — Ambulatory Visit: Admitting: Physician Assistant

## 2024-05-19 ENCOUNTER — Ambulatory Visit: Payer: Self-pay | Admitting: Gastroenterology

## 2024-05-19 ENCOUNTER — Encounter: Payer: Self-pay | Admitting: Gastroenterology

## 2024-05-19 ENCOUNTER — Other Ambulatory Visit (INDEPENDENT_AMBULATORY_CARE_PROVIDER_SITE_OTHER)

## 2024-05-19 VITALS — BP 130/80 | HR 68 | Ht 61.25 in | Wt 170.2 lb

## 2024-05-19 DIAGNOSIS — R1013 Epigastric pain: Secondary | ICD-10-CM | POA: Diagnosis not present

## 2024-05-19 DIAGNOSIS — R110 Nausea: Secondary | ICD-10-CM | POA: Diagnosis not present

## 2024-05-19 DIAGNOSIS — K5909 Other constipation: Secondary | ICD-10-CM

## 2024-05-19 LAB — TSH: TSH: 1.49 u[IU]/mL (ref 0.35–5.50)

## 2024-05-19 MED ORDER — OMEPRAZOLE 20 MG PO CPDR: 20.0000 mg | DELAYED_RELEASE_CAPSULE | Freq: Every day | ORAL | 0 refills | Status: DC

## 2024-05-19 NOTE — Progress Notes (Signed)
 Chief Complaint: Chronic constipation Primary GI Doctor: (Previously Dr. Eda) Dr. Federico  HPI:  Patient is a 53year old female patient with past medical history of hypertension, asthma, chronic constipation, who was referred to me by Allwardt, Mardy HERO, PA-C on 04/09/2024 for a evaluation of chronic constipation.   Patient last seen in [facility] with Dr. Eda on 03/22/2022 for colon cancer screening.  Evaluated at [facility] in 2021 after a 1.9 x 3.2 x 3.8cm right lower quadrant mass was found on CT scan.  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  Pathology indicated an endometrioma.  Her postoperative course was uncomplicated.    Interval History    Patient presents for evaluation for chronic constipation, abdominal pain, and nausea.  Patient recently presented to her PCP with complaints of epigastric pain, obstipation and nausea.  Initially patient tried stool softeners which she states made the pain worse.  She then switched to daily MiraLAX  which worked well. Lab work revealed elevated White blood count therefore patient was treated for suspected abdominal infection with metronidazole  and ciprofloxacin .  Patient states she completed antibiotics which helped with the abdominal pain but she has persisted with nausea.  Patient states the nausea used to be daily but now it comes and goes.  She reports she also has a poor appetite.  Patient has occasional pyrosis and regurgitation. Denies dysphagia.  No weight loss. No exposure. No travel.   03/22/2022 colonoscopy, recall 5 years - One 2 mm polyp in the rectum, removed with a cold snare. Resected and retrieved. Path: Surgical [P], colon, rectum, polyp (1) FINDINGS CONSISTENT WITH HYPERPLASTIC POLYP. NO DYSPLASIA OR MALIGNANCY IS SEEN.  Father with colon cancer at age 53. Cousin with colon cancer. Sister with breast cancer. No other family history of colon cancer or polyps   Wt Readings  from Last 3 Encounters:  05/19/24 170 lb 4 oz (77.2 kg)  04/23/24 168 lb 12.8 oz (76.6 kg)  04/09/24 173 lb 3.2 oz (78.6 kg)    Past Medical History:  Diagnosis Date   Allergy Penicilina   Anemia    Anxiety 2021   Asthma    Depression    Dysmenorrhea    Headache(784.0)    Hypertension    Hypoglycemia    possible per pt- becomes shaky and weak when hungry    Past Surgical History:  Procedure Laterality Date   ABDOMINAL HYSTERECTOMY     CESAREAN SECTION     X2   EYE SURGERY     ROBOTIC ASSISTED TOTAL HYSTERECTOMY WITH BILATERAL SALPINGO OOPHERECTOMY Right 05/18/2015   Procedure: ROBOTIC ASSISTED LAPAROSCOPIC  TOTAL HYSTERECTOMY WITH RIGHT SALPINGECTOMY, LYSIS OF ADHESIONS, CYSTOSCOPY;  Surgeon: Elveria Mungo, MD;  Location: [facility];  Service: Gynecology;  Laterality: Right;   TUBAL LIGATION      Current Outpatient Medications  Medication Sig Dispense Refill   albuterol  (VENTOLIN  HFA) 108 (90 Base) MCG/ACT inhaler Inhale 2 puffs into the lungs every 4 (four) hours as needed for wheezing or shortness of breath. 9 g 0   metoprolol  tartrate (LOPRESSOR ) 50 MG tablet TAKE 1 TABLET BY MOUTH ONCE DAILY 30  MINUTES  PRIOR  TO  WORK  SHIFT 90 tablet 3   polyethylene glycol powder (GLYCOLAX /MIRALAX ) 17 GM/SCOOP powder Take 17 g by mouth daily.     No current facility-administered medications for this visit.    Allergies as of 05/19/2024 - Review Complete 05/19/2024  Allergen Reaction Noted  Penicillins Other (See Comments) 05/04/2015    Family History  Problem Relation Age of Onset   Hypertension Mother    Diabetes Mother    Asthma Mother    Hypertension Father    Diabetes Father    Cancer Father    Stroke Father    Breast cancer Sister 8   Breast cancer Paternal Aunt 53   Prostate cancer Paternal Grandfather    Cancer Paternal Grandfather    Colon cancer Cousin        paternal   Diabetes Sister    Anesthesia problems Neg Hx     Review of Systems:     Constitutional: No weight loss, fever, chills, weakness or fatigue HEENT: Eyes: No change in vision               Ears, Nose, Throat:  No change in hearing or congestion Skin: No rash or itching Cardiovascular: No chest pain, chest pressure or palpitations   Respiratory: No SOB or cough Gastrointestinal: See HPI and otherwise negative Genitourinary: No dysuria or change in urinary frequency Neurological: No headache, dizziness or syncope Musculoskeletal: No new muscle or joint pain Hematologic: No bleeding or bruising Psychiatric: No history of depression or anxiety    Physical Exam:  Vital signs: BP 130/80 (BP Location: Left Arm, Patient Position: Sitting, Cuff Size: Normal)   Pulse 68   Ht 5' 1.25 (1.556 m) Comment: height measured without shoes  Wt 170 lb 4 oz (77.2 kg)   LMP  (LMP Unknown) Comment: pt on a pill to stop menses  BMI 31.91 kg/m   Constitutional:   Pleasant female appears to be in NAD, Well developed, Well nourished, alert and cooperative Neck:  Supple Throat: Oral cavity and pharynx without inflammation, swelling or lesion.  Respiratory: Respirations even and unlabored. Lungs clear to auscultation bilaterally.   No wheezes, crackles, or rhonchi.  Cardiovascular: Normal S1, S2. Regular rate and rhythm. No peripheral edema, cyanosis or pallor.  Gastrointestinal:  Soft, nondistended, nontender. No rebound or guarding. Normal bowel sounds. No appreciable masses or hepatomegaly. Rectal:  Not performed.  Msk:  Symmetrical without gross deformities. Without edema, no deformity or joint abnormality.  Neurologic:  Alert and  oriented x4;  grossly normal neurologically.  Skin:   Dry and intact without significant lesions or rashes.  RELEVANT LABS AND IMAGING: CBC    Latest Ref Rng & Units 04/23/2024   10:40 AM 04/09/2024   12:34 PM 01/19/2024    3:18 PM  CBC  WBC 4.0 - 10.5 K/uL 7.5  14.7  11.1   Hemoglobin 12.0 - 15.0 g/dL 86.3  86.2  86.6   Hematocrit 36.0 - 46.0  % 42.0  42.4  41.3   Platelets 150.0 - 400.0 K/uL 390.0  377.0  339      CMP     Latest Ref Rng & Units 04/23/2024   10:40 AM 01/19/2024    3:18 PM 04/26/2023    2:57 PM  CMP  Glucose 70 - 99 mg/dL 899  98  97   BUN 6 - 23 mg/dL 10  10  15    Creatinine 0.40 - 1.20 mg/dL 9.45  9.36  9.30   Sodium 135 - 145 mEq/L 140  135  138   Potassium 3.5 - 5.1 mEq/L 3.7  3.9  4.3   Chloride 96 - 112 mEq/L 101  101  103   CO2 19 - 32 mEq/L 32  25  29   Calcium 8.4 - 10.5  mg/dL 9.4  9.0  9.7   Total Protein 6.0 - 8.3 g/dL 7.7   7.3   Total Bilirubin 0.2 - 1.2 mg/dL 0.5   0.4   Alkaline Phos 39 - 117 U/L 53   55   AST 0 - 37 U/L 21   14   ALT 0 - 35 U/L 18   11      Lab Results  Component Value Date   TSH 1.55 05/24/2022  04/23/2024 CT abdomen pelvis with contrast IMPRESSION: *No acute inflammatory process identified within the abdomen or pelvis. *Multiple other nonacute observations, as described above.  Assessment: Encounter Diagnoses  Name Primary?   Nausea without vomiting Yes   Chronic constipation    Abdominal pain, epigastric      53 year old female patient who presents with intermediate nausea and chronic constipation.  Patient has responded well to daily MiraLAX .  Recommended she continue to take on a scheduled basis.  Patient recently treated for suspected abdominal infection to resolve the abdominal pain but she has continued with nausea. CTAP normal?  Will check thyroid  level, recent level not on file.  Will also start patient on PPI therapy and check H. pylori Diatherix stool test.  We discussed proceeding with upper GI endoscopy however patient would like to hold off on any invasive procedures at this time.  Will revisit at follow-up.    Patient up-to-date on colonoscopy, recall 03/2027.  Plan: - Check TSH today -h pylori diathereix stool test -Start on Omeprazole  20 mg po daily -GERD diet, no late meals  -Continue Miralax  po daily  -recommend high fiber diet -recall  colonoscopy 03/2027   Thank you for the courtesy of this consult. Please call me with any questions or concerns.   Sophiagrace Benbrook, FNP-C [facility] 05/19/2024, 3:41 PM  Cc: Allwardt, Mardy HERO, PA-C

## 2024-05-19 NOTE — Patient Instructions (Addendum)
 Constipation Recommend high fiber diet Continue OTC Miralax  po daily    Epigastric pain, nausea  GERD diet Start Omeprazole  20mg  po daily, 1 tab 30-45 mins before breakfast  Your provider has requested that you go to the basement level for lab work before leaving today. Press B on the elevator. The lab is located at the first door on the left as you exit the elevator.  Your provider has ordered Diatherix stool testing for you. You have received a kit from our office today containing all necessary supplies to complete this test. Please carefully read the stool collection instructions provided in the kit before opening the accompanying materials. In addition, be sure there is a label providing your full name and date of birth on the puritan opti-swab tube that is supplied in the kit (if you do not see a label with this information on your test tube, please make us  aware before test collection!). After completing the test, you should secure the purtian tube into the specimen biohazard bag. The [facility] E-Req sheet (including date and time of specimen collection) should be placed into the outside pocket of the specimen biohazard bag and returned to the [facility] lab (basement floor of [facility]) within 3 days of collection. Please make sure to give the specimen to a staff member at the lab. DO NOT leave the specimen on the counter.   If the specimen date and time (can be found in the upper right boxed portion of the sheet) are not filled out on the E-Req sheet, the test will NOT be performed.    _______________________________________________________  If your blood pressure at your visit was 140/90 or greater, please contact your primary care physician to follow up on this.  _______________________________________________________  If you are age 34 or older, your body mass index should be between 23-30. Your Body mass index is 31.91 kg/m. If this is out of  the aforementioned range listed, please consider follow up with your Primary Care Provider.  If you are age 69 or younger, your body mass index should be between 19-25. Your Body mass index is 31.91 kg/m. If this is out of the aformentioned range listed, please consider follow up with your Primary Care Provider.   ________________________________________________________  The [facility] providers would like to encourage you to use MYCHART to communicate with providers for non-urgent requests or questions.  Due to long hold times on the telephone, sending your provider a message by [facility] may be a faster and more efficient way to get a response.  Please allow 48 business hours for a response.  Please remember that this is for non-urgent requests.  _______________________________________________________  [facility] is using a team-based approach to care.  Your team is made up of your doctor and two to three APPS. Our APPS (Nurse Practitioners and Physician Assistants) work with your physician to ensure care continuity for you. They are fully qualified to address your health concerns and develop a treatment plan. They communicate directly with your gastroenterologist to care for you. Seeing the Advanced Practice Practitioners on your physician's team can help you by facilitating care more promptly, often allowing for earlier appointments, access to diagnostic testing, procedures, and other specialty referrals.   Thank you for trusting me with your gastrointestinal care. Deanna May, NP-C

## 2024-05-20 NOTE — Progress Notes (Signed)
 I agree with the assessment and plan as outlined by Ms. May.

## 2024-05-21 ENCOUNTER — Ambulatory Visit: Payer: Self-pay | Admitting: Gastroenterology

## 2024-06-03 ENCOUNTER — Ambulatory Visit: Admitting: Physician Assistant

## 2024-06-03 VITALS — BP 140/90 | HR 70 | Temp 98.2°F | Ht 61.25 in | Wt 169.0 lb

## 2024-06-03 DIAGNOSIS — I10 Essential (primary) hypertension: Secondary | ICD-10-CM

## 2024-06-03 DIAGNOSIS — N951 Menopausal and female climacteric states: Secondary | ICD-10-CM

## 2024-06-03 DIAGNOSIS — F411 Generalized anxiety disorder: Secondary | ICD-10-CM

## 2024-06-03 MED ORDER — CITALOPRAM HYDROBROMIDE 10 MG PO TABS: 10.0000 mg | ORAL_TABLET | Freq: Every day | ORAL | 1 refills | Status: DC

## 2024-06-03 NOTE — Progress Notes (Signed)
 Patient ID: Debbie [name], female    DOB: 01-09-1971, 53 y.o.   MRN: [id number]   Assessment & Plan:  Generalized anxiety disorder  Menopausal flushing  Other orders -     Citalopram  Hydrobromide; Take 1 tablet (10 mg total) by mouth at bedtime.  Dispense: 30 tablet; Refill: 1      Assessment and Plan Assessment & Plan Menopausal symptoms with associated hot flashes and night sweats Experiencing intermittent menopausal symptoms including hot flashes, night sweats, and a burning sensation, likely due to age and absence of menstruation. Hormone replacement therapy discussed as a potential option, but she lacks a gynecologist and has had a hysterectomy. Celexa  discussed as a treatment option to help with hot flashes and anxiety. - Start Celexa  10 mg daily to help with hot flashes and anxiety - Consider referral to gynecology for hormone replacement therapy if symptoms persist  Generalized anxiety disorder Reports persistent anxiety triggered by minor events, affecting sleep and daily activities. Previously prescribed Xanax  for acute panic episodes but advised against daily use due to dependency risk. Celexa  expected to help with anxiety, sleep, and hot flashes. Hormone therapy may be considered if Celexa  is ineffective. - Start Celexa  10 mg daily for anxiety - Advise against using Xanax  daily; reserve for acute panic episodes  Insomnia Experiencing difficulty sleeping, potentially related to anxiety and menopausal symptoms. Celexa  discussed as a treatment option to help with sleep. - Start Celexa  10 mg daily to aid with sleep  Essential hypertension Blood pressure is elevated, likely due to missing a dose of antihypertensive medication. - Advise to take missed antihypertensive medication dose      Return in about 6 weeks (around 07/15/2024) for recheck/follow-up.    Subjective:    Chief Complaint  Patient presents with   Anxiety    Pt in office for anxiety  follow up;     Anxiety     Discussed the use of AI scribe software for clinical note transcription with the patient, who gave verbal consent to proceed.  History of Present Illness Debbie [name] is a 53 year old female who presents with anxiety and menopausal symptoms.  She experiences persistent anxiety characterized by a constant sense of fear and nervousness. Minor triggers, such as a dog barking, exacerbate her anxiety. She worries excessively about her family, especially when they are away for extended periods. She has difficulty sleeping and has stopped using her tablet and watching scary movies to avoid worsening her anxiety. She has a history of being prescribed Xanax  for panic episodes but avoids regular use due to concerns about dependency.  She is experiencing menopausal symptoms, including hot flashes and night sweats. The hot flashes feel like a 'burn' lasting a few minutes and occur intermittently. She has not had menstrual periods for a long time. She reports feeling 'so hot' and sweating, attributing these symptoms to menopause.  She expresses concern about her health, fearing serious illnesses like cancer when experiencing pain. She recalls a past episode where she was convinced she had cancer due to abdominal pain. She notes a decrease in appetite and weight loss, which she attributes to anxiety and stress. She was previously prescribed Lexapro  but did not take it due to a dislike of daily medication.  She mentions her blood pressure is high today because she forgot to take her medication.     Past Medical History:  Diagnosis Date   Allergy Penicilina   Anemia    Anxiety 2021   Asthma  Depression    Dysmenorrhea    Headache(784.0)    Hypertension    Hypoglycemia    possible per pt- becomes shaky and weak when hungry    Past Surgical History:  Procedure Laterality Date   ABDOMINAL HYSTERECTOMY     CESAREAN SECTION     X2   EYE SURGERY      ROBOTIC ASSISTED TOTAL HYSTERECTOMY WITH BILATERAL SALPINGO OOPHERECTOMY Right 05/18/2015   Procedure: ROBOTIC ASSISTED LAPAROSCOPIC  TOTAL HYSTERECTOMY WITH RIGHT SALPINGECTOMY, LYSIS OF ADHESIONS, CYSTOSCOPY;  Surgeon: Elveria Mungo, MD;  Location: [facility];  Service: Gynecology;  Laterality: Right;   TUBAL LIGATION      Family History  Problem Relation Age of Onset   Hypertension Mother    Diabetes Mother    Asthma Mother    Hypertension Father    Diabetes Father    Cancer Father    Stroke Father    Breast cancer Sister 67   Breast cancer Paternal Aunt 40   Prostate cancer Paternal Grandfather    Cancer Paternal Grandfather    Colon cancer Cousin        paternal   Diabetes Sister    Anesthesia problems Neg Hx     Social History   Tobacco Use   Smoking status: Never   Smokeless tobacco: Never  Vaping Use   Vaping status: Never Used  Substance Use Topics   Alcohol use: Yes    Comment: occ   Drug use: No     Allergies  Allergen Reactions   Penicillins Other (See Comments)    Not sure if she has true allergy to Penicillin but her father told her that she or one of her sisters had a reaction.    Review of Systems NEGATIVE UNLESS OTHERWISE INDICATED IN HPI      Objective:     BP (!) 140/90 (BP Location: Right Arm, Patient Position: Sitting, Cuff Size: Normal)   Pulse 70   Temp 98.2 F (36.8 C) (Temporal)   Ht 5' 1.25 (1.556 m)   Wt 169 lb (76.7 kg)   LMP  (LMP Unknown) Comment: pt on a pill to stop menses  SpO2 96%   BMI 31.67 kg/m   Wt Readings from Last 3 Encounters:  06/03/24 169 lb (76.7 kg)  05/19/24 170 lb 4 oz (77.2 kg)  04/23/24 168 lb 12.8 oz (76.6 kg)    BP Readings from Last 3 Encounters:  06/03/24 (!) 140/90  05/19/24 130/80  04/23/24 118/76     Physical Exam Vitals and nursing note reviewed.  Constitutional:      Appearance: Normal appearance.  Cardiovascular:     Rate and Rhythm: Normal rate and regular rhythm.      Pulses: Normal pulses.     Heart sounds: Normal heart sounds. No murmur heard. Pulmonary:     Effort: Pulmonary effort is normal.     Breath sounds: Normal breath sounds.  Neurological:     General: No focal deficit present.     Mental Status: She is alert and oriented to person, place, and time.  Psychiatric:        Mood and Affect: Mood normal.        Behavior: Behavior normal.        Thought Content: Thought content normal.        Judgment: Judgment normal.             Jackeline Gutknecht M Glorianne Proctor, PA-C

## 2024-06-03 NOTE — Patient Instructions (Signed)
 Please work on the following to help with sleep:  -Sleep only long enough to feel rested then get out of bed -Go to bed and get up at the same time every day. -Do not try to force yourself to sleep. If you can't sleep, get out of bed adn try again later. -Have coffee, tea, and other foods that have caffeine only in the morning. -Avoid alcohol -Keep your bedroom dark, cool, quiet, and free of reminders of work or other things that cause you stress -Exercise several days a week, but not right before bed -Avoid looking at phones or reading devices (e-books) that give off light before bed. This can make it harder to fall asleep    VISIT SUMMARY: Today, we discussed your ongoing anxiety, menopausal symptoms, and high blood pressure. We have started a new medication to help manage your symptoms and discussed potential future treatments.  YOUR PLAN: MENOPAUSAL SYMPTOMS: You are experiencing hot flashes and night sweats due to menopause. -Start taking Celexa  10 mg daily to help with hot flashes and anxiety. -Consider seeing a gynecologist for hormone replacement therapy if symptoms persist.  GENERALIZED ANXIETY DISORDER: You have persistent anxiety that affects your daily life and sleep. -Start taking Celexa  10 mg daily for anxiety. -Avoid using Xanax  daily; only use it for acute panic episodes.  INSOMNIA: You have difficulty sleeping, likely related to anxiety and menopausal symptoms. -Start taking Celexa  10 mg daily to help with sleep.  ESSENTIAL HYPERTENSION: Your blood pressure is high because you missed a dose of your medication. -Take your missed dose of antihypertensive medication.                      Contains text generated by Abridge.                                 Contains text generated by Abridge.

## 2024-06-24 ENCOUNTER — Encounter: Payer: Self-pay | Admitting: Gastroenterology

## 2024-06-30 ENCOUNTER — Encounter: Payer: Self-pay | Admitting: Gastroenterology

## 2024-07-27 ENCOUNTER — Other Ambulatory Visit: Payer: Self-pay | Admitting: Physician Assistant

## 2024-07-28 NOTE — Telephone Encounter (Signed)
 Medication shows as expired, okay to refill?

## 2024-08-04 ENCOUNTER — Telehealth: Payer: Self-pay | Admitting: Physician Assistant

## 2024-08-04 NOTE — Telephone Encounter (Signed)
 LVM for Patient to be Triaged

## 2024-08-10 ENCOUNTER — Other Ambulatory Visit: Payer: Self-pay | Admitting: Physician Assistant

## 2024-08-10 DIAGNOSIS — R002 Palpitations: Secondary | ICD-10-CM

## 2024-08-12 ENCOUNTER — Ambulatory Visit: Attending: Physician Assistant

## 2024-08-12 ENCOUNTER — Ambulatory Visit: Admitting: Physician Assistant

## 2024-08-12 ENCOUNTER — Encounter: Payer: Self-pay | Admitting: Physician Assistant

## 2024-08-12 VITALS — BP 120/82 | HR 68 | Temp 97.9°F | Ht 61.0 in | Wt 166.0 lb

## 2024-08-12 DIAGNOSIS — I499 Cardiac arrhythmia, unspecified: Secondary | ICD-10-CM | POA: Diagnosis not present

## 2024-08-12 DIAGNOSIS — R002 Palpitations: Secondary | ICD-10-CM

## 2024-08-12 NOTE — Progress Notes (Unsigned)
 EP to read.

## 2024-08-12 NOTE — Progress Notes (Signed)
 Patient ID: Debbie [name], female    DOB: 1971/05/26, 53 y.o.   MRN: [id number]   Assessment & Plan:  Irregular heart rate -     LONG TERM MONITOR (3-14 DAYS); Future  Palpitations -     LONG TERM MONITOR (3-14 DAYS); Future     Assessment & Plan Irregular heart rate and palpitations (including bradycardia) Reports episodes of bradycardia, particularly at rest, with associated chest discomfort. Symptoms began last week with heart rate dropping below 45 bpm, especially when inactive. No family history of heart disease, but family history of hypertension. Currently on metoprolol , which may contribute to bradycardia. Differential includes arrhythmias such as atrial fibrillation or flutter. No symptoms of stress or anxiety. Previous labs normal, suggesting no underlying metabolic cause. - Discontinue metoprolol  for one week to assess impact on heart rate. - Order Zio patch to monitor heart rhythm for up to two weeks. - Instruct to press the button on the Zio patch if experiencing abnormal heart sensations. - Advise to report any significant chest pain or discomfort. - Schedule follow-up appointment in four weeks.      Return in about 4 weeks (around 09/09/2024) for recheck/follow-up.    Subjective:    Chief Complaint  Patient presents with   Bradycardia    Watch is stating that her heart rate is low. Gets down to 48. Is feeling chest pain when it gets that low. Feels like something is stuck in chest. Also having some right sided jaw and arm pain once or twice a week.     HPI Discussed the use of AI scribe software for clinical note transcription with the patient, who gave verbal consent to proceed.  History of Present Illness Debbie [name] is a 53 year old female with hypertension who presents with concerns of low heart rate and chest discomfort.  She experiences a low heart rate, particularly when inactive, such as sitting on the sofa watching TV. Her  heart rate drops below 45 beats per minute, which is below the threshold that triggers her watch's alert. This issue began last week and has persisted into this week.  She describes a sensation in her chest akin to 'when you drink something and it stops right there,' accompanied by occasional pain. This discomfort sometimes radiates to her shoulder. She associates these symptoms with the times her heart rate drops.  She is currently taking metoprolol , 50 mg once daily in the morning, which was initially prescribed to manage anxiety and a previously high heart rate. She mentions that she no longer feels stressed at work and is able to perform her daily activities, such as working and cleaning her house, without issue.  She has no personal or family history of heart disease, although there is a family history of hypertension. She monitors her heart rate using a watch and notes that while walking, her heart rate can reach up to 100 beats per minute. She is unsure about the accuracy of her watch readings, especially when it shows heart rates in the 30s during sleep.     Past Medical History:  Diagnosis Date   Allergy Penicilina   Anemia    Anxiety 2021   Asthma    Depression    Dysmenorrhea    Headache(784.0)    Hypertension    Hypoglycemia    possible per pt- becomes shaky and weak when hungry    Past Surgical History:  Procedure Laterality Date   ABDOMINAL HYSTERECTOMY     CESAREAN SECTION  X2   EYE SURGERY     ROBOTIC ASSISTED TOTAL HYSTERECTOMY WITH BILATERAL SALPINGO OOPHERECTOMY Right 05/18/2015   Procedure: ROBOTIC ASSISTED LAPAROSCOPIC  TOTAL HYSTERECTOMY WITH RIGHT SALPINGECTOMY, LYSIS OF ADHESIONS, CYSTOSCOPY;  Surgeon: Elveria Mungo, MD;  Location: [facility];  Service: Gynecology;  Laterality: Right;   TUBAL LIGATION      Family History  Problem Relation Age of Onset   Hypertension Mother    Diabetes Mother    Asthma Mother    Hypertension Father    Diabetes  Father    Cancer Father    Stroke Father    Breast cancer Sister 90   Breast cancer Paternal Aunt 65   Prostate cancer Paternal Grandfather    Cancer Paternal Grandfather    Colon cancer Cousin        paternal   Diabetes Sister    Anesthesia problems Neg Hx     Social History   Tobacco Use   Smoking status: Never   Smokeless tobacco: Never  Vaping Use   Vaping status: Never Used  Substance Use Topics   Alcohol use: Yes    Comment: occ   Drug use: No     Allergies  Allergen Reactions   Penicillins Other (See Comments)    Not sure if she has true allergy to Penicillin but her father told her that she or one of her sisters had a reaction.    Review of Systems NEGATIVE UNLESS OTHERWISE INDICATED IN HPI      Objective:     BP 120/82 (BP Location: Left Arm, Patient Position: Sitting, Cuff Size: Normal)   Pulse 68   Temp 97.9 F (36.6 C) (Tympanic)   Ht 5' 1 (1.549 m)   Wt 166 lb (75.3 kg)   LMP  (LMP Unknown) Comment: pt on a pill to stop menses  SpO2 98%   BMI 31.37 kg/m   Wt Readings from Last 3 Encounters:  08/12/24 166 lb (75.3 kg)  06/03/24 169 lb (76.7 kg)  05/19/24 170 lb 4 oz (77.2 kg)    BP Readings from Last 3 Encounters:  08/12/24 120/82  06/03/24 (!) 140/90  05/19/24 130/80     Physical Exam Vitals and nursing note reviewed.  Constitutional:      Appearance: Normal appearance.  Cardiovascular:     Rate and Rhythm: Normal rate and regular rhythm.     Pulses: Normal pulses.     Heart sounds: Normal heart sounds. No murmur heard. Pulmonary:     Effort: Pulmonary effort is normal.     Breath sounds: Normal breath sounds.  Neurological:     General: No focal deficit present.     Mental Status: She is alert and oriented to person, place, and time.  Psychiatric:        Mood and Affect: Mood normal.        Behavior: Behavior normal.        Thought Content: Thought content normal.        Judgment: Judgment normal.              Edmar Blankenburg M Davie Sagona, PA-C

## 2024-08-12 NOTE — Patient Instructions (Signed)
  VISIT SUMMARY: Today we discussed your concerns about a low heart rate and chest discomfort. We reviewed your symptoms and current medication, and made a plan to monitor your heart rhythm and assess the impact of discontinuing metoprolol .  YOUR PLAN: IRREGULAR HEART RATE AND PALPITATIONS (INCLUDING BRADYCARDIA): You have been experiencing episodes of low heart rate, especially when inactive, along with chest discomfort. This may be related to your current medication, metoprolol . -Stop taking metoprolol  for one week to see if your heart rate improves. -We will use a Zio patch to monitor your heart rhythm for up to two weeks. Press the button on the patch if you feel any abnormal heart sensations. -Report any significant chest pain or discomfort immediately. -Schedule a follow-up appointment in four weeks to review your symptoms and test results.                      Contains text generated by [employer].                                 Contains text generated by [employer].

## 2024-09-09 ENCOUNTER — Encounter: Payer: Self-pay | Admitting: Physician Assistant

## 2024-09-09 ENCOUNTER — Ambulatory Visit: Admitting: Physician Assistant

## 2024-09-09 VITALS — BP 134/84 | HR 72 | Temp 98.2°F | Ht 61.0 in | Wt 167.4 lb

## 2024-09-09 DIAGNOSIS — K5909 Other constipation: Secondary | ICD-10-CM | POA: Diagnosis not present

## 2024-09-09 DIAGNOSIS — F411 Generalized anxiety disorder: Secondary | ICD-10-CM | POA: Diagnosis not present

## 2024-09-09 DIAGNOSIS — R002 Palpitations: Secondary | ICD-10-CM | POA: Diagnosis not present

## 2024-09-09 NOTE — Progress Notes (Signed)
 Patient ID: Debbie [name], female    DOB: 04-14-1971, 53 y.o.   MRN: [id number]   Assessment & Plan:  Chronic constipation  Palpitations  Generalized anxiety disorder      Assessment and Plan Assessment & Plan Palpitations and bradycardia, resolved after discontinuation of metoprolol  Palpitations and bradycardia resolved after discontinuation of metoprolol . Preliminary Zio patch results show normal sinus rhythm with no concerning arrhythmias. Symptoms improved after stopping metoprolol , and she reports feeling better.  Constipation Managed with Miralax , taken every two days. She reports improved bowel movements and increased water  intake. - Continue Miralax  as needed for constipation  GAD Doing better She stopped the celexa  on her own Will call if any anxiety arises again   General Health Maintenance Discussion about scheduling a physical exam and blood work for next year. She prefers to have the blood work done in the morning or with a six-hour fasting period if in the afternoon. - Schedule physical exam and blood work for next year - Ensure fasting for blood work, either in the morning or with a six-hour fasting period if in the afternoon      Return in about 6 months (around 03/09/2025) for physical with fasting labs .    Subjective:    Chief Complaint  Patient presents with   Follow-up    Here for a 4 week follow-up after wearing a heart monitor for two weeks. Stopped Blood pressure per last appointment and feels good.     HPI Discussed the use of AI scribe software for clinical note transcription with the patient, who gave verbal consent to proceed.  History of Present Illness Debbie [name] is a 53 year old female who presents for a four week follow-up visit regarding low heart rate and chest discomfort.  She initially experienced a low heart rate, sometimes reaching around 45 beats per minute, accompanied by chest discomfort. She was  taking metoprolol  50 mg once in the morning, which was initially prescribed to manage her anxiety and previously high heart rate. After stopping metoprolol , she felt better.  She had been taking citalopram  (Celexa ) but discontinued it due to excessive sleepiness. She currently feels normal and does not feel the need for the medication. She monitors her blood pressure regularly and avoids salt and fried foods, noting occasional headaches after consuming salty foods.  For constipation, she uses Miralax , now every two days instead of daily, and has increased her water  intake. She feels good overall and is preparing for the holidays with her family.     Past Medical History:  Diagnosis Date   Allergy Penicilina   Anemia    Anxiety 2021   Asthma    Depression    Dysmenorrhea    Headache(784.0)    Hypertension    Hypoglycemia    possible per pt- becomes shaky and weak when hungry    Past Surgical History:  Procedure Laterality Date   ABDOMINAL HYSTERECTOMY     CESAREAN SECTION     X2   EYE SURGERY     ROBOTIC ASSISTED TOTAL HYSTERECTOMY WITH BILATERAL SALPINGO OOPHERECTOMY Right 05/18/2015   Procedure: ROBOTIC ASSISTED LAPAROSCOPIC  TOTAL HYSTERECTOMY WITH RIGHT SALPINGECTOMY, LYSIS OF ADHESIONS, CYSTOSCOPY;  Surgeon: Elveria Mungo, MD;  Location: [facility];  Service: Gynecology;  Laterality: Right;   TUBAL LIGATION      Family History  Problem Relation Age of Onset   Hypertension Mother    Diabetes Mother    Asthma Mother    Hypertension Father  Diabetes Father    Cancer Father    Stroke Father    Breast cancer Sister 56   Breast cancer Paternal Aunt 72   Prostate cancer Paternal Grandfather    Cancer Paternal Grandfather    Colon cancer Cousin        paternal   Diabetes Sister    Anesthesia problems Neg Hx     Social History   Tobacco Use   Smoking status: Never   Smokeless tobacco: Never  Vaping Use   Vaping status: Never Used  Substance Use Topics    Alcohol use: Yes    Comment: occ   Drug use: No     Allergies  Allergen Reactions   Penicillins Other (See Comments)    Not sure if she has true allergy to Penicillin but her father told her that she or one of her sisters had a reaction.    Review of Systems NEGATIVE UNLESS OTHERWISE INDICATED IN HPI      Objective:     BP 134/84   Pulse 72   Temp 98.2 F (36.8 C) (Temporal)   Ht 5' 1 (1.549 m)   Wt 167 lb 6.4 oz (75.9 kg)   LMP  (LMP Unknown) Comment: pt on a pill to stop menses  SpO2 99%   BMI 31.63 kg/m   Wt Readings from Last 3 Encounters:  09/09/24 167 lb 6.4 oz (75.9 kg)  08/12/24 166 lb (75.3 kg)  06/03/24 169 lb (76.7 kg)    BP Readings from Last 3 Encounters:  09/09/24 134/84  08/12/24 120/82  06/03/24 (!) 140/90     Physical Exam Vitals and nursing note reviewed.  Constitutional:      Appearance: Normal appearance.  Cardiovascular:     Rate and Rhythm: Normal rate and regular rhythm.     Pulses: Normal pulses.     Heart sounds: Normal heart sounds. No murmur heard. Pulmonary:     Effort: Pulmonary effort is normal.     Breath sounds: Normal breath sounds.  Neurological:     General: No focal deficit present.     Mental Status: She is alert and oriented to person, place, and time.  Psychiatric:        Mood and Affect: Mood normal.        Behavior: Behavior normal.        Thought Content: Thought content normal.        Judgment: Judgment normal.             Debbie Rasmussen M Tyreek Clabo, PA-C

## 2024-09-26 ENCOUNTER — Ambulatory Visit: Payer: Self-pay | Admitting: Physician Assistant

## 2024-09-26 DIAGNOSIS — I499 Cardiac arrhythmia, unspecified: Secondary | ICD-10-CM

## 2024-09-26 DIAGNOSIS — R002 Palpitations: Secondary | ICD-10-CM

## 2024-10-23 ENCOUNTER — Other Ambulatory Visit: Payer: Self-pay

## 2024-10-23 ENCOUNTER — Other Ambulatory Visit: Payer: Self-pay | Admitting: Physician Assistant

## 2025-01-30 ENCOUNTER — Encounter: Admitting: Physician Assistant
